# Patient Record
Sex: Female | Born: 1975
Health system: Southern US, Community
[De-identification: ages and names within clinical notes are randomized; demographics above are authoritative.]

## PROBLEM LIST (undated history)

## (undated) DIAGNOSIS — I639 Cerebral infarction, unspecified: Secondary | ICD-10-CM

## (undated) DIAGNOSIS — E119 Type 2 diabetes mellitus without complications: Secondary | ICD-10-CM

## (undated) DIAGNOSIS — R140 Abdominal distension (gaseous): Secondary | ICD-10-CM

## (undated) DIAGNOSIS — N63 Unspecified lump in unspecified breast: Secondary | ICD-10-CM

## (undated) DIAGNOSIS — IMO0002 Reserved for concepts with insufficient information to code with codable children: Secondary | ICD-10-CM

## (undated) DIAGNOSIS — E079 Disorder of thyroid, unspecified: Secondary | ICD-10-CM

## (undated) HISTORY — DX: Disorder of thyroid, unspecified: E07.9

## (undated) HISTORY — DX: Reserved for concepts with insufficient information to code with codable children: IMO0002

## (undated) HISTORY — DX: Abdominal distension (gaseous): R14.0

## (undated) HISTORY — DX: Unspecified lump in unspecified breast: N63.0

## (undated) HISTORY — DX: Type 2 diabetes mellitus without complications: E11.9

---

## 2003-09-04 ENCOUNTER — Inpatient Hospital Stay (HOSPITAL_COMMUNITY): Admission: RE | Admit: 2003-09-04 | Discharge: 2003-09-06 | Payer: Self-pay | Admitting: Obstetrics and Gynecology

## 2004-03-10 ENCOUNTER — Observation Stay (HOSPITAL_COMMUNITY): Admission: EM | Admit: 2004-03-10 | Discharge: 2004-03-11 | Payer: Self-pay | Admitting: Emergency Medicine

## 2004-07-31 ENCOUNTER — Emergency Department (HOSPITAL_COMMUNITY): Admission: EM | Admit: 2004-07-31 | Discharge: 2004-07-31 | Payer: Self-pay | Admitting: Emergency Medicine

## 2007-08-19 ENCOUNTER — Other Ambulatory Visit: Admission: RE | Admit: 2007-08-19 | Discharge: 2007-08-19 | Payer: Self-pay | Admitting: Obstetrics & Gynecology

## 2011-04-05 NOTE — H&P (Signed)
NAME:  Melissa Wilkerson, Melissa Wilkerson                        ACCOUNT NO.:  [id number]   MEDICAL RECORD NO.:  [id number]                   PATIENT TYPE:  INP   LOCATION:  A427                                 FACILITY:  [facility]   PHYSICIAN:  Langley Gauss, M.D.                DATE OF BIRTH:  Jun 12, 1976   DATE OF ADMISSION:  09/04/2003  DATE OF DISCHARGE:                                HISTORY & PHYSICAL   HISTORY OF PRESENT ILLNESS:  This is a 35 year old gravida 2, para 1, at [redacted]  weeks gestation, who presents to [facility] in early stages of  active labor.  Initial examination by Roxine Caddy, R.N., reveals cervix  to be 3-4 cm dilated.   PRENATAL COURSE:  She received prenatal care through [facility].  GTT is normal.  GBS carrier status is negative.  The patient is noted to  have positive antibody screen on her type and Rh.  Note indicates that titer  was performed and was noted to be 1:1 and it appears as though, the chart  indicates it was anti-E, and is not clinically significant.  This was again  repeated during her late second trimester and was unchanged.  She is O  Negative blood type.  She did receive RhoGAM appropriately during the  pregnancy.   ALLERGIES:  No known drug allergies.   CURRENT MEDICATIONS:  Prenatal vitamins.   OB HISTORY:  Vaginal delivery x1 in 1996, 7 pound infant, delivered by  Langley Gauss, M.D. in cross coverage for Richardean Canal, M.D.   PHYSICAL EXAMINATION:  GENERAL:  A pleasant white female.  VITAL SIGNS:  Blood pressure 137/72, pulse 98, respiratory rate 18,  temperature 97.9.  HEENT:  Negative.  No adenopathy.  NECK:  Supple, thyroid is nonpalpable.  LUNGS:  Clear.  CARDIOVASCULAR:  Regular rate and rhythm.  ABDOMEN:  Soft and nontender.  No surgical scars are identified.  She is  vertex presentation by Leopold's maneuver with a fundal height of 40 cm.  EXTREMITIES:  Normal with only trace edema.  PELVIC:  Normal external genitalia.   No lesions or ulcerations identified.  No vaginal bleeding, no leakage of fluid.  The patient admitted in early  stage of labor.  Cervix at 3-4 cm, dilated.  Pelvis noted to be clinically  adequate.  External fetal monitor reveals uterine contractions q3-6 minutes,  palpably mild intensity.  Fetal heart rate is reassuring.    ASSESSMENT:  A 41 week intrauterine pregnancy, in early stages of labor.  The patient is undecided regarding plans for analgesia during labor.  She  would like to try IV narcotics first.  Thus, order is written for Nubain and  Phenergan for first dose upon patient's request.      ___________________________________________  Langley Gauss, M.D.   DC/MEDQ  D:  09/04/2003  T:  09/05/2003  Job:  [id number]   cc:   [facility]

## 2011-04-05 NOTE — H&P (Signed)
NAME:  Melissa Wilkerson, Melissa Wilkerson                        ACCOUNT NO.:  [id number]   MEDICAL RECORD NO.:  [id number]                   PATIENT TYPE:  EMS   LOCATION:  ED                                   FACILITY:  [facility]   PHYSICIAN:  Edward L. Juanetta Gosling, M.D.             DATE OF BIRTH:  Oct 21, 1976   DATE OF ADMISSION:  03/10/2004  DATE OF DISCHARGE:                                HISTORY & PHYSICAL   REASON FOR ADMISSION:  Pyelonephritis.   HISTORY:  Melissa Wilkerson is a 35 year old who was in her usual state of fairly  good health when she developed fever, about 36 hours ago.  She has had a lot  of urinary symptoms for longer than that.  When she came to the emergency  room, she was noted to have a temp of 102.  She was tachycardic.  Her urine  was positive for nitrites and positive for leukocyte esterase.  Her white  count was 22,200, hemoglobin 14.  She had 98% neutrophils.  It was felt that  she had pyelonephritis, and she was admitted because of that.   PAST MEDICAL HISTORY:  Essentially negative.  She had a miscarriage about  two months ago.  She has a history of allergies in the past but no other  significant medical history.  She denies any history of diabetes or other  medical problems.   SOCIAL HISTORY:  She smokes about a half pack to a pack of cigarettes daily.  She works in a Education officer, environmental.   FAMILY HISTORY:  Mother has diabetes and hypertension.  No known history of  any sort of renal problems in the family.   PHYSICAL EXAMINATION:  VITAL SIGNS:  Temp 102.3, pulse 120.  HEENT:  Mucous membranes are dry.  Nose and throat are clear.  LUNGS:  Clear without wheezes, rales, or rhonchi.  HEART:  Regular without murmur, rub or gallop.  ABDOMEN:  Soft without masses.  She actually does not have any percussion  tenderness in the costovertebral angle.   LAB WORK:  Penelope Coop much as mentioned.  Her urine microscopic shows too-  numerous-to-count white blood cells, too-numerous-to-count red  cells, and  many bacteria.   ASSESSMENT:  She has probably pyelonephritis.   PLAN:  Admit for IV treatment.     ___________________________________________                                         Oneal Deputy. Juanetta Gosling, M.D.   ELH/MEDQ  D:  03/10/2004  T:  03/10/2004  Job:  [id number]

## 2011-04-05 NOTE — Discharge Summary (Signed)
NAME:  Melissa Wilkerson, Melissa Wilkerson                        ACCOUNT NO.:  [id number]   MEDICAL RECORD NO.:  [id number]                   PATIENT TYPE:  INP   LOCATION:  A304                                 FACILITY:  [facility]   PHYSICIAN:  Edward L. Juanetta Gosling, M.D.             DATE OF BIRTH:  29-Jan-1976   DATE OF ADMISSION:  03/10/2004  DATE OF DISCHARGE:  03/11/2004                                 DISCHARGE SUMMARY   FINAL DIAGNOSIS:  Pyelonephritis.   HISTORY:  This is a 35 year old who developed fever about 36 hours prior to  admission, but had been having urinary symptoms with dysuria for about a  week prior to that.  When she came to the emergency room she had a  temperature of 102.  She was tachycardic.  Urine was positive for nitrites  and leukocyte esterase.  White count was 22,000 with 98% neutrophils and she  was admitted because of all that.   Exam on admission showed that her temperature was 102.3, pulse 120.  Her  mucous membranes were dry. Chest was clear.  She did not have any percussion  tenderness in the CVT angle.  Urine showed too numerous to count white blood  cells, too numerous to count red cells and many bacteria.   HOSPITAL COURSE:  She was thought to have pyelonephritis and was admitted  for that.  Blood cultures are drawn and are pending.  Urine cultures also  pending now.  She was treated with intravenous Cipro and improved markedly  over the next 24 hours.  Her white count went down to 14,000 and she was  able to eat, keep her food down.  Her mucous membranes moistened up and she  wanted to go home.  She was discharged home in much improved condition on  Cipro 500 mg b.i.d. for 10 more days.  She is going to follow up with Dr.  Sudie Bailey in his office.     ___________________________________________                                         Oneal Deputy. Juanetta Gosling, M.D.   ELH/MEDQ  D:  03/11/2004  T:  03/11/2004  Job:  [id number]

## 2011-04-05 NOTE — Group Therapy Note (Signed)
NAME:  Melissa, Wilkerson                        ACCOUNT NO.:  [id number]   MEDICAL RECORD NO.:  [id number]                   PATIENT TYPE:  INP   LOCATION:  A304                                 FACILITY:  [facility]   PHYSICIAN:  Edward L. Juanetta Gosling, M.D.             DATE OF BIRTH:  1975-12-25   DATE OF PROCEDURE:  DATE OF DISCHARGE:                                   PROGRESS NOTE   PROBLEM:  Pyelonephritis.   SUBJECTIVE:  Melissa Wilkerson says she feels much better and wants to go home.  She has no new complaints.   OBJECTIVE:  Temperature 100.2, pulse 83, respirations 18, blood pressure  116/67.  Chest is clear.  Heart is regular.  Lab work shows that her white  blood count now is down to 14,500 from 22,200 yesterday.  She has gone from  98% neutrophils to 80% neutrophils.   ASSESSMENT:  She is much improved.   PLAN:  Discharge home today.  Please see discharge summary for details.      ___________________________________________                                            Oneal Deputy Juanetta Gosling, M.D.   ELH/MEDQ  D:  03/11/2004  T:  03/11/2004  Job:  [id number]

## 2011-04-05 NOTE — Op Note (Signed)
NAME:  Melissa Wilkerson, TREAT                        ACCOUNT NO.:  [id number]   MEDICAL RECORD NO.:  [id number]                   PATIENT TYPE:  INP   LOCATION:  A427                                 FACILITY:  [facility]   PHYSICIAN:  Langley Gauss, M.D.                DATE OF BIRTH:  [birth year]   DATE OF PROCEDURE:  09/04/2003  DATE OF DISCHARGE:                                 OPERATIVE REPORT   DELIVERY NOTE   DATE OF DELIVERY:  September 04, 2003   DIAGNOSES:  1. A 41-week intrauterine pregnancy in labor.  2. Dysfunctional labor pattern with inadequate frequency and intensity of     uterine contractions requiring Pitocin augmentation.   DELIVERY PERFORMED:  1. Spontaneous assisted vaginal delivery of an 8 pound 8 ounce female.  2. Midline episiotomy repair.   Delivery performed by Dr. Roylene Reason. Lisette Grinder.   ANALGESIA:  The patient received IV Nubain and Phenergan during the course  of labor. She decline placement of an epidural  At the time of delivery, 30  cc of 1% lidocaine plain is utilized in the midline of the perineal body to  facilitate repair.   TOTAL ESTIMATED BLOOD LOSS:  Less than 500 cc.   SPECIMENS:  1. Arterial cord gas and cord blood to pathology laboratory.  2. Placenta is examined and noted to be apparently intake with a 3-vessel     umbilical cord.   DELIVERY PERFORMED:  1. Outlet vacuum extraction utilizing Kiwi vacuum extractor as the patient     was noted to have decelerations to the 80 to 90 beats/minute with the     infant's vertex on the perineal floor.   SUMMARY:  Patient admitted in active labor.  Initial examination 3-4 cm  dilated. The patient received IV Nubain and Phenergan which markedly slowed  the uterine contractions.  By the early a.m. of September 04, 2003 the patient  was examined and now noted to have progressed to 7 cm dilated, 80% effaced,  0 station with the vertex presentation confirmed bulging intact membranes.  Amniotomy is performed with  findings of clear amniotic fluid and fetal scalp  electrode is placed.  The patient is noted to have a reassuring fetal heart  rate at this time.  Subsequently she did have increased frequency and  intensity of uterine contractions, but made slow change throughout the day.  I was contacted by the nursing staff stating that she was 8 cm dilated, and  requesting additional pain medication.   At that point in time, I assessed the patient.  Contractions were palpably  mild and somewhat irregular, thus an intrauterine pressure catheter is  placed to document uterine adequacy.  It was determined that the patient  would benefit from Pitocin augmentation.  Thereafter, the patient was noted  to progress to 9+ cm dilatation; it appeared that the cervix would easily  reduce if the patient would tolerate  this examination.  She was having  significant discomfort associated with the uterine contractions and an  almost uncontrolled urge to push.  Thus, the patient is placed in the dorsal  lithotomy position and prepped and draped in the usual sterile manner.   She began pushing with uterine contractions and it was very easy to reduce  the anterior lip of the cervix over the head of the infant to result in  complete dilatation.  Examination revealed the infant to be in the LOT  position.  With subsequent expulsive efforts spontaneous rotation of the  vertex was noted to occur with descent of the vertex to the perineal floor.  The vertex was noted in a direct OA position.   At this point in time she did begin having the fetal heart rate  decelerations at 80-90 beats/minute.  Thus, with the vertex at a +3 station.  Kiwi vacuum extractor was utilized.  Pressure was kept within the safe gree  range. Suction is applied, and with gentle traction there is very easy  descent of the vertex.  A total of 30 cc of 1% lidocaine is utilized in the  midline of the perineal body.  A small midline episiotomy is  performed.  The  infant is delivered in a direct OA position over this midline episiotomy  without extension.  The mouth and nares were bulb suctioned of clear  amniotic fluid.  Spontaneous rotation then occurred to a right anterior  shoulder position.  Gentle downward traction combined with expulsive efforts  resulted in delivery of this anterior shoulder beneath the pubic symphysis  without difficulty.  The remainder of the infant also delivered without  difficulty.  The umbilical cord is milked towards the infant.  The cord is  doubly clamped, and cut.  The infant is noted to have a spontaneous and  vigorous breathing cry.  Arterial cord gases and cord blood are then  obtained from the placenta.   Gentle traction of the placenta of the placenta results in a separation and  delivery which, upon examination, appears to be an intact placenta with  associated 3-vessel umbilical cord.  Examination of the genital tract  reveals no laceration.  The midline episiotomy is not extended. Excellent  uterine tone is achieved with IV Pitocin solution.   Repair is then performed utilizing #0 chromic in a running lock fashion on  the vaginal mucosa followed by deep interrupted suture of 2-0 Vicryl on the  perineal body to reapproximate the muscular layer followed by a superficial  continuous running layer of #0 chromic to reapproximate the perineal skin.  Both mother and infant doing well following delivery.  The patient is taken  out of the lithotomy position and allowed to bond with the infant.      ___________________________________________                                            Langley Gauss, M.D.   DC/MEDQ  D:  09/04/2003  T:  09/05/2003  Job:  [id number]   cc:   [facility]

## 2014-03-17 ENCOUNTER — Ambulatory Visit (INDEPENDENT_AMBULATORY_CARE_PROVIDER_SITE_OTHER): Payer: Managed Care, Other (non HMO) | Admitting: Orthopedic Surgery

## 2014-03-17 ENCOUNTER — Ambulatory Visit (INDEPENDENT_AMBULATORY_CARE_PROVIDER_SITE_OTHER): Payer: Managed Care, Other (non HMO)

## 2014-03-17 VITALS — BP 146/87 | Ht 61.0 in | Wt 182.0 lb

## 2014-03-17 DIAGNOSIS — M25469 Effusion, unspecified knee: Secondary | ICD-10-CM

## 2014-03-17 DIAGNOSIS — M25569 Pain in unspecified knee: Secondary | ICD-10-CM

## 2014-03-17 DIAGNOSIS — M25561 Pain in right knee: Secondary | ICD-10-CM

## 2014-03-17 NOTE — Progress Notes (Signed)
Patient ID: Melissa Sensingamela Wilkerson, female   DOB: 12/23/1975, 38 y.o.   MRN: [id number]  Chief Complaint  Patient presents with  . Knee Pain    Right knee pain , no injury    38 years old presents with atraumatic onset of right knee pain across the front of the knee more medial than the lateral more patellofemoral than joint line and associated with pain and swelling in the back of the knee and some radiation down the back of the leg from the knee down without any hip or back pain and no trauma. Specifically she complains of pain negotiating steps  Vital signs are stable appearance is normal. She is oriented x3 with normal mood. Her ambulation is without assistive device and no significant limping is noted.  The right knee has a joint effusion fairly large it inhibits motion about 10 limiting her flexion 125 she has full extension ligaments are stable joint lines are nontender she has no crepitance on range of motion in the patellofemoral joint strength is 5 over 5 manual muscle testing are normal skin pulses and perfusion normal sensation right leg negative straight leg raise  X-rays obtained  X-rays show perhaps some mild medial joint space narrowing or in significant on my evaluation.  Impression Encounter Diagnosis  Name Primary?  . Right knee pain Yes   right knee effusion possibility of some arthritis  Aspiration LEFT knee.  Verbal consent was obtained, timeout was completed.  Sterile prep of the LEFT knee.  Lateral approach, suprapatellar  Aspiration of 40 cc of clear, yellow fluid.  Injection of Depo-Medrol 40 mg per cc, 1 cc, and 4 cc 1% plain lidocaine.  No complications.   Plan aspiration injection continue ibuprofen  Followup Call or return to clinic prn if these symptoms worsen or fail to improve as anticipated.

## 2014-03-17 NOTE — Patient Instructions (Signed)
Continue ibuprofen 3 times a day  Ice the knee as needed for swelling  The knee exercises as prescribed followup if no improvement  Knee Exercises EXERCISES RANGE OF MOTION(ROM) AND STRETCHING EXERCISES These exercises may help you when beginning to rehabilitate your injury. Your symptoms may resolve with or without further involvement from your physician, physical therapist or athletic trainer. While completing these exercises, remember:    Restoring tissue flexibility helps normal motion to return to the joints. This allows healthier, less painful movement and activity.   An effective stretch should be held for at least 30 seconds.   A stretch should never be painful. You should only feel a gentle lengthening or release in the stretched tissue.  STRETCH - Knee Extension, Prone  Lie on your stomach on a firm surface, such as a bed or countertop. Place your right / left knee and leg just beyond the edge of the surface. You may wish to place a towel under the far end of your right / left thigh for comfort.   Relax your leg muscles and allow gravity to straighten your knee. Your clinician may advise you to add an ankle weight if more resistance is helpful for you.  You should feel a stretch in the back of your right / left knee.  RANGE OF MOTION - Knee Flexion, Active  Lie on your back with both knees straight. (If this causes back discomfort, bend your opposite knee, placing your foot flat on the floor.)   Slowly slide your heel back toward your buttocks until you feel a gentle stretch in the front of your knee or thigh.     STRETCH - Quadriceps, Prone   Lie on your stomach on a firm surface, such as a bed or padded floor.   Bend your right / left knee and grasp your ankle. If you are unable to reach, your ankle or pant leg, use a belt around your foot to lengthen your reach.   Gently pull your heel toward your buttocks. Your knee should not slide out to the side. You should feel a  stretch in the front of your thigh and/or knee.   STRETCH  Hamstrings, Supine   Lie on your back. Loop a belt or towel over the ball of your right / left foot.   Straighten your right / left knee and slowly pull on the belt to raise your leg. Do not allow the right / left knee to bend. Keep your opposite leg flat on the floor.  Raise the leg until you feel a gentle stretch behind your right / left knee or thigh.  STRENGTHENING EXERCISES These exercises may help you when beginning to rehabilitate your injury. They may resolve your symptoms with or without further involvement from your physician, physical therapist or athletic trainer. While completing these exercises, remember:    Muscles can gain both the endurance and the strength needed for everyday activities through controlled exercises.   Complete these exercises as instructed by your physician, physical therapist or athletic trainer. Progress the resistance and repetitions only as guided.   You may experience muscle soreness or fatigue, but the pain or discomfort you are trying to eliminate should never worsen during these exercises. If this pain does worsen, stop and make certain you are following the directions exactly. If the pain is still present after adjustments, discontinue the exercise until you can discuss the trouble with your clinician.  STRENGTH - Quadriceps, Isometrics  Lie on your back with  your right / left leg extended and your opposite knee bent.   Gradually tense the muscles in the front of your right / left thigh. You should see either your knee cap slide up toward your hip or increased dimpling just above the knee. This motion will push the back of the knee down toward the floor/mat/bed on which you are lying.   STRENGTH - Quadriceps, Short Arcs   Lie on your back. Place a rolled  towel roll under your knee so that the knee slightly bends.   Raise only your lower leg by tightening the muscles in the front of your  thigh. Do not allow your thigh to rise.     STRENGTH - Quadriceps, Straight Leg Raises  Quality counts! Watch for signs that the quadriceps muscle is working to insure you are strengthening the correct muscles and not "cheating" by substituting with healthier muscles.  Lay on your back with your right / left leg extended and your opposite knee bent.   Tense the muscles in the front of your right / left thigh. You should see either your knee cap slide up or increased dimpling just above the knee. Your thigh may even quiver.  Tighten these muscles even more and raise your leg 4 to 6 inches off the floor.   STRENGTH - Hamstring, Curls  Lay on your stomach with your legs extended. (If you lay on a bed, your feet may hang over the edge.)   Tighten the muscles in the back of your thigh to bend your right / left knee up to 90 degrees. Keep your hips flat on the bed/floor.    STRENGTH  Quadriceps, Squats  Stand in a door frame so that your feet and knees are in line with the frame.   Use your hands for balance, not support, on the frame.   Slowly lower your weight, bending at the hips and knees. Keep your lower legs upright so that they are parallel with the door frame. Squat only within the range that does not increase your knee pain. Never let your hips drop below your knees.   Slowly return upright, pushing with your legs, not pulling with your hands.   STRENGTH - Quadriceps, Wall Slides  Follow guidelines for form closely. Increased knee pain often results from poorly placed feet or knees.  Lean against a smooth wall or door and walk your feet out 18-24 inches. Place your feet hip-width apart.   Slowly slide down the wall or door until your knees bend _30________ degrees.* Keep your knees over your heels, not your toes, and in line with your hips, not falling to either side.   * Your physician, physical therapist or athletic trainer will alter this angle based on your symptoms and  progress. Document Released: 09/18/2005 Document Revised: 01/27/2012 Document Reviewed: 02/16/2009 [facility] Patient Information 2013 [place].

## 2015-03-31 ENCOUNTER — Encounter: Payer: Self-pay | Admitting: Adult Health

## 2015-03-31 ENCOUNTER — Ambulatory Visit (INDEPENDENT_AMBULATORY_CARE_PROVIDER_SITE_OTHER): Payer: Commercial Indemnity | Admitting: Adult Health

## 2015-03-31 VITALS — BP 142/76 | HR 84 | Ht 62.0 in | Wt 184.5 lb

## 2015-03-31 DIAGNOSIS — N63 Unspecified lump in unspecified breast: Secondary | ICD-10-CM

## 2015-03-31 HISTORY — DX: Unspecified lump in unspecified breast: N63.0

## 2015-03-31 NOTE — Progress Notes (Signed)
Subjective:     Patient ID: Melissa Wilkerson, female   DOB: 08-26-1976, 39 y.o.   MRN: [id number]  HPI Melissa Wilkerson is a 39 year old white female in complaining of breat lumps in left breasts that seem to come and go and nipple was tender, is better now.No family history of breast lumps or cancer.  Review of Systems + breast lump  Reviewed past medical,surgical, social and family history. Reviewed medications and allergies.     Objective:   Physical Exam BP 142/76 mmHg  Pulse 84  Ht 5\' 2"  (1.575 m)  Wt 184 lb 8 oz (83.689 kg)  BMI 33.74 kg/m2  LMP 03/03/2015   Skin warm and dry,  Breasts:no dominate palpable mass, retraction or nipple discharge on right, on left no nipple discharge or retraction, has pea sized nodule at 12 o'clock 2 FB from areola, non tender, discussed could be cysts since come and go but will get mammogram    Assessment:     Breast nodule, left    Plan:     [facility], left  Breast US if needed   Return in 4 weeks for pap and physical Review handout on breast cysts Decrease caffeine Decrease cigarettes

## 2015-03-31 NOTE — Patient Instructions (Addendum)
Mammogram 5/31 at 9 am  Return in 4 weeks for pap and physical  Breast Cyst A breast cyst is a sac in the breast that is filled with fluid. Breast cysts are common in women. Women can have one or many cysts. When the breasts contain many cysts, it is usually due to a noncancerous (benign) condition called fibrocystic change. These lumps form under the influence of female hormones (estrogen and progesterone). The lumps are most often located in the upper, outer portion of the breast. They are often more swollen, painful, and tender before your period starts. They usually disappear after menopause, unless you are on hormone therapy.  There are several types of cysts:  Macrocyst. This is a cyst that is about 2 in. (5.1 cm) in diameter.   Microcyst. This is a tiny cyst that you cannot feel but can be seen with a mammogram or an ultrasound.   Galactocele. This is a cyst containing milk that may develop if you suddenly stop breastfeeding.   Sebaceous cyst of the skin. This type of cyst is not in the breast tissue itself. Breast cysts do not increase your risk of breast cancer. However, they must be monitored closely because they can be cancerous.  CAUSES  It is not known exactly what causes a breast cyst to form. Possible causes include:  An overgrowth of milk glands and connective tissue in the breast can block the milk glands, causing them to fill with fluid.   Scar tissue in the breast from previous surgery may block the glands, causing a cyst.  RISK FACTORS Estrogen may influence the development of a breast cyst.  SIGNS AND SYMPTOMS   Feeling a smooth, round, soft lump (like a grape) in the breast that is easily moveable.   Breast discomfort or pain.  Increase in size of the lump before your menstrual period and decrease in its size after your menstrual period.  DIAGNOSIS  A cyst can be felt during a physical exam by your health care provider. A breast X-ray exam (mammogram) and  ultrasonography will be done to confirm the diagnosis. Fluid may be removed from the cyst with a needle (fine needle aspiration) to make sure the cyst is not cancerous.  TREATMENT  Treatment may not be necessary. Your health care provider may monitor the cyst to see if it goes away on its own. If treatment is needed, it may include:  Hormone treatment.   Needle aspiration. There is a chance of the cyst coming back after aspiration.   Surgery to remove the whole cyst.  HOME CARE INSTRUCTIONS   Keep all follow-up appointments with your health care provider.  See your health care provider regularly:  Get a yearly exam by your health care provider.  Have a clinical breast exam by a health care provider every 1-3 years if you are 6920-38 years of age. After age 38 years, you should have the exam every year.   Get mammogram tests as directed by your health care provider.   Understand the normal appearance and feel of your breasts and perform breast self-exams.   Only take over-the-counter or prescription medicines as directed by your health care provider.   Wear a supportive bra, especially when exercising.   Avoid caffeine.   Reduce your salt intake, especially before your menstrual period. Too much salt can cause fluid retention, breast swelling, and discomfort.  SEEK MEDICAL CARE IF:   You feel, or think you feel, a lump in your breast.  You notice that both breasts look or feel different than usual.   Your breast is still causing pain after your menstrual period is over.   You need medicine for breast pain and swelling that occurs with your menstrual period.  SEEK IMMEDIATE MEDICAL CARE IF:   You have severe pain, tenderness, redness, or warmth in your breast.   You have nipple discharge or bleeding.   Your breast lump becomes hard and painful.   You find new lumps or bumps that were not there before.   You feel lumps in your armpit (axilla).   You  notice dimpling or wrinkling of the breast or nipple.   You have a fever.  MAKE SURE YOU:  Understand these instructions.  Will watch your condition.  Will get help right away if you are not doing well or get worse. Document Released: 11/04/2005 Document Revised: 07/07/2013 Document Reviewed: 06/03/2013 [facility] Patient Information 2015 [place], [place]. This information is not intended to replace advice given to you by your health care provider. Make sure you discuss any questions you have with your health care provider.

## 2015-04-18 ENCOUNTER — Ambulatory Visit (HOSPITAL_COMMUNITY)
Admission: RE | Admit: 2015-04-18 | Discharge: 2015-04-18 | Disposition: A | Discharge status: Home / Self Care | Payer: Managed Care, Other (non HMO) | Source: Ambulatory Visit | Attending: Adult Health | Admitting: Adult Health

## 2015-04-18 ENCOUNTER — Other Ambulatory Visit: Payer: Self-pay | Admitting: Adult Health

## 2015-04-18 DIAGNOSIS — N63 Unspecified lump in unspecified breast: Secondary | ICD-10-CM

## 2015-04-18 DIAGNOSIS — N6320 Unspecified lump in the left breast, unspecified quadrant: Secondary | ICD-10-CM

## 2015-05-05 ENCOUNTER — Other Ambulatory Visit: Payer: Commercial Indemnity | Admitting: Adult Health

## 2015-05-26 ENCOUNTER — Encounter: Payer: Self-pay | Admitting: Adult Health

## 2015-05-26 ENCOUNTER — Ambulatory Visit (INDEPENDENT_AMBULATORY_CARE_PROVIDER_SITE_OTHER): Payer: Commercial Indemnity | Admitting: Adult Health

## 2015-05-26 ENCOUNTER — Other Ambulatory Visit (HOSPITAL_COMMUNITY)
Admission: RE | Admit: 2015-05-26 | Discharge: 2015-05-26 | Disposition: A | Discharge status: Home / Self Care | Payer: Managed Care, Other (non HMO) | Source: Ambulatory Visit | Attending: Adult Health | Admitting: Adult Health

## 2015-05-26 VITALS — BP 140/72 | HR 92 | Ht 61.0 in | Wt 187.0 lb

## 2015-05-26 DIAGNOSIS — IMO0002 Reserved for concepts with insufficient information to code with codable children: Secondary | ICD-10-CM | POA: Insufficient documentation

## 2015-05-26 DIAGNOSIS — R140 Abdominal distension (gaseous): Secondary | ICD-10-CM

## 2015-05-26 DIAGNOSIS — Z01419 Encounter for gynecological examination (general) (routine) without abnormal findings: Secondary | ICD-10-CM

## 2015-05-26 DIAGNOSIS — Z1151 Encounter for screening for human papillomavirus (HPV): Secondary | ICD-10-CM | POA: Diagnosis present

## 2015-05-26 DIAGNOSIS — N63 Unspecified lump in unspecified breast: Secondary | ICD-10-CM

## 2015-05-26 HISTORY — DX: Abdominal distension (gaseous): R14.0

## 2015-05-26 HISTORY — DX: Reserved for concepts with insufficient information to code with codable children: IMO0002

## 2015-05-26 NOTE — Progress Notes (Signed)
Patient ID: Melissa Wilkerson, female   DOB: 1976-07-02, 39 y.o.   MRN: [id number] History of Present Illness: Melissa Wilkerson is a 39 year old white female in for well woman gyn exam.She has pain with sex at times and has noticed some bloating. She had labs with PCP last week.   Current Medications, Allergies, Past Medical History, Past Surgical History, Family History and Social History were reviewed in [facility] electronic medical record.     Review of Systems: Patient denies any headaches, hearing loss, fatigue, blurred vision, shortness of breath, chest pain, abdominal pain, problems with bowel movements, urination. No joint pain or mood swings. Has bloating and pain with sex at times.   Physical Exam:BP 140/72 mmHg  Pulse 92  Ht 5\' 1"  (1.549 m)  Wt 187 lb (84.823 kg)  BMI 35.35 kg/m2  LMP 05/12/2015 General:  Well developed, well nourished, no acute distress Skin:  Warm and dry Neck:  Midline trachea, normal thyroid, good ROM, no lymphadenopathy Lungs; Clear to auscultation bilaterally Breast:  No dominant palpable mass, retraction, or nipple discharge on right, on left no retraction or nipple discharge, has nodule at 1 0'clock had mammo and US in may and will get F/U in 6 months, thought to be cysts or fibroadenoma Cardiovascular: Regular rate and rhythm Abdomen:  Soft, non tender, no hepatosplenomegaly Pelvic:  External genitalia is normal in appearance, no lesions.  The vagina is normal in appearance. Urethra has no lesions or masses. The cervix is bulbous.Pap with HPV performed.  Uterus is felt to be normal size, shape, and contour.  No adnexal masses or tenderness noted.Bladder is non tender, no masses felt. Extremities/musculoskeletal:  No swelling or varicosities noted, no clubbing or cyanosis Psych:  No mood changes, alert and cooperative,seems happy   Impression: Well woman gyn exam with pap Abdominal bloating Dyspareunia Left breast nodule    Plan: Return in 1 week for  gyn US Physical in 1 year Mammogram in November/december Change positions with sex and try lubricate Review handout on dspareunia

## 2015-05-26 NOTE — Patient Instructions (Signed)
Dyspareunia Dyspareunia is pain during sexual intercourse. It is most common in women, but it also happens in men.  CAUSES  Female The pain from this condition is usually felt when anything is put into the vagina, but any part of the genitals may cause pain during sex. Even sitting or wearing pants can cause pain. Sometimes, a cause cannot be found. Some causes of pain during intercourse are:  Infections of the skin around the vagina.  Vaginal infections, such as a yeast, bacterial, or viral infection.  Vaginismus. This is the inability to have anything put in the vagina even when the woman wants it to happen. There is an automatic muscle contraction and pain. The pain of the muscle contraction can be so severe that intercourse is impossible.  Allergic reaction from spermicides, semen, condoms, scented tampons, soaps, douches, and vaginal sprays.  A fluid-filled sac (cyst) on the Bartholin or Skene glands, located at the opening of the vagina.  Scar tissue in the vagina from a surgically enlarged opening (episiotomy) or tearing after delivering a baby.  Vaginal dryness. This is more common in menopause. The normal secretions of the vagina are decreased. Changes in estrogen levels and increased difficulty becoming aroused can cause painful sex. Vaginal dryness can also happen when taking birth control pills.  Thinning of the tissue (atrophy) of the vulva and vagina. This makes the area thinner, smaller, unable to stretch to accommodate a penis, and prone to infection and tearing.  Vulvar vestibulitis or vestibulodynia.This is a condition that causes pain involving the area around the entrance to the vagina.The most common cause in young women is birth control pills.Women with low estrogen levels (postmenopausal women) may also experience this.Other causes include allergic reactions, too many nerve endings, skin conditions, and pelvic muscles that cannot relax.  Vulvar dermatoses. This  includes skin conditions such as lichen sclerosus and lichen planus.  Lack of foreplay to lubricate the vagina. This can cause vaginal dryness.  Noncancerous tumors (fibroids) in the uterus.  Uterus lining tissue growing outside the uterus (endometriosis).  Pregnancy that starts in the fallopian tube (tubal pregnancy).  Pregnancy or breastfeeding your baby. This can cause vaginal dryness.  A tilting or prolapse of the uterus. Prolapse is when weak and stretched muscles around the uterus allow it to fall into the vagina.  Problems with the ovaries, cysts, or scar tissue. This may be worse with certain sexual positions.  Previous surgeries causing adhesions or scar tissue in the vagina or pelvis.  Bladder and intestinal problems.  Psychological problems (such as depression or anxiety). This may make pain worse.  Negative attitudes about sex, experiencing rape, sexual assault, and misinformation about sex. These issues are often related to some types of pain.  Previous pelvic infection, causing scar tissue in the pelvis and on the female organs.  Cyst or tumor on the ovary.  Cancer of the female organs.  Certain medicines.  Medical problems such as diabetes, arthritis, or thyroid disease. Female In men, there are many physical causes of sexual discomfort. Some causes of pain during intercourse are:  Infections of the prostate, bladder, or seminal vesicles. This can cause pain after ejaculation.  An inflamed bladder (interstitial cystitis). This may cause pain from ejaculation.  Gonorrheal infections. This may cause pain during ejaculation.  An inflamed urethra (urethritis) or inflamed prostate (prostatitis). This can make genital stimulation painful or uncomfortable.  Deformities of the penis, such as Peyronie's disease.  A tight foreskin.  Cancer of the female organs.    Psychological problems. This may make pain worse. DIAGNOSIS   Your caregiver will take a history and  have you describe where the pain is located (outside the vagina, in the vagina, in the pelvis). You may be asked when you experience pain, such as with penetration or with thrusting.  Following this, your caregiver will do a physical exam. Let your caregiver know if the exam is too painful.  During the final part of the female exam, your caregiver will feel your uterus and ovaries with one hand on the abdomen and one finger in your vagina. This is a pelvic exam.  Blood tests, a Pap test, cultures for infection, an ultrasound test, and X-rays may be done. You may need to see a specialist for female problems (gynecologist).  Your caregiver may do a CT scan, MRI, or laparoscopy. Laparoscopy is a procedure to look into the pelvis with a lighted tube, through a cut (incision) in the abdomen. TREATMENT  Your caregiver can help you determine the best course of treatment. Sometimes, more testing is done. Continue with the suggested testing until your caregiver feels sure about your diagnosis and how to treat it. Sometimes, it is difficult to find the reason for the pain. The search for the cause and treatment can be frustrating. Treatment often takes several weeks to a few months before you notice any improvement. You may also need to avoid sexual activity until symptoms improve.Continuing to have sex when it hurts can delay healing and actually make the problem worse. The treatment depends on the cause of the pain. Treatment may include:  Medicines such as antibiotics, vaginal or skin creams, hormones, or antidepressants.  Minor or major surgery.  Psychological counseling or group therapy.  Kegel exercises and vaginal dilators to help certain cases of vaginismus (spasms). Do this only if recommended by your caregiver.Kegel exercises can make some problems worse.  Applying lubrication as recommended by your caregiver if you have dryness.  Sex therapy for you and your sex partner. It is common for  the pain to continue after the reason for the pain has been treated. Some reasons for this include a conditioned response. This means the person having the pain becomes so familiar with the pain that the pain continues as a response, even though the cause is removed. Sex therapy can help with this problem. HOME CARE INSTRUCTIONS   Follow your caregiver's instructions about taking medicines, tests, counseling, and follow-up treatment.  Do not use scented tampons, douches, vaginal sprays, or soaps.  Use water-based lubricants for dryness. Oil lubricants can cause irritation.  Do not use spermicides or condoms that irritate you.  Openly discuss with your partner your sexual experience, your desires, foreplay, and different sexual positions for a more comfortable and enjoyable sexual relationship.  Join group sessions for therapy, if needed.  Practice safe sex at all times.  Empty your bladder before having intercourse.  Try different positions during sexual intercourse.  Take over-the-counter pain medicine recommended by your caregiver before having sexual intercourse.  Do not wear pantyhose. Knee-high and thigh-high hose are okay.  Avoid scrubbing your vulva with a washcloth. Wash the area gently and pat dry with a towel. SEEK MEDICAL CARE IF:   You develop vaginal bleeding after sexual intercourse.  You develop a lump at the opening of your vagina, even if it is not painful.  You have abnormal vaginal discharge.  You have vaginal dryness.  You have itching or irritation of the vulva or vagina.  You   develop a rash or reaction to your medicine. SEEK IMMEDIATE MEDICAL CARE IF:   You develop severe abdominal pain during or shortly after sexual intercourse. You could have a ruptured ovarian cyst or ruptured tubal pregnancy.  You have a fever.  You have painful or bloody urination.  You have painful sexual intercourse, and you never had it before.  You pass out after having  sexual intercourse. Document Released: 11/24/2007 Document Revised: 01/27/2012 Document Reviewed: 02/04/2011 [facility] Patient Information 2015 [place], [place]. This information is not intended to replace advice given to you by your health care provider. Make sure you discuss any questions you have with your health care provider. Return in 1 week for US  Physical in 1 year Mammogram in November/december Will talk with US results

## 2015-05-30 LAB — CYTOLOGY - PAP

## 2015-06-02 ENCOUNTER — Ambulatory Visit (INDEPENDENT_AMBULATORY_CARE_PROVIDER_SITE_OTHER): Payer: Commercial Indemnity

## 2015-06-02 DIAGNOSIS — N941 Dyspareunia: Secondary | ICD-10-CM

## 2015-06-02 DIAGNOSIS — IMO0002 Reserved for concepts with insufficient information to code with codable children: Secondary | ICD-10-CM

## 2015-06-02 DIAGNOSIS — R140 Abdominal distension (gaseous): Secondary | ICD-10-CM | POA: Diagnosis not present

## 2015-06-02 NOTE — Progress Notes (Signed)
US PELVIS TA/TV: normal anteverted uterus,EEC 11.287mm,normal ov's bilat (mobile),no free fluid,no pain during ultrasound

## 2015-06-07 ENCOUNTER — Telehealth: Payer: Self-pay | Admitting: Adult Health

## 2015-06-07 NOTE — Telephone Encounter (Signed)
Left message US was normal 

## 2018-02-12 DIAGNOSIS — Z0000 Encounter for general adult medical examination without abnormal findings: Secondary | ICD-10-CM | POA: Diagnosis not present

## 2018-02-12 DIAGNOSIS — R5383 Other fatigue: Secondary | ICD-10-CM | POA: Diagnosis not present

## 2018-02-12 DIAGNOSIS — E559 Vitamin D deficiency, unspecified: Secondary | ICD-10-CM | POA: Diagnosis not present

## 2018-02-12 DIAGNOSIS — E785 Hyperlipidemia, unspecified: Secondary | ICD-10-CM | POA: Diagnosis not present

## 2018-02-12 DIAGNOSIS — I10 Essential (primary) hypertension: Secondary | ICD-10-CM | POA: Diagnosis not present

## 2018-02-12 DIAGNOSIS — E039 Hypothyroidism, unspecified: Secondary | ICD-10-CM | POA: Diagnosis not present

## 2018-03-17 DIAGNOSIS — E039 Hypothyroidism, unspecified: Secondary | ICD-10-CM | POA: Diagnosis not present

## 2018-05-26 DIAGNOSIS — E785 Hyperlipidemia, unspecified: Secondary | ICD-10-CM | POA: Diagnosis not present

## 2018-05-26 DIAGNOSIS — I10 Essential (primary) hypertension: Secondary | ICD-10-CM | POA: Diagnosis not present

## 2018-05-26 DIAGNOSIS — E039 Hypothyroidism, unspecified: Secondary | ICD-10-CM | POA: Diagnosis not present

## 2018-10-06 DIAGNOSIS — I10 Essential (primary) hypertension: Secondary | ICD-10-CM | POA: Diagnosis not present

## 2018-10-06 DIAGNOSIS — E039 Hypothyroidism, unspecified: Secondary | ICD-10-CM | POA: Diagnosis not present

## 2018-10-06 DIAGNOSIS — E559 Vitamin D deficiency, unspecified: Secondary | ICD-10-CM | POA: Diagnosis not present

## 2018-10-06 DIAGNOSIS — F17210 Nicotine dependence, cigarettes, uncomplicated: Secondary | ICD-10-CM | POA: Diagnosis not present

## 2018-10-06 DIAGNOSIS — E785 Hyperlipidemia, unspecified: Secondary | ICD-10-CM | POA: Diagnosis not present

## 2018-10-06 LAB — HEPATIC FUNCTION PANEL
BILIRUBIN DIRECT: 0.3 (ref 0.01–0.4)
Bilirubin, Total: 78

## 2018-10-06 LAB — BASIC METABOLIC PANEL
Glucose: 117
Potassium: 4.2 (ref 3.4–5.3)
Sodium: 138 (ref 137–147)

## 2018-10-06 LAB — TSH: TSH: 1.57 (ref 0.41–5.90)

## 2019-01-06 DIAGNOSIS — E039 Hypothyroidism, unspecified: Secondary | ICD-10-CM | POA: Diagnosis not present

## 2019-01-06 DIAGNOSIS — E669 Obesity, unspecified: Secondary | ICD-10-CM | POA: Diagnosis not present

## 2019-01-06 DIAGNOSIS — I10 Essential (primary) hypertension: Secondary | ICD-10-CM | POA: Diagnosis not present

## 2019-01-06 DIAGNOSIS — E559 Vitamin D deficiency, unspecified: Secondary | ICD-10-CM | POA: Diagnosis not present

## 2019-01-21 ENCOUNTER — Encounter (INDEPENDENT_AMBULATORY_CARE_PROVIDER_SITE_OTHER): Payer: Self-pay | Admitting: Nurse Practitioner

## 2019-02-11 ENCOUNTER — Encounter (INDEPENDENT_AMBULATORY_CARE_PROVIDER_SITE_OTHER): Payer: Self-pay | Admitting: Nurse Practitioner

## 2019-02-18 ENCOUNTER — Ambulatory Visit (INDEPENDENT_AMBULATORY_CARE_PROVIDER_SITE_OTHER): Payer: Managed Care, Other (non HMO) | Admitting: Nurse Practitioner

## 2019-03-01 DIAGNOSIS — F17210 Nicotine dependence, cigarettes, uncomplicated: Secondary | ICD-10-CM | POA: Diagnosis not present

## 2019-03-01 DIAGNOSIS — E559 Vitamin D deficiency, unspecified: Secondary | ICD-10-CM | POA: Diagnosis not present

## 2019-03-01 DIAGNOSIS — E669 Obesity, unspecified: Secondary | ICD-10-CM | POA: Diagnosis not present

## 2019-03-01 DIAGNOSIS — I10 Essential (primary) hypertension: Secondary | ICD-10-CM | POA: Diagnosis not present

## 2019-03-23 DIAGNOSIS — R5383 Other fatigue: Secondary | ICD-10-CM | POA: Diagnosis not present

## 2019-03-23 DIAGNOSIS — E039 Hypothyroidism, unspecified: Secondary | ICD-10-CM | POA: Diagnosis not present

## 2019-03-23 DIAGNOSIS — E559 Vitamin D deficiency, unspecified: Secondary | ICD-10-CM | POA: Diagnosis not present

## 2019-03-23 DIAGNOSIS — I10 Essential (primary) hypertension: Secondary | ICD-10-CM | POA: Diagnosis not present

## 2019-03-30 DIAGNOSIS — E559 Vitamin D deficiency, unspecified: Secondary | ICD-10-CM | POA: Diagnosis not present

## 2019-03-30 DIAGNOSIS — E039 Hypothyroidism, unspecified: Secondary | ICD-10-CM | POA: Diagnosis not present

## 2019-04-13 ENCOUNTER — Other Ambulatory Visit (HOSPITAL_COMMUNITY): Payer: Self-pay | Admitting: Nurse Practitioner

## 2019-04-13 DIAGNOSIS — IMO0002 Reserved for concepts with insufficient information to code with codable children: Secondary | ICD-10-CM

## 2019-05-04 ENCOUNTER — Ambulatory Visit (HOSPITAL_COMMUNITY): Payer: BC Managed Care – PPO

## 2019-05-18 ENCOUNTER — Other Ambulatory Visit: Payer: Self-pay

## 2019-05-18 ENCOUNTER — Ambulatory Visit (HOSPITAL_COMMUNITY)
Admission: RE | Admit: 2019-05-18 | Discharge: 2019-05-18 | Disposition: A | Discharge status: Home / Self Care | Payer: BC Managed Care – PPO | Source: Ambulatory Visit | Attending: Nurse Practitioner | Admitting: Nurse Practitioner

## 2019-05-18 ENCOUNTER — Ambulatory Visit (HOSPITAL_COMMUNITY): Payer: BC Managed Care – PPO

## 2019-05-18 DIAGNOSIS — IMO0002 Reserved for concepts with insufficient information to code with codable children: Secondary | ICD-10-CM

## 2019-05-18 DIAGNOSIS — R229 Localized swelling, mass and lump, unspecified: Secondary | ICD-10-CM | POA: Diagnosis not present

## 2019-05-18 DIAGNOSIS — N6002 Solitary cyst of left breast: Secondary | ICD-10-CM | POA: Diagnosis not present

## 2019-05-18 DIAGNOSIS — R922 Inconclusive mammogram: Secondary | ICD-10-CM | POA: Diagnosis not present

## 2019-05-18 DIAGNOSIS — N6012 Diffuse cystic mastopathy of left breast: Secondary | ICD-10-CM | POA: Diagnosis not present

## 2019-06-23 DIAGNOSIS — E559 Vitamin D deficiency, unspecified: Secondary | ICD-10-CM | POA: Diagnosis not present

## 2019-06-23 DIAGNOSIS — E785 Hyperlipidemia, unspecified: Secondary | ICD-10-CM | POA: Diagnosis not present

## 2019-06-23 DIAGNOSIS — R5383 Other fatigue: Secondary | ICD-10-CM | POA: Diagnosis not present

## 2019-06-23 DIAGNOSIS — I10 Essential (primary) hypertension: Secondary | ICD-10-CM | POA: Diagnosis not present

## 2019-06-23 DIAGNOSIS — E039 Hypothyroidism, unspecified: Secondary | ICD-10-CM | POA: Diagnosis not present

## 2019-06-23 DIAGNOSIS — Z0000 Encounter for general adult medical examination without abnormal findings: Secondary | ICD-10-CM | POA: Diagnosis not present

## 2019-09-09 ENCOUNTER — Other Ambulatory Visit (INDEPENDENT_AMBULATORY_CARE_PROVIDER_SITE_OTHER): Payer: Self-pay | Admitting: Nurse Practitioner

## 2019-09-09 ENCOUNTER — Other Ambulatory Visit (INDEPENDENT_AMBULATORY_CARE_PROVIDER_SITE_OTHER): Payer: Self-pay | Admitting: Internal Medicine

## 2019-09-28 ENCOUNTER — Other Ambulatory Visit: Payer: Self-pay

## 2019-09-28 ENCOUNTER — Encounter (INDEPENDENT_AMBULATORY_CARE_PROVIDER_SITE_OTHER): Payer: Self-pay | Admitting: Internal Medicine

## 2019-09-28 ENCOUNTER — Ambulatory Visit (INDEPENDENT_AMBULATORY_CARE_PROVIDER_SITE_OTHER): Payer: BC Managed Care – PPO | Admitting: Internal Medicine

## 2019-09-28 VITALS — BP 132/78 | HR 80 | Temp 97.9°F | Resp 18 | Ht 62.0 in | Wt 170.0 lb

## 2019-09-28 DIAGNOSIS — E039 Hypothyroidism, unspecified: Secondary | ICD-10-CM | POA: Diagnosis not present

## 2019-09-28 DIAGNOSIS — D72829 Elevated white blood cell count, unspecified: Secondary | ICD-10-CM

## 2019-09-28 DIAGNOSIS — E782 Mixed hyperlipidemia: Secondary | ICD-10-CM

## 2019-09-28 DIAGNOSIS — E559 Vitamin D deficiency, unspecified: Secondary | ICD-10-CM | POA: Diagnosis not present

## 2019-09-28 DIAGNOSIS — I10 Essential (primary) hypertension: Secondary | ICD-10-CM

## 2019-09-28 DIAGNOSIS — E785 Hyperlipidemia, unspecified: Secondary | ICD-10-CM | POA: Insufficient documentation

## 2019-09-28 HISTORY — DX: Vitamin D deficiency, unspecified: E55.9

## 2019-09-28 HISTORY — DX: Hyperlipidemia, unspecified: E78.5

## 2019-09-28 HISTORY — DX: Essential (primary) hypertension: I10

## 2019-09-28 HISTORY — DX: Hypothyroidism, unspecified: E03.9

## 2019-09-28 NOTE — Progress Notes (Signed)
Metrics: Intervention Frequency ACO  Documented Smoking Status Yearly  Screened one or more times in 24 months  Cessation Counseling or  Active cessation medication Past 24 months  Past 24 months   Guideline developer: UpToDate (See UpToDate for funding source) Date Released: 2014       Wellness Office Visit  Subjective:  Patient ID: Melissa Wilkerson, female    DOB: [birth year]  Age: 43 y.o. MRN: [id number]  CC: This lady comes in for follow-up of hypothyroidism, hypertension, vitamin D deficiency. HPI  Overall she is doing well and her blood tests on the last visit in August when she had a physical were in a good range. However, I have always been concerned about a mild leukocytosis that she has had for some time and it does not increase and she has no symptoms that make me worried about acute leukemia.  However, I think we need to investigate this further again. As far as her hypertension is concerned, she is compliant with taking lisinopril.  She denies a cough.  There is no chest pain, dyspnea, palpitations or limb weakness. She continues with desiccated thyroid as before and her free T3 was in the 4.1 range last time I believe. She continues vitamin D3 supplementation for vitamin D deficiency also without any problems. Past Medical History:  Diagnosis Date  . Abdominal bloating 05/26/2015  . Breast nodule 03/31/2015  . Dyspareunia 05/26/2015  . Essential hypertension, benign 09/28/2019  . HLD (hyperlipidemia) 09/28/2019  . Hypothyroidism, adult 09/28/2019  . Vitamin D deficiency disease 09/28/2019      Family History  Problem Relation Age of Onset  . Cancer Mother        lung  . Diabetes Mother   . Hypertension Mother   . Cancer Father        throat, tongue  . Diabetes Maternal Grandmother   . Hypertension Maternal Grandmother   . Cancer Paternal Grandfather     Social History   Social History Narrative   Married for 15 years.Works at [employer] shift.    Social History   Tobacco Use  . Smoking status: Current Every Day Smoker    Packs/day: 1.00    Years: 17.00    Pack years: 17.00    Types: Cigarettes  . Smokeless tobacco: Never Used  Substance Use Topics  . Alcohol use: No    Alcohol/week: 0.0 standard drinks    Current Meds  Medication Sig  . Cholecalciferol (VITAMIN D3) 1.25 MG (50000 UT) CAPS Take 2 capsules by mouth daily.   Marland Kitchen lisinopril (ZESTRIL) 5 MG tablet Take 1 tablet by mouth once daily  . NP THYROID 120 MG tablet Take 1 tablet by mouth once daily       Objective:   Today's Vitals: BP 132/78 (BP Location: Right Arm, Patient Position: Sitting, Cuff Size: Normal)   Pulse 80   Temp 97.9 F (36.6 C) (Temporal)   Resp 18   Ht 5\' 2"  (1.575 m)   Wt 170 lb (77.1 kg)   SpO2 96% Comment: with mask on  BMI 31.09 kg/m  Vitals with BMI 09/28/2019 05/26/2015 05/26/2015  Height 5\' 2"  - 5\' 1"   Weight 170 lbs - 187 lbs  BMI 27.03 - 50.0  Systolic 938 182 993  Diastolic 78 72 80  Pulse 80 - 92     Physical Exam       Assessment   1. Leukocytosis, unspecified type   2. Essential hypertension, benign   3. Hypothyroidism, adult  4. Vitamin D deficiency disease   5. Mixed hyperlipidemia       Tests ordered Orders Placed This Encounter  Procedures  . CBC with Differential/Platelets     Plan: 1. I will check a CBC with differential today and most likely refer her to hematology for further evaluation. 2. She will continue with antihypertensive therapy which seems to be controlling her blood pressure and this is stable. 3. She will continue also with desiccated thyroid and I do not think we need to check levels today of her thyroid. 4. She will also continue with vitamin D3 supplementation for vitamin D deficiency. 5. Thankfully, she does not need statin therapy for mild hyperlipidemia. 6. Further recommendations will depend on the blood tests and I will see her in about 4 months time for follow-up.  She was  given Tdap vaccination today.   No orders of the defined types were placed in this encounter.   Wilson Singer, MD

## 2019-09-29 ENCOUNTER — Encounter (INDEPENDENT_AMBULATORY_CARE_PROVIDER_SITE_OTHER): Payer: Self-pay | Admitting: Internal Medicine

## 2019-09-29 LAB — CBC WITH DIFFERENTIAL/PLATELET
Absolute Monocytes: 713 cells/uL (ref 200–950)
Basophils Absolute: 89 cells/uL (ref 0–200)
Basophils Relative: 0.9 %
Eosinophils Absolute: 327 cells/uL (ref 15–500)
Eosinophils Relative: 3.3 %
HCT: 44 % (ref 35.0–45.0)
Hemoglobin: 14.6 g/dL (ref 11.7–15.5)
Lymphs Abs: 2267 cells/uL (ref 850–3900)
MCH: 31.1 pg (ref 27.0–33.0)
MCHC: 33.2 g/dL (ref 32.0–36.0)
MCV: 93.8 fL (ref 80.0–100.0)
MPV: 10.4 fL (ref 7.5–12.5)
Monocytes Relative: 7.2 %
Neutro Abs: 6504 cells/uL (ref 1500–7800)
Neutrophils Relative %: 65.7 %
Platelets: 311 10*3/uL (ref 140–400)
RBC: 4.69 10*6/uL (ref 3.80–5.10)
RDW: 12.5 % (ref 11.0–15.0)
Total Lymphocyte: 22.9 %
WBC: 9.9 10*3/uL (ref 3.8–10.8)

## 2019-12-23 ENCOUNTER — Other Ambulatory Visit (INDEPENDENT_AMBULATORY_CARE_PROVIDER_SITE_OTHER): Payer: Self-pay | Admitting: Internal Medicine

## 2019-12-23 DIAGNOSIS — I10 Essential (primary) hypertension: Secondary | ICD-10-CM

## 2019-12-23 NOTE — Telephone Encounter (Signed)
Refill sent.

## 2020-01-26 ENCOUNTER — Ambulatory Visit (INDEPENDENT_AMBULATORY_CARE_PROVIDER_SITE_OTHER): Payer: BC Managed Care – PPO | Admitting: Internal Medicine

## 2020-02-09 ENCOUNTER — Other Ambulatory Visit: Payer: Self-pay

## 2020-02-09 ENCOUNTER — Ambulatory Visit (INDEPENDENT_AMBULATORY_CARE_PROVIDER_SITE_OTHER): Payer: BC Managed Care – PPO | Admitting: Internal Medicine

## 2020-02-09 ENCOUNTER — Encounter (INDEPENDENT_AMBULATORY_CARE_PROVIDER_SITE_OTHER): Payer: Self-pay | Admitting: Internal Medicine

## 2020-02-09 VITALS — BP 125/75 | HR 96 | Temp 97.5°F | Ht 61.0 in | Wt 171.4 lb

## 2020-02-09 DIAGNOSIS — I10 Essential (primary) hypertension: Secondary | ICD-10-CM

## 2020-02-09 DIAGNOSIS — E559 Vitamin D deficiency, unspecified: Secondary | ICD-10-CM | POA: Diagnosis not present

## 2020-02-09 DIAGNOSIS — E039 Hypothyroidism, unspecified: Secondary | ICD-10-CM | POA: Diagnosis not present

## 2020-02-09 NOTE — Progress Notes (Signed)
Metrics: Intervention Frequency ACO  Documented Smoking Status Yearly  Screened one or more times in 24 months  Cessation Counseling or  Active cessation medication Past 24 months  Past 24 months   Guideline developer: UpToDate (See UpToDate for funding source) Date Released: 2014       Wellness Office Visit  Subjective:  Patient ID: Melissa Wilkerson, female    DOB: October 06, 1976  Age: 44 y.o. MRN: [id number]  CC: This lady comes in for follow-up of hypertension, hypothyroidism and vitamin D deficiency. HPI  She is compliant with ACE inhibitor for her hypertension.  She denies any chest pain, dyspnea or palpitations. She also continues with desiccated NP thyroid for her hypothyroidism.  We have not done blood work in some time. She continues on vitamin D3 supplementation for vitamin D deficiency.  Past Medical History:  Diagnosis Date  . Abdominal bloating 05/26/2015  . Breast nodule 03/31/2015  . Dyspareunia 05/26/2015  . Essential hypertension, benign 09/28/2019  . HLD (hyperlipidemia) 09/28/2019  . Hypothyroidism, adult 09/28/2019  . Vitamin D deficiency disease 09/28/2019      Family History  Problem Relation Age of Onset  . Cancer Mother        lung  . Diabetes Mother   . Hypertension Mother   . Cancer Father        throat, tongue  . Diabetes Maternal Grandmother   . Hypertension Maternal Grandmother   . Cancer Paternal Grandfather     Social History   Social History Narrative   Married for 15 years.Works at [employer] shift.   Social History   Tobacco Use  . Smoking status: Current Every Day Smoker    Packs/day: 1.00    Years: 17.00    Pack years: 17.00    Types: Cigarettes  . Smokeless tobacco: Never Used  Substance Use Topics  . Alcohol use: No    Alcohol/week: 0.0 standard drinks    Current Meds  Medication Sig  . Cholecalciferol (VITAMIN D3) 1.25 MG (50000 UT) CAPS Take 2 capsules by mouth daily.   Marland Kitchen lisinopril (ZESTRIL) 5 MG tablet  Take 1 tablet by mouth once daily  . NP THYROID 120 MG tablet Take 1 tablet by mouth once daily      Objective:   Today's Vitals: BP 125/75 (BP Location: Left Arm, Patient Position: Sitting, Cuff Size: Normal)   Pulse 96   Temp (!) 97.5 F (36.4 C) (Temporal)   Ht 5\' 1"  (1.549 m)   Wt 171 lb 6.4 oz (77.7 kg)   SpO2 (!) 79%   BMI 32.39 kg/m  Vitals with BMI 02/09/2020 09/28/2019 05/26/2015  Height 5\' 1"  5\' 2"  -  Weight 171 lbs 6 oz 170 lbs -  BMI 32.4 31.09 -  Systolic 125 132 07/27/2015  Diastolic 75 78 72  Pulse 96 80 -     Physical Exam   She looks systemically well.  Weight is stable.  Blood pressure is under good control.  She is alert and orientated without any focal neurologic signs.    Assessment   1. Essential hypertension, benign   2. Hypothyroidism, adult   3. Vitamin D deficiency disease       Tests ordered Orders Placed This Encounter  Procedures  . COMPLETE METABOLIC PANEL WITH GFR  . VITAMIN D 25 Hydroxy (Vit-D Deficiency, Fractures)  . T3, free     Plan: 1. Blood work is ordered above. 2. She will continue with her ACE inhibitor for her hypertension which is  keeping the blood pressure under reasonable control. 3. She will continue with desiccated NP thyroid and we will check to see what her T3 levels are. 4. She will continue with vitamin D3 supplementation I will check levels today. 5. Further recommendations will depend on blood results and I will see her in 3 months time for follow-up   No orders of the defined types were placed in this encounter.   Doree Albee, MD

## 2020-02-10 LAB — COMPLETE METABOLIC PANEL WITH GFR
AG Ratio: 1.8 (calc) (ref 1.0–2.5)
ALT: 11 U/L (ref 6–29)
AST: 8 U/L — ABNORMAL LOW (ref 10–30)
Albumin: 4.3 g/dL (ref 3.6–5.1)
Alkaline phosphatase (APISO): 78 U/L (ref 31–125)
BUN: 14 mg/dL (ref 7–25)
CO2: 25 mmol/L (ref 20–32)
Calcium: 9.3 mg/dL (ref 8.6–10.2)
Chloride: 103 mmol/L (ref 98–110)
Creat: 0.68 mg/dL (ref 0.50–1.10)
GFR, Est African American: 125 mL/min/{1.73_m2} (ref >=60)
GFR, Est Non African American: 108 mL/min/{1.73_m2} (ref >=60)
Globulin: 2.4 g/dL (calc) (ref 1.9–3.7)
Glucose, Bld: 128 mg/dL — ABNORMAL HIGH (ref 65–99)
Potassium: 4.3 mmol/L (ref 3.5–5.3)
Sodium: 137 mmol/L (ref 135–146)
Total Bilirubin: 0.4 mg/dL (ref 0.2–1.2)
Total Protein: 6.7 g/dL (ref 6.1–8.1)

## 2020-02-10 LAB — VITAMIN D 25 HYDROXY (VIT D DEFICIENCY, FRACTURES): Vit D, 25-Hydroxy: 52 ng/mL (ref 30–100)

## 2020-02-10 LAB — T3, FREE: T3, Free: 3.6 pg/mL (ref 2.3–4.2)

## 2020-02-17 ENCOUNTER — Other Ambulatory Visit (INDEPENDENT_AMBULATORY_CARE_PROVIDER_SITE_OTHER): Payer: Self-pay | Admitting: Internal Medicine

## 2020-04-14 ENCOUNTER — Other Ambulatory Visit (INDEPENDENT_AMBULATORY_CARE_PROVIDER_SITE_OTHER): Payer: Self-pay | Admitting: Nurse Practitioner

## 2020-04-14 DIAGNOSIS — I10 Essential (primary) hypertension: Secondary | ICD-10-CM

## 2020-05-15 ENCOUNTER — Encounter (INDEPENDENT_AMBULATORY_CARE_PROVIDER_SITE_OTHER): Payer: Self-pay | Admitting: Internal Medicine

## 2020-05-15 ENCOUNTER — Ambulatory Visit (INDEPENDENT_AMBULATORY_CARE_PROVIDER_SITE_OTHER): Payer: BC Managed Care – PPO | Admitting: Internal Medicine

## 2020-05-15 ENCOUNTER — Other Ambulatory Visit: Payer: Self-pay

## 2020-05-15 VITALS — BP 139/60 | HR 81 | Temp 97.2°F | Ht 61.0 in | Wt 173.4 lb

## 2020-05-15 DIAGNOSIS — E039 Hypothyroidism, unspecified: Secondary | ICD-10-CM

## 2020-05-15 DIAGNOSIS — E559 Vitamin D deficiency, unspecified: Secondary | ICD-10-CM

## 2020-05-15 DIAGNOSIS — I10 Essential (primary) hypertension: Secondary | ICD-10-CM | POA: Diagnosis not present

## 2020-05-15 NOTE — Progress Notes (Signed)
Metrics: Intervention Frequency ACO  Documented Smoking Status Yearly  Screened one or more times in 24 months  Cessation Counseling or  Active cessation medication Past 24 months  Past 24 months   Guideline developer: UpToDate (See UpToDate for funding source) Date Released: 2014       Wellness Office Visit  Subjective:  Patient ID: Melissa Wilkerson, female    DOB: [birth year]  Age: 44 y.o. MRN: [id number]  CC: This lady comes in for follow-up of hypertension, hypothyroidism and vitamin D deficiency. HPI  Unfortunately, she still continues to smoke cigarettes and we have had several conversations in the past regarding smoking cessation. She continues on lisinopril for hypertension.  She denies any chest pain, dyspnea or palpitations. She continues on NP thyroid for hypothyroidism and last time we check T3 levels, they were reasonable. She also continues on vitamin D3 supplementation for vitamin D deficiency. Past Medical History:  Diagnosis Date  . Abdominal bloating 05/26/2015  . Breast nodule 03/31/2015  . Dyspareunia 05/26/2015  . Essential hypertension, benign 09/28/2019  . HLD (hyperlipidemia) 09/28/2019  . Hypothyroidism, adult 09/28/2019  . Vitamin D deficiency disease 09/28/2019   History reviewed. No pertinent surgical history.   Family History  Problem Relation Age of Onset  . Cancer Mother        lung  . Diabetes Mother   . Hypertension Mother   . Cancer Father        throat, tongue  . Diabetes Maternal Grandmother   . Hypertension Maternal Grandmother   . Cancer Paternal Grandfather     Social History   Social History Narrative   Married for 15 years.Works at [employer] shift.   Social History   Tobacco Use  . Smoking status: Current Every Day Smoker    Packs/day: 1.00    Years: 17.00    Pack years: 17.00    Types: Cigarettes  . Smokeless tobacco: Never Used  Substance Use Topics  . Alcohol use: No    Alcohol/week: 0.0 standard drinks     Current Meds  Medication Sig  . Cholecalciferol (VITAMIN D3) 1.25 MG (50000 UT) CAPS Take 2 capsules by mouth daily.   Marland Kitchen lisinopril (ZESTRIL) 5 MG tablet Take 1 tablet by mouth once daily  . NP THYROID 120 MG tablet Take 1 tablet by mouth once daily      Depression screen [facility] 2/9 02/09/2020 09/28/2019  Decreased Interest 0 0  Down, Depressed, Hopeless 0 0  PHQ - 2 Score 0 0     Objective:   Today's Vitals: BP 139/60 (BP Location: Left Arm, Patient Position: Sitting, Cuff Size: Normal)   Pulse 81   Temp (!) 97.2 F (36.2 C) (Temporal)   Ht 5\' 1"  (1.549 m)   Wt 173 lb 6.4 oz (78.7 kg)   SpO2 95%   BMI 32.76 kg/m  Vitals with BMI 05/15/2020 02/09/2020 09/28/2019  Height 5\' 1"  5\' 1"  5\' 2"   Weight 173 lbs 6 oz 171 lbs 6 oz 170 lbs  BMI 32.78 60.7 37.10  Systolic 626 948 546  Diastolic 60 75 78  Pulse 81 96 80     Physical Exam   She looks systemically well.  Blood pressure is well controlled.  Weight is stable.  She is alert and orientated without any focal neurological signs.    Assessment   1. Hypothyroidism, adult   2. Essential hypertension, benign   3. Vitamin D deficiency disease       Tests ordered No orders  of the defined types were placed in this encounter.    Plan: 1. She will continue with desiccated NP thyroid for hypothyroidism.  This appears to be well controlled at the present time. 2. She will continue with lisinopril for hypertension and her blood pressure is controlled. 3. She will continue with vitamin D3 supplementation for vitamin D deficiency.  Her last vitamin D levels were reasonable. 4. I will see her in about 4 months time for an annual physical exam.   No orders of the defined types were placed in this encounter.   Wilson Singer, MD

## 2020-07-05 ENCOUNTER — Other Ambulatory Visit (INDEPENDENT_AMBULATORY_CARE_PROVIDER_SITE_OTHER): Payer: Self-pay

## 2020-07-05 MED ORDER — NP THYROID 120 MG PO TABS: 120.0000 mg | ORAL_TABLET | Freq: Every day | ORAL | 3 refills | Status: DC

## 2020-07-27 ENCOUNTER — Other Ambulatory Visit (INDEPENDENT_AMBULATORY_CARE_PROVIDER_SITE_OTHER): Payer: Self-pay | Admitting: Internal Medicine

## 2020-07-27 DIAGNOSIS — I10 Essential (primary) hypertension: Secondary | ICD-10-CM

## 2020-09-14 ENCOUNTER — Encounter (INDEPENDENT_AMBULATORY_CARE_PROVIDER_SITE_OTHER): Payer: BC Managed Care – PPO | Admitting: Internal Medicine

## 2020-09-20 ENCOUNTER — Encounter (INDEPENDENT_AMBULATORY_CARE_PROVIDER_SITE_OTHER): Payer: Self-pay | Admitting: Nurse Practitioner

## 2020-09-20 ENCOUNTER — Ambulatory Visit (INDEPENDENT_AMBULATORY_CARE_PROVIDER_SITE_OTHER): Payer: BC Managed Care – PPO | Admitting: Nurse Practitioner

## 2020-09-20 ENCOUNTER — Other Ambulatory Visit: Payer: Self-pay

## 2020-09-20 VITALS — BP 142/84 | HR 87 | Temp 97.3°F | Ht 60.3 in | Wt 172.0 lb

## 2020-09-20 DIAGNOSIS — Z124 Encounter for screening for malignant neoplasm of cervix: Secondary | ICD-10-CM

## 2020-09-20 DIAGNOSIS — E782 Mixed hyperlipidemia: Secondary | ICD-10-CM

## 2020-09-20 DIAGNOSIS — I10 Essential (primary) hypertension: Secondary | ICD-10-CM | POA: Diagnosis not present

## 2020-09-20 DIAGNOSIS — E039 Hypothyroidism, unspecified: Secondary | ICD-10-CM | POA: Diagnosis not present

## 2020-09-20 DIAGNOSIS — E669 Obesity, unspecified: Secondary | ICD-10-CM

## 2020-09-20 DIAGNOSIS — Z0001 Encounter for general adult medical examination with abnormal findings: Secondary | ICD-10-CM | POA: Diagnosis not present

## 2020-09-20 DIAGNOSIS — Z131 Encounter for screening for diabetes mellitus: Secondary | ICD-10-CM

## 2020-09-20 DIAGNOSIS — F17210 Nicotine dependence, cigarettes, uncomplicated: Secondary | ICD-10-CM

## 2020-09-20 NOTE — Progress Notes (Signed)
Subjective:  Patient ID: Melissa Wilkerson, female    DOB: [birth year]  Age: 44 y.o. MRN: [id number]  CC:  Chief Complaint  Patient presents with  . Annual Exam    Doing okay      HPI  This patient arrives today for the above.  She is here for annual exam.  She would be due for flu shot and COVID-19 vaccine series, she does not wish to have either of these vaccines administered at this time.  As far as screenings are concerned she is due for cervical cancer screening, sexually-transmitted flexion screening, depression screening, and hepatitis C screening.  She is also a current tobacco smoker.  She tells me she is not ready to quit at this time but is contemplating it.  Past Medical History:  Diagnosis Date  . Abdominal bloating 05/26/2015  . Breast nodule 03/31/2015  . Dyspareunia 05/26/2015  . Essential hypertension, benign 09/28/2019  . HLD (hyperlipidemia) 09/28/2019  . Hypothyroidism, adult 09/28/2019  . Vitamin D deficiency disease 09/28/2019      Family History  Problem Relation Age of Onset  . Cancer Mother        lung  . Diabetes Mother   . Hypertension Mother   . Cancer Father        throat, tongue  . Diabetes Maternal Grandmother   . Hypertension Maternal Grandmother   . Cancer Paternal Grandfather     Social History   Social History Narrative   Married for 15 years.Works at [employer] shift.   Social History   Tobacco Use  . Smoking status: Current Every Day Smoker    Packs/day: 1.00    Years: 17.00    Pack years: 17.00    Types: Cigarettes  . Smokeless tobacco: Never Used  Substance Use Topics  . Alcohol use: No    Alcohol/week: 0.0 standard drinks     Current Meds  Medication Sig  . Cholecalciferol (VITAMIN D3) 1.25 MG (50000 UT) CAPS Take 2 capsules by mouth daily.   Marland Kitchen lisinopril (ZESTRIL) 5 MG tablet Take 1 tablet by mouth once daily  . NP THYROID 120 MG tablet Take 1 tablet (120 mg total) by mouth daily.    ROS:  Review  of Systems  Constitutional: Negative.   Respiratory: Negative.   Cardiovascular: Negative.   Gastrointestinal: Negative.   Neurological: Negative.   Psychiatric/Behavioral: Negative.      Objective:   Today's Vitals: BP (!) 142/84   Pulse 87   Temp (!) 97.3 F (36.3 C) (Temporal)   Ht 5' 0.3" (1.532 m)   Wt 172 lb (78 kg)   SpO2 97%   BMI 33.26 kg/m  Vitals with BMI 09/20/2020 05/15/2020 02/09/2020  Height 5' 0.3" _0  _1   Weight 172 lbs 173 lbs 6 oz 171 lbs 6 oz  BMI 33.24 53.29 92.4  Systolic 268 341 962  Diastolic 84 60 75  Pulse 87 81 96     Physical Exam Vitals reviewed. Exam conducted with a chaperone present.  Constitutional:      Appearance: Normal appearance.  HENT:     Head: Normocephalic and atraumatic.     Right Ear: Tympanic membrane, ear canal and external ear normal.     Left Ear: Tympanic membrane, ear canal and external ear normal.  Eyes:     General:        Right eye: No discharge.        Left eye: No discharge.  Extraocular Movements: Extraocular movements intact.     Conjunctiva/sclera: Conjunctivae normal.     Pupils: Pupils are equal, round, and reactive to light.  Neck:     Vascular: No carotid bruit.  Cardiovascular:     Rate and Rhythm: Normal rate and regular rhythm.     Pulses: Normal pulses.     Heart sounds: Normal heart sounds. No murmur heard.   Pulmonary:     Effort: Pulmonary effort is normal.     Breath sounds: Normal breath sounds.  Chest:     Breasts: Breasts are symmetrical.        Right: Normal.        Left: Normal.  Abdominal:     General: Abdomen is flat. Bowel sounds are normal. There is no distension.     Palpations: Abdomen is soft. There is no mass.     Tenderness: There is no abdominal tenderness.  Musculoskeletal:        General: No tenderness.     Cervical back: Neck supple. No muscular tenderness.     Right lower leg: No edema.     Left lower leg: No edema.  Lymphadenopathy:     Cervical: No  cervical adenopathy.     Upper Body:     Right upper body: No supraclavicular adenopathy.     Left upper body: No supraclavicular adenopathy.  Skin:    General: Skin is warm and dry.  Neurological:     General: No focal deficit present.     Mental Status: She is alert and oriented to person, place, and time.     Motor: No weakness.     Gait: Gait normal.  Psychiatric:        Mood and Affect: Mood normal.        Behavior: Behavior normal.        Judgment: Judgment normal.      PHQ9 SCORE ONLY 09/20/2020 02/09/2020 09/28/2019  PHQ-9 Total Score 0 0 0       Assessment and Plan   1. Encounter for general adult medical examination with abnormal findings   2. Cervical cancer screening   3. Hypothyroidism, adult   4. Essential hypertension, benign   5. Mixed hyperlipidemia   6. Screening for diabetes mellitus   7. Obesity (BMI 30-39.9)   8. Cigarette nicotine dependence without complication      Plan: 1.-8.  Will not administer flu shot today, she was encouraged to consider having the COVID-19 vaccine administered and if she chooses to do so to discuss this with her pharmacist.  Will refer to OB/GYN for Pap smear, patient does not want to do sexual transmitted infection screening so we will not order this today, we did discuss tobacco cessation and she will consider this further, depression screening came back negative.  She does not want to hepatitis C screening at this time.  Last mammogram was completed about 18 months ago she would like to wait until the 2-year mark to have mammogram completed for screenings.  She will continue on her medications prescribed for her chronic conditions.  I did discuss that lisinopril is contraindicated in pregnancy and if she were to have an unplanned pregnancy that she needs to stop the medication and notify us.  She tells me she understands.  We will collect blood work for further evaluation today as well.   Tests ordered Orders Placed This  Encounter  Procedures  . CBC with Differential/Platelets  . CMP with eGFR([facility])  . Lipid  Panel  . Hemoglobin A1c  . T3, Free  . T4, Free  . Ambulatory referral to Obstetrics / Gynecology      No orders of the defined types were placed in this encounter.   Patient to follow-up in 3 months or sooner as needed  Ailene Ards, NP

## 2020-09-21 LAB — HEMOGLOBIN A1C
Hgb A1c MFr Bld: 5.9 % of total Hgb — ABNORMAL HIGH (ref <5.7)
Mean Plasma Glucose: 123 (calc)
eAG (mmol/L): 6.8 (calc)

## 2020-09-21 LAB — CBC WITH DIFFERENTIAL/PLATELET
Absolute Monocytes: 662 cells/uL (ref 200–950)
Basophils Absolute: 95 cells/uL (ref 0–200)
Basophils Relative: 0.9 %
Eosinophils Absolute: 273 cells/uL (ref 15–500)
Eosinophils Relative: 2.6 %
HCT: 44 % (ref 35.0–45.0)
Hemoglobin: 15.4 g/dL (ref 11.7–15.5)
Lymphs Abs: 2279 cells/uL (ref 850–3900)
MCH: 32.6 pg (ref 27.0–33.0)
MCHC: 35 g/dL (ref 32.0–36.0)
MCV: 93 fL (ref 80.0–100.0)
MPV: 10.8 fL (ref 7.5–12.5)
Monocytes Relative: 6.3 %
Neutro Abs: 7193 cells/uL (ref 1500–7800)
Neutrophils Relative %: 68.5 %
Platelets: 347 10*3/uL (ref 140–400)
RBC: 4.73 10*6/uL (ref 3.80–5.10)
RDW: 12.2 % (ref 11.0–15.0)
Total Lymphocyte: 21.7 %
WBC: 10.5 10*3/uL (ref 3.8–10.8)

## 2020-09-21 LAB — LIPID PANEL
Cholesterol: 188 mg/dL (ref <200)
HDL: 29 mg/dL — ABNORMAL LOW (ref >=50)
LDL Cholesterol (Calc): 125 mg/dL (calc) — ABNORMAL HIGH
Non-HDL Cholesterol (Calc): 159 mg/dL (calc) — ABNORMAL HIGH (ref <130)
Total CHOL/HDL Ratio: 6.5 (calc) — ABNORMAL HIGH (ref <5.0)
Triglycerides: 215 mg/dL — ABNORMAL HIGH (ref <150)

## 2020-09-21 LAB — COMPLETE METABOLIC PANEL WITH GFR
AG Ratio: 1.7 (calc) (ref 1.0–2.5)
ALT: 15 U/L (ref 6–29)
AST: 10 U/L (ref 10–30)
Albumin: 4.3 g/dL (ref 3.6–5.1)
Alkaline phosphatase (APISO): 82 U/L (ref 31–125)
BUN: 9 mg/dL (ref 7–25)
CO2: 25 mmol/L (ref 20–32)
Calcium: 9.6 mg/dL (ref 8.6–10.2)
Chloride: 101 mmol/L (ref 98–110)
Creat: 0.71 mg/dL (ref 0.50–1.10)
GFR, Est African American: 121 mL/min/{1.73_m2} (ref >=60)
GFR, Est Non African American: 104 mL/min/{1.73_m2} (ref >=60)
Globulin: 2.6 g/dL (calc) (ref 1.9–3.7)
Glucose, Bld: 119 mg/dL — ABNORMAL HIGH (ref 65–99)
Potassium: 4.5 mmol/L (ref 3.5–5.3)
Sodium: 137 mmol/L (ref 135–146)
Total Bilirubin: 0.4 mg/dL (ref 0.2–1.2)
Total Protein: 6.9 g/dL (ref 6.1–8.1)

## 2020-09-21 LAB — T4, FREE: Free T4: 1.2 ng/dL (ref 0.8–1.8)

## 2020-09-21 LAB — T3, FREE: T3, Free: 6.5 pg/mL — ABNORMAL HIGH (ref 2.3–4.2)

## 2020-11-02 ENCOUNTER — Other Ambulatory Visit (INDEPENDENT_AMBULATORY_CARE_PROVIDER_SITE_OTHER): Payer: Self-pay | Admitting: Internal Medicine

## 2020-11-02 DIAGNOSIS — I10 Essential (primary) hypertension: Secondary | ICD-10-CM

## 2020-12-01 ENCOUNTER — Other Ambulatory Visit: Payer: Self-pay

## 2020-12-01 ENCOUNTER — Other Ambulatory Visit (HOSPITAL_COMMUNITY)
Admission: RE | Admit: 2020-12-01 | Discharge: 2020-12-01 | Disposition: A | Discharge status: Home / Self Care | Payer: BC Managed Care – PPO | Source: Ambulatory Visit | Attending: Adult Health | Admitting: Adult Health

## 2020-12-01 ENCOUNTER — Ambulatory Visit (INDEPENDENT_AMBULATORY_CARE_PROVIDER_SITE_OTHER): Payer: BC Managed Care – PPO | Admitting: Adult Health

## 2020-12-01 ENCOUNTER — Encounter: Payer: Self-pay | Admitting: Adult Health

## 2020-12-01 VITALS — BP 147/82 | HR 79 | Ht 60.5 in | Wt 172.0 lb

## 2020-12-01 DIAGNOSIS — Z01419 Encounter for gynecological examination (general) (routine) without abnormal findings: Secondary | ICD-10-CM | POA: Insufficient documentation

## 2020-12-01 DIAGNOSIS — F17200 Nicotine dependence, unspecified, uncomplicated: Secondary | ICD-10-CM | POA: Diagnosis not present

## 2020-12-01 DIAGNOSIS — Z720 Tobacco use: Secondary | ICD-10-CM | POA: Insufficient documentation

## 2020-12-01 DIAGNOSIS — Z1211 Encounter for screening for malignant neoplasm of colon: Secondary | ICD-10-CM | POA: Diagnosis not present

## 2020-12-01 LAB — HEMOCCULT GUIAC POC 1CARD (OFFICE): Fecal Occult Blood, POC: NEGATIVE

## 2020-12-01 NOTE — Progress Notes (Signed)
Patient ID: Melissa Wilkerson, female   DOB: Dec 30, 1975, 45 y.o.   MRN: [id number] History of Present Illness:  Melissa Wilkerson is a 45 year old white female,married, [id number], in for pap and pelvic, she had physical with PCP. Last pap 05/26/15 negative malignancy and negative  PCP is Dr Karilyn Cota.  Current Medications, Allergies, Past Medical History, Past Surgical History, Family History and Social History were reviewed in [employer] record.     Review of Systems: Patient denies any daily headaches, hearing loss, fatigue, blurred vision, shortness of breath, chest pain, abdominal pain, problems with bowel movements, urination, or intercourse. No joint pain or mood swings. Has had 2 months in a month on the past.   Physical Exam:BP (!) 147/82 (BP Location: Right Arm, Patient Position: Sitting, Cuff Size: Normal)   Pulse 79   Ht 5' 0.5" (1.537 m)   Wt 172 lb (78 kg)   LMP 11/23/2020   BMI 33.04 kg/m  General:  Well developed, well nourished, no acute distress Skin:  Warm and dry Pelvic:  External genitalia is normal in appearance,has multiple epidermal cysts on both labia R>L.  The vagina is normal in appearance. Urethra has no lesions or masses. The cervix is bulbous. Pap with HRHPV genotyping performed.  Uterus is felt to be normal size, shape, and contour.  No adnexal masses or tenderness noted.Bladder is non tender, no masses felt. Rectal: Good sphincter tone, no polyps, or hemorrhoids felt.  Hemoccult negative. Extremities/musculoskeletal:  No swelling or varicosities noted, no clubbing or cyanosis Psych:  No mood changes, alert and cooperative,seems happy AA is 1  Fall risk is low PHQ 9 score is 5 GAD 7 score is 0  Upstream - 12/01/20 1103      Pregnancy Intention Screening   Does the patient want to become pregnant in the next year? No    Does the patient's partner want to become pregnant in the next year? No    Would the patient like to discuss contraceptive options  today? No      Contraception Wrap Up   Current Method Withdrawal or Other Method    End Method Withdrawal or Other Method    Contraception Counseling Provided No         Examination chaperoned by Malachy Mood LPN  Impression and Plan: 1. Encounter for gynecological examination with Papanicolaou smear of cervix Pap sent Physical with PCP Pap in 3 years if normal Get mammogram now Labs with PCP Colonoscopy at 46  2. Encounter for screening fecal occult blood testing   3. Smoker She declines quitting for now

## 2020-12-06 LAB — CYTOLOGY - PAP
Comment: NEGATIVE
Diagnosis: NEGATIVE
High risk HPV: NEGATIVE

## 2020-12-21 ENCOUNTER — Ambulatory Visit (INDEPENDENT_AMBULATORY_CARE_PROVIDER_SITE_OTHER): Payer: BC Managed Care – PPO | Admitting: Internal Medicine

## 2020-12-25 ENCOUNTER — Ambulatory Visit (INDEPENDENT_AMBULATORY_CARE_PROVIDER_SITE_OTHER): Payer: BC Managed Care – PPO | Admitting: Internal Medicine

## 2021-01-04 ENCOUNTER — Other Ambulatory Visit: Payer: Self-pay

## 2021-01-04 ENCOUNTER — Encounter (INDEPENDENT_AMBULATORY_CARE_PROVIDER_SITE_OTHER): Payer: Self-pay | Admitting: Internal Medicine

## 2021-01-04 ENCOUNTER — Ambulatory Visit (INDEPENDENT_AMBULATORY_CARE_PROVIDER_SITE_OTHER): Payer: BC Managed Care – PPO | Admitting: Internal Medicine

## 2021-01-04 VITALS — BP 150/90 | HR 72 | Ht 61.0 in | Wt 175.0 lb

## 2021-01-04 DIAGNOSIS — R7303 Prediabetes: Secondary | ICD-10-CM | POA: Diagnosis not present

## 2021-01-04 DIAGNOSIS — I10 Essential (primary) hypertension: Secondary | ICD-10-CM

## 2021-01-04 DIAGNOSIS — E039 Hypothyroidism, unspecified: Secondary | ICD-10-CM | POA: Diagnosis not present

## 2021-01-04 DIAGNOSIS — E782 Mixed hyperlipidemia: Secondary | ICD-10-CM | POA: Diagnosis not present

## 2021-01-04 MED ORDER — LISINOPRIL 10 MG PO TABS: 10.0000 mg | ORAL_TABLET | Freq: Every day | ORAL | 1 refills | Status: DC

## 2021-01-04 NOTE — Patient Instructions (Signed)
[facility] Dietary Recommendations for Weight Loss What to Avoid . Avoid added sugars o Often added sugar can be found in processed foods such as many condiments, dry cereals, cakes, cookies, chips, crisps, crackers, candies, sweetened drinks, etc.  o Read labels and AVOID/DECREASE use of foods with the following in their ingredient list: Sugar, fructose, high fructose corn syrup, sucrose, glucose, maltose, dextrose, molasses, cane sugar, brown sugar, any type of syrup, agave nectar, etc.   . Avoid snacking in between meals . Avoid foods made with flour o If you are going to eat food made with flour, choose those made with whole-grains; and, minimize your consumption as much as is tolerable . Avoid processed foods o These foods are generally stocked in the middle of the grocery store. Focus on shopping on the perimeter of the grocery.  . Avoid Meat  o We recommend following a plant-based diet at [facility]. Thus, we recommend avoiding meat as a general rule. Consider eating beans, legumes, eggs, and/or dairy products for regular protein sources o If you plan on eating meat limit to 4 ounces of meat at a time and choose lean options such as Fish, chicken, turkey. Avoid red meat intake such as pork and/or steak What to Include . Vegetables o GREEN LEAFY VEGETABLES: Kale, spinach, mustard greens, collard greens, cabbage, broccoli, etc. o OTHER: Asparagus, cauliflower, eggplant, carrots, peas, Brussel sprouts, tomatoes, bell peppers, zucchini, beets, cucumbers, etc. . Grains, seeds, and legumes o Beans: kidney beans, black eyed peas, garbanzo beans, black beans, pinto beans, etc. o Whole, unrefined grains: brown rice, barley, bulgur, oatmeal, etc. . Healthy fats  o Avoid highly processed fats such as vegetable oil o Examples of healthy fats: avocado, olives, virgin olive oil, dark chocolate (?72% Cocoa), nuts (peanuts, almonds, walnuts, cashews, pecans, etc.) . None to Low  Intake of Animal Sources of Protein o Meat sources: chicken, turkey, salmon, tuna. Limit to 4 ounces of meat at one time. o Consider limiting dairy sources, but when choosing dairy focus on: PLAIN Greek yogurt, cottage cheese, high-protein milk . Fruit o Choose berries  When to Eat . Intermittent Fasting: o Choosing not to eat for a specific time period, but DO FOCUS ON HYDRATION when fasting o Multiple Techniques: - Time Restricted Eating: eat 3 meals in a day, each meal lasting no more than 60 minutes, no snacks between meals - 16-18 hour fast: fast for 16 to 18 hours up to 7 days a week. Often suggested to start with 2-3 nonconsecutive days per week.  . Remember the time you sleep is counted as fasting.  . Examples of eating schedule: Fast from 7:00pm-11:00am. Eat between 11:00am-7:00pm.  - 24-hour fast: fast for 24 hours up to every other day. Often suggested to start with 1 day per week . Remember the time you sleep is counted as fasting . Examples of eating schedule:  o Eating day: eat 2-3 meals on your eating day. If doing 2 meals, each meal should last no more than 90 minutes. If doing 3 meals, each meal should last no more than 60 minutes. Finish last meal by 7:00pm. o Fasting day: Fast until 7:00pm.  o IF YOU FEEL UNWELL FOR ANY REASON/IN ANY WAY WHEN FASTING, STOP FASTING BY EATING A NUTRITIOUS SNACK OR LIGHT MEAL o ALWAYS FOCUS ON HYDRATION DURING FASTS - Acceptable Hydration sources: water, broths, tea/coffee (black tea/coffee is best but using a small amount of whole-fat dairy products in coffee/tea is acceptable).  -   Poor Hydration Sources: anything with sugar or artificial sweeteners added to it  These recommendations have been developed for patients that are actively receiving medical care from either Dr. Deysy Schabel or Sarah Gray, DNP, NP-C at [facility]. These recommendations are developed for patients with specific medical conditions and are not meant to be  distributed or used by others that are not actively receiving care from either provider listed above at [facility]. It is not appropriate to participate in the above eating plans without proper medical supervision.   Reference: Fung, J. The obesity code. [place]: [place]; 2016.   

## 2021-01-04 NOTE — Progress Notes (Signed)
Metrics: Intervention Frequency ACO  Documented Smoking Status Yearly  Screened one or more times in 24 months  Cessation Counseling or  Active cessation medication Past 24 months  Past 24 months   Guideline developer: UpToDate (See UpToDate for funding source) Date Released: 2014       Wellness Office Visit  Subjective:  Patient ID: Melissa Wilkerson, female    DOB: February 14, 1976  Age: 45 y.o. MRN: [id number]  CC: This lady comes in for follow-up of hypertension, hypothyroidism, dyslipidemia and newly diagnosed prediabetes from last blood work that she had done with Maralyn Sago during her annual physical exam. HPI  She continues to smoke cigarettes. She continues with lisinopril low-dose. Her dyslipidemia is unfavorable with low HDL levels and high triglycerides. She continues on desiccated NP thyroid for her hypothyroidism and free T3 levels were in a good range. She admits that her diet is not healthy. Past Medical History:  Diagnosis Date  . Abdominal bloating 05/26/2015  . Breast nodule 03/31/2015  . Dyspareunia 05/26/2015  . Essential hypertension, benign 09/28/2019  . HLD (hyperlipidemia) 09/28/2019  . Hypothyroidism, adult 09/28/2019  . Vitamin D deficiency disease 09/28/2019   History reviewed. No pertinent surgical history.   Family History  Problem Relation Age of Onset  . Cancer Mother        lung  . Diabetes Mother   . Hypertension Mother   . Cancer Father        throat, tongue  . Diabetes Maternal Grandmother   . Hypertension Maternal Grandmother   . Cancer Paternal Grandfather     Social History   Social History Narrative   Married for 15 years.Works at [employer] shift.   Social History   Tobacco Use  . Smoking status: Current Every Day Smoker    Packs/day: 1.00    Years: 17.00    Pack years: 17.00    Types: Cigarettes  . Smokeless tobacco: Never Used  Substance Use Topics  . Alcohol use: No    Alcohol/week: 0.0 standard drinks    Current  Meds  Medication Sig  . Cholecalciferol (VITAMIN D3) 1.25 MG (50000 UT) CAPS Take 2 capsules by mouth daily.   Marland Kitchen lisinopril (ZESTRIL) 10 MG tablet Take 1 tablet (10 mg total) by mouth daily.  . NP THYROID 120 MG tablet Take 1 tablet by mouth once daily  . [DISCONTINUED] lisinopril (ZESTRIL) 5 MG tablet Take 1 tablet by mouth once daily     Flowsheet Row Office Visit from 12/01/2020 in [facility]  PHQ-9 Total Score 5      Objective:   Today's Vitals: BP (!) 150/90   Pulse 72   Ht 5\' 1"  (1.549 m)   Wt 175 lb (79.4 kg)   BMI 33.07 kg/m  Vitals with BMI 01/04/2021 12/01/2020 12/01/2020  Height 5\' 1"  - 5' 0.5"  Weight 175 lbs - 172 lbs  BMI 33.08 - 33.03  Systolic 150 147 12/03/2020  Diastolic 90 82 78  Pulse 72 79 78     Physical Exam   She remains obese and her blood pressure is elevated.  Previous readings have shown  higher blood pressure readings also.  She is alert and orientated without any focal neurological signs.    Assessment   1. Hypothyroidism, adult   2. Essential hypertension, benign   3. Mixed hyperlipidemia   4. Prediabetes       Tests ordered No orders of the defined types were placed in this encounter.  Plan: 1. I am concerned about her uncontrolled hypertension and I have asked her to increase her lisinopril dose to 10 mg daily and I have sent a new prescription to reflect this change. 2. She will continue with same dose of NP thyroid 120 mg daily. 3. We discussed her prediabetes and dyslipidemia and we focused on nutrition.  I discussed the concept of intermittent fasting combined with a plant-based diet. 4. Follow-up with me in 1 month for close monitoring to see how she is doing and we will do blood work then.   Meds ordered this encounter  Medications  . lisinopril (ZESTRIL) 10 MG tablet    Sig: Take 1 tablet (10 mg total) by mouth daily.    Dispense:  90 tablet    Refill:  1    Melissa Floren Normajean Glasgow, MD

## 2021-02-07 ENCOUNTER — Ambulatory Visit (INDEPENDENT_AMBULATORY_CARE_PROVIDER_SITE_OTHER): Payer: BC Managed Care – PPO | Admitting: Internal Medicine

## 2021-03-13 ENCOUNTER — Ambulatory Visit (INDEPENDENT_AMBULATORY_CARE_PROVIDER_SITE_OTHER): Payer: BC Managed Care – PPO | Admitting: Internal Medicine

## 2021-03-13 ENCOUNTER — Encounter (INDEPENDENT_AMBULATORY_CARE_PROVIDER_SITE_OTHER): Payer: Self-pay | Admitting: Internal Medicine

## 2021-03-13 ENCOUNTER — Other Ambulatory Visit: Payer: Self-pay

## 2021-03-13 VITALS — BP 138/89 | HR 91 | Temp 97.7°F | Resp 18 | Ht 61.0 in | Wt 176.0 lb

## 2021-03-13 DIAGNOSIS — E782 Mixed hyperlipidemia: Secondary | ICD-10-CM

## 2021-03-13 DIAGNOSIS — R7303 Prediabetes: Secondary | ICD-10-CM | POA: Diagnosis not present

## 2021-03-13 DIAGNOSIS — Z1231 Encounter for screening mammogram for malignant neoplasm of breast: Secondary | ICD-10-CM

## 2021-03-13 DIAGNOSIS — E039 Hypothyroidism, unspecified: Secondary | ICD-10-CM | POA: Diagnosis not present

## 2021-03-13 DIAGNOSIS — I10 Essential (primary) hypertension: Secondary | ICD-10-CM

## 2021-03-13 NOTE — Progress Notes (Signed)
Metrics: Intervention Frequency ACO  Documented Smoking Status Yearly  Screened one or more times in 24 months  Cessation Counseling or  Active cessation medication Past 24 months  Past 24 months   Guideline developer: UpToDate (See UpToDate for funding source) Date Released: 2014       Wellness Office Visit  Subjective:  Patient ID: Melissa Wilkerson, female    DOB: 1976/11/08  Age: 45 y.o. MRN: [id number]  CC: This lady comes in for follow-up of uncontrolled hypertension, hypothyroidism, prediabetes and dyslipidemia. HPI  When I had seen her in the last visit, her hypertension was uncontrolled and I increased her dose of lisinopril.  She has been compliant with this and tolerated this increased dose. She has not unfortunately changed her diet to help with her prediabetes.  She continues to smoke cigarettes. She continues to take NP thyroid for her hypothyroidism. Past Medical History:  Diagnosis Date  . Abdominal bloating 05/26/2015  . Breast nodule 03/31/2015  . Dyspareunia 05/26/2015  . Essential hypertension, benign 09/28/2019  . HLD (hyperlipidemia) 09/28/2019  . Hypothyroidism, adult 09/28/2019  . Vitamin D deficiency disease 09/28/2019   History reviewed. No pertinent surgical history.   Family History  Problem Relation Age of Onset  . Cancer Mother        lung  . Diabetes Mother   . Hypertension Mother   . Cancer Father        throat, tongue  . Diabetes Maternal Grandmother   . Hypertension Maternal Grandmother   . Cancer Paternal Grandfather     Social History   Social History Narrative   Married for 15 years.Works at [employer] shift.   Social History   Tobacco Use  . Smoking status: Current Every Day Smoker    Packs/day: 1.00    Years: 17.00    Pack years: 17.00    Types: Cigarettes  . Smokeless tobacco: Never Used  Substance Use Topics  . Alcohol use: No    Alcohol/week: 0.0 standard drinks    Current Meds  Medication Sig  .  Cholecalciferol (VITAMIN D3) 1.25 MG (50000 UT) CAPS Take 2 capsules by mouth daily.   Marland Kitchen lisinopril (ZESTRIL) 10 MG tablet Take 1 tablet (10 mg total) by mouth daily.  . NP THYROID 120 MG tablet Take 1 tablet by mouth once daily     Flowsheet Row Office Visit from 12/01/2020 in [facility]  PHQ-9 Total Score 5      Objective:   Today's Vitals: BP 138/89 (BP Location: Left Arm, Patient Position: Sitting, Cuff Size: Normal)   Pulse 91   Temp 97.7 F (36.5 C) (Temporal)   Resp 18   Ht 5\' 1"  (1.549 m)   Wt 176 lb (79.8 kg)   SpO2 99%   BMI 33.25 kg/m  Vitals with BMI 03/13/2021 01/04/2021 12/01/2020  Height 5\' 1"  5\' 1"  -  Weight 176 lbs 175 lbs -  BMI 33.27 33.08 -  Systolic 138 150 12/03/2020  Diastolic 89 90 82  Pulse 91 72 79     Physical Exam  She looks systemically well.  Weight is unchanged.  Blood pressure has improved although still not controlled.     Assessment   1. Essential hypertension, benign   2. Hypothyroidism, adult   3. Prediabetes   4. Mixed hyperlipidemia   5. Encounter for screening mammogram for malignant neoplasm of breast       Tests ordered Orders Placed This Encounter  Procedures  . MM 3D  SCREEN BREAST BILATERAL  . COMPLETE METABOLIC PANEL WITH GFR  . Hemoglobin A1c  . Lipid panel     Plan: 1. Continue on the current dose of lisinopril and we may need to adjust further although I am encouraging her to eat better and exercise if possible. 2. Continue with NP thyroid at the previous dose.  Previous free T3 levels were in a good range. 3. Screening mammogram. 4. I will see her in about 3 months time for follow-up.   No orders of the defined types were placed in this encounter.   Wilson Singer, MD

## 2021-03-14 LAB — COMPLETE METABOLIC PANEL WITH GFR
AG Ratio: 1.5 (calc) (ref 1.0–2.5)
ALT: 17 U/L (ref 6–29)
AST: 13 U/L (ref 10–30)
Albumin: 4.2 g/dL (ref 3.6–5.1)
Alkaline phosphatase (APISO): 81 U/L (ref 31–125)
BUN: 13 mg/dL (ref 7–25)
CO2: 24 mmol/L (ref 20–32)
Calcium: 9.3 mg/dL (ref 8.6–10.2)
Chloride: 102 mmol/L (ref 98–110)
Creat: 0.69 mg/dL (ref 0.50–1.10)
GFR, Est African American: 124 mL/min/{1.73_m2} (ref >=60)
GFR, Est Non African American: 107 mL/min/{1.73_m2} (ref >=60)
Globulin: 2.8 g/dL (calc) (ref 1.9–3.7)
Glucose, Bld: 145 mg/dL — ABNORMAL HIGH (ref 65–99)
Potassium: 4.5 mmol/L (ref 3.5–5.3)
Sodium: 135 mmol/L (ref 135–146)
Total Bilirubin: 0.5 mg/dL (ref 0.2–1.2)
Total Protein: 7 g/dL (ref 6.1–8.1)

## 2021-03-14 LAB — HEMOGLOBIN A1C
Hgb A1c MFr Bld: 6.2 % of total Hgb — ABNORMAL HIGH (ref <5.7)
Mean Plasma Glucose: 131 mg/dL
eAG (mmol/L): 7.3 mmol/L

## 2021-03-14 LAB — LIPID PANEL
Cholesterol: 202 mg/dL — ABNORMAL HIGH (ref <200)
HDL: 30 mg/dL — ABNORMAL LOW (ref >=50)
LDL Cholesterol (Calc): 134 mg/dL (calc) — ABNORMAL HIGH
Non-HDL Cholesterol (Calc): 172 mg/dL (calc) — ABNORMAL HIGH (ref <130)
Total CHOL/HDL Ratio: 6.7 (calc) — ABNORMAL HIGH (ref <5.0)
Triglycerides: 235 mg/dL — ABNORMAL HIGH (ref <150)

## 2021-03-19 ENCOUNTER — Ambulatory Visit (HOSPITAL_COMMUNITY)
Admission: RE | Admit: 2021-03-19 | Discharge: 2021-03-19 | Disposition: A | Discharge status: Home / Self Care | Payer: BC Managed Care – PPO | Source: Ambulatory Visit | Attending: Internal Medicine | Admitting: Internal Medicine

## 2021-03-19 ENCOUNTER — Other Ambulatory Visit: Payer: Self-pay

## 2021-03-19 DIAGNOSIS — Z1231 Encounter for screening mammogram for malignant neoplasm of breast: Secondary | ICD-10-CM | POA: Diagnosis not present

## 2021-03-22 ENCOUNTER — Other Ambulatory Visit (INDEPENDENT_AMBULATORY_CARE_PROVIDER_SITE_OTHER): Payer: Self-pay | Admitting: Internal Medicine

## 2021-07-02 ENCOUNTER — Other Ambulatory Visit (INDEPENDENT_AMBULATORY_CARE_PROVIDER_SITE_OTHER): Payer: Self-pay

## 2021-07-02 DIAGNOSIS — I10 Essential (primary) hypertension: Secondary | ICD-10-CM

## 2021-07-02 DIAGNOSIS — E039 Hypothyroidism, unspecified: Secondary | ICD-10-CM

## 2021-07-02 NOTE — Telephone Encounter (Signed)
Dr Karilyn Cota plan of LOV:03/13/21 Plan: Continue on the current dose of lisinopril and we may need to adjust further although I am encouraging her to eat better and exercise if possible. Continue with NP thyroid at the previous dose.  Previous free T3 levels were in a good range. Screening mammogram. I will see her in about 3 months time for follow-up.

## 2021-07-03 ENCOUNTER — Ambulatory Visit (INDEPENDENT_AMBULATORY_CARE_PROVIDER_SITE_OTHER): Payer: BC Managed Care – PPO | Admitting: Internal Medicine

## 2021-07-04 MED ORDER — THYROID 90 MG PO TABS: 90.0000 mg | ORAL_TABLET | Freq: Every day | ORAL | 0 refills | Status: DC

## 2021-07-04 MED ORDER — LISINOPRIL 10 MG PO TABS
10.0000 mg | ORAL_TABLET | Freq: Every day | ORAL | 0 refills | Status: DC
Start: 2021-07-04 — End: 2023-10-01

## 2021-07-04 NOTE — Telephone Encounter (Signed)
After reviewing her chart and her last labs I think we need to reduce her dose of NP thyroid slightly.  I have reduced her dose from 120 mg/day down to 90 mg by mouth daily.  Please make sure she knows she needs to find new primary care provider and she should be seen as soon as possible so she can have blood work repeated in 6-8 weeks to monitor her thyroid levels.  I am going to order referral to endocrinology just in case as she may be able to see them sooner than a new PCP.  If she does not want to see an endocrinologist you can cancel this referral but make sure she is offered this referral. Please make sure to call this patient to notify her of the above. Thanks.

## 2021-09-06 IMAGING — MG MM DIGITAL SCREENING BILAT W/ TOMO AND CAD
8 series · 9 of 24 positions shown · non-contrast
Comparison: Previous exam(s).

CLINICAL DATA: Screening.

EXAM:
DIGITAL SCREENING BILATERAL MAMMOGRAM WITH TOMOSYNTHESIS AND CAD
TECHNIQUE: Bilateral screening digital craniocaudal and mediolateral oblique
mammograms were obtained. Bilateral screening digital breast
tomosynthesis was performed. The images were evaluated with
computer-aided detection.

[L MLO synth-2D]
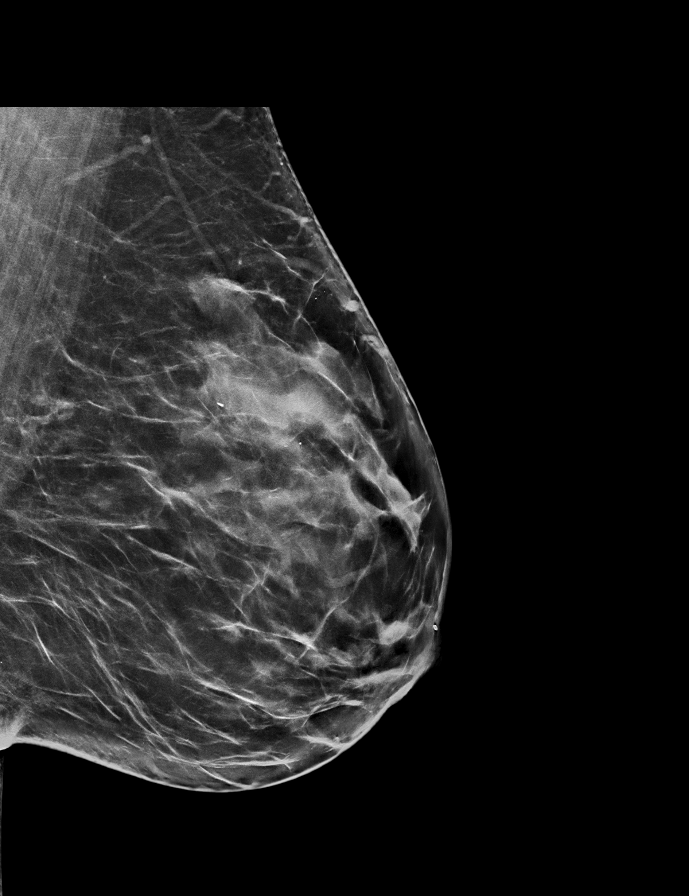

[R CC synth-2D]
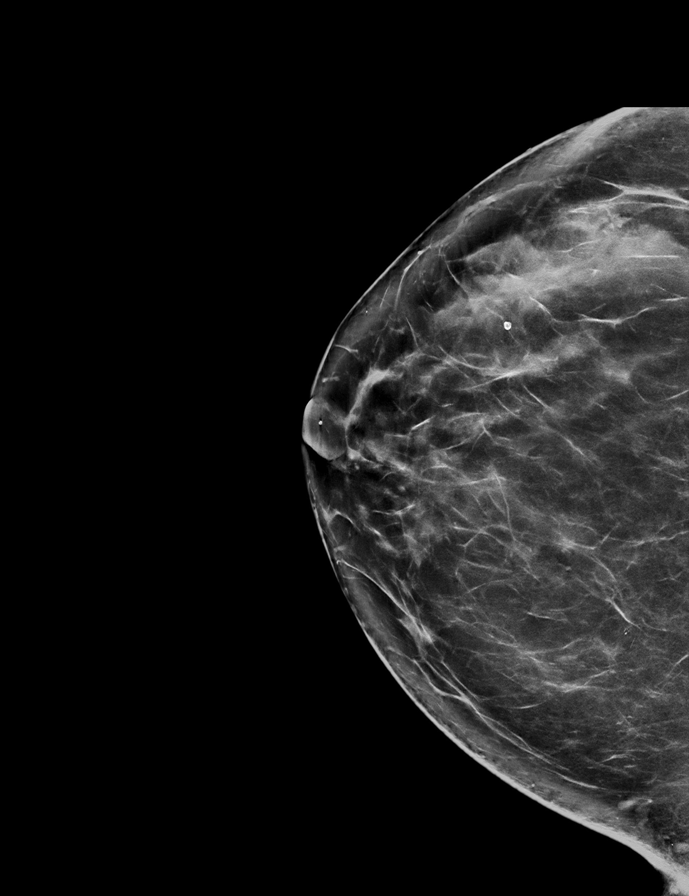

[L CC synth-2D]
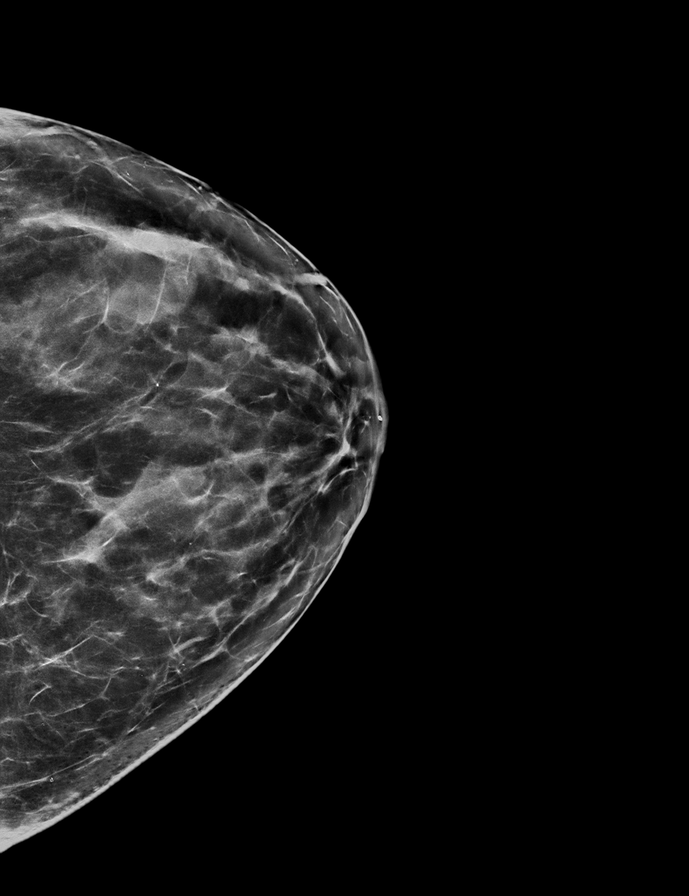

[R MLO synth-2D]
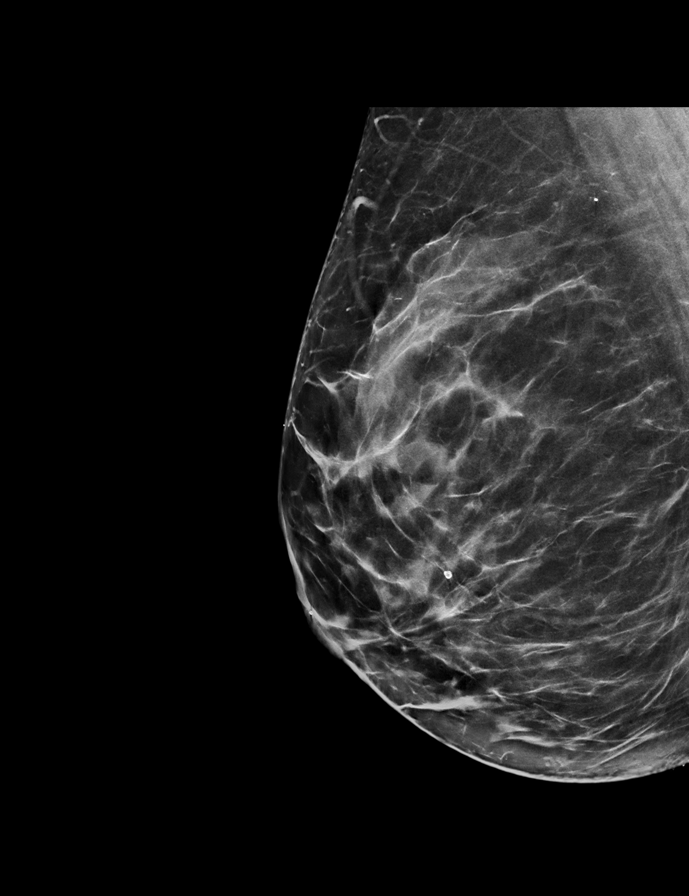

[L MLO tomo · 2 of 78 frames shown]
[frame 26/78]
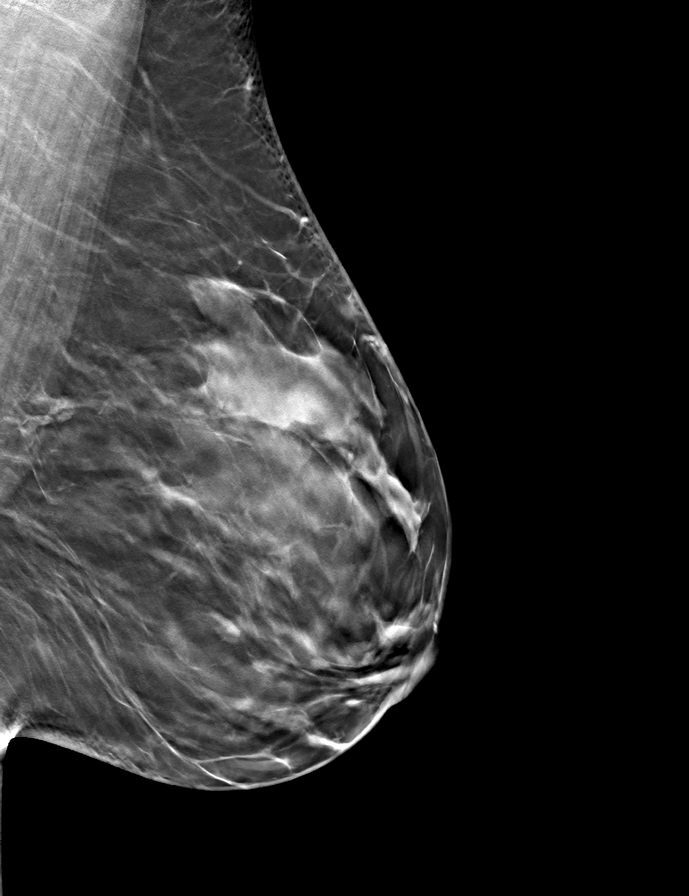
[frame 39/78]
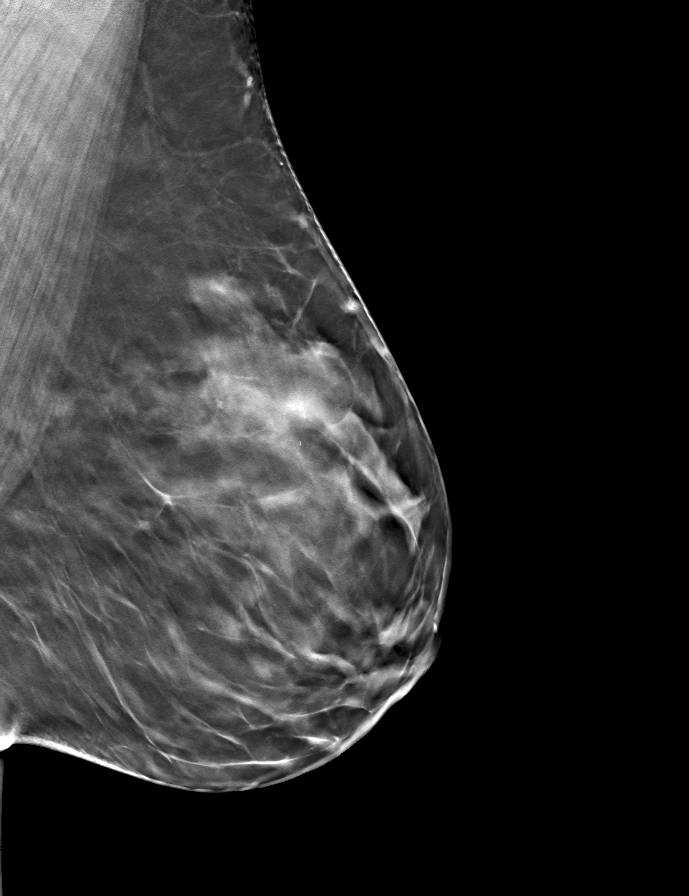

[R MLO tomo · tomo slice 40/79.0]
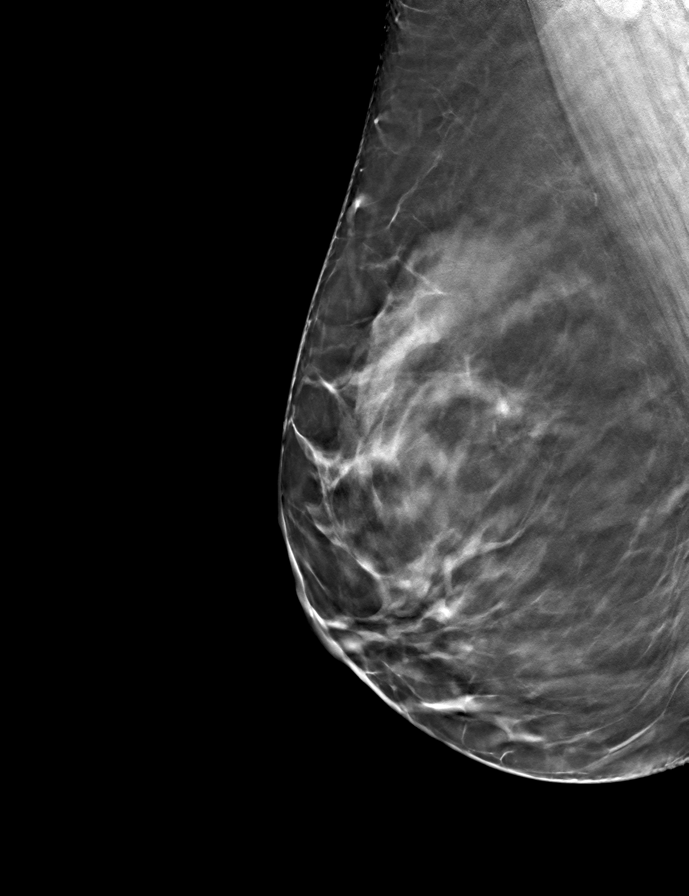

[L CC tomo · tomo slice 37/73.0]
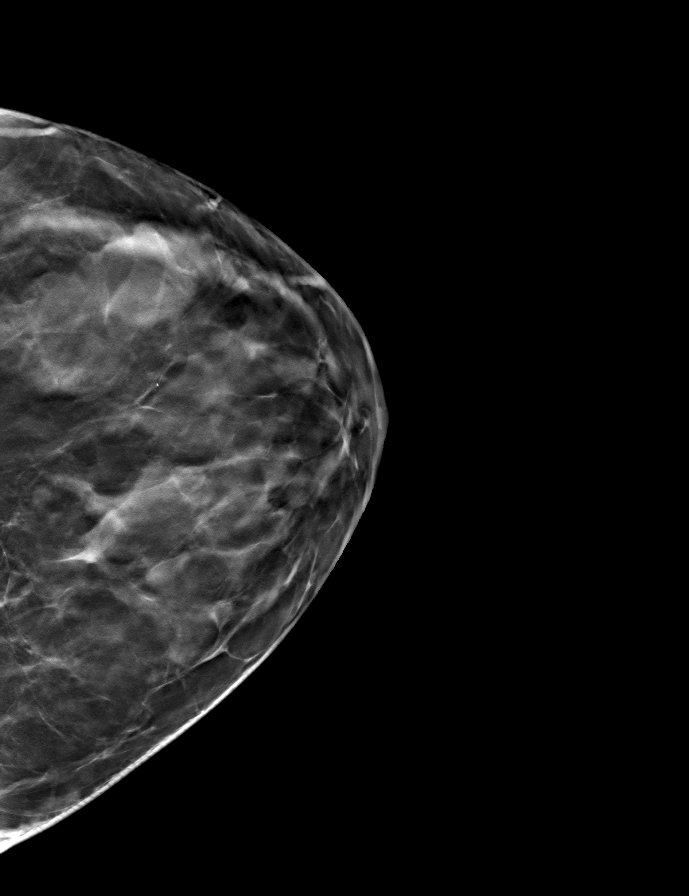

[R CC tomo · tomo slice 43/85.0]
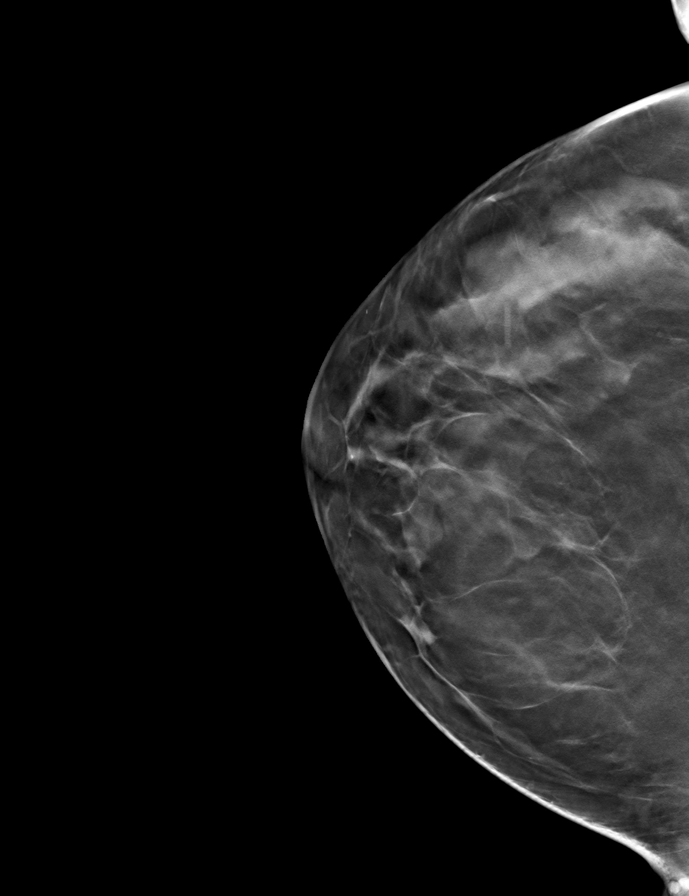

[9 of 24 positions shown; findings below may reference images not displayed]

ACR Breast Density Category c: The breast tissue is heterogeneously
dense, which may obscure small masses.
FINDINGS: There are no findings suspicious for malignancy. The images were
evaluated with computer-aided detection.
IMPRESSION: No mammographic evidence of malignancy. A result letter of this
screening mammogram will be mailed directly to the patient.

RECOMMENDATION:
Screening mammogram in one year. (Code:T4-5-GWO)

BI-RADS CATEGORY  1: Negative.

## 2023-10-01 ENCOUNTER — Encounter (HOSPITAL_COMMUNITY): Payer: Self-pay

## 2023-10-01 ENCOUNTER — Emergency Department (HOSPITAL_COMMUNITY): Payer: BC Managed Care – PPO

## 2023-10-01 ENCOUNTER — Other Ambulatory Visit: Payer: Self-pay

## 2023-10-01 ENCOUNTER — Inpatient Hospital Stay (HOSPITAL_COMMUNITY)
Admission: EM | Admit: 2023-10-01 | Discharge: 2023-10-06 | DRG: 065 | Disposition: A | Discharge status: Rehabilitation Facility | Payer: BC Managed Care – PPO | Attending: Internal Medicine | Admitting: Internal Medicine

## 2023-10-01 DIAGNOSIS — E278 Other specified disorders of adrenal gland: Secondary | ICD-10-CM | POA: Diagnosis present

## 2023-10-01 DIAGNOSIS — F17210 Nicotine dependence, cigarettes, uncomplicated: Secondary | ICD-10-CM | POA: Diagnosis not present

## 2023-10-01 DIAGNOSIS — Z833 Family history of diabetes mellitus: Secondary | ICD-10-CM

## 2023-10-01 DIAGNOSIS — E119 Type 2 diabetes mellitus without complications: Secondary | ICD-10-CM | POA: Diagnosis not present

## 2023-10-01 DIAGNOSIS — I10 Essential (primary) hypertension: Secondary | ICD-10-CM | POA: Diagnosis not present

## 2023-10-01 DIAGNOSIS — Z7902 Long term (current) use of antithrombotics/antiplatelets: Secondary | ICD-10-CM

## 2023-10-01 DIAGNOSIS — E039 Hypothyroidism, unspecified: Secondary | ICD-10-CM | POA: Diagnosis present

## 2023-10-01 DIAGNOSIS — Z79899 Other long term (current) drug therapy: Secondary | ICD-10-CM | POA: Diagnosis not present

## 2023-10-01 DIAGNOSIS — I639 Cerebral infarction, unspecified: Principal | ICD-10-CM | POA: Diagnosis present

## 2023-10-01 DIAGNOSIS — G936 Cerebral edema: Secondary | ICD-10-CM | POA: Diagnosis not present

## 2023-10-01 DIAGNOSIS — N83202 Unspecified ovarian cyst, left side: Secondary | ICD-10-CM | POA: Diagnosis not present

## 2023-10-01 DIAGNOSIS — Z7982 Long term (current) use of aspirin: Secondary | ICD-10-CM | POA: Diagnosis not present

## 2023-10-01 DIAGNOSIS — R918 Other nonspecific abnormal finding of lung field: Secondary | ICD-10-CM | POA: Diagnosis present

## 2023-10-01 DIAGNOSIS — I6389 Other cerebral infarction: Principal | ICD-10-CM | POA: Diagnosis present

## 2023-10-01 DIAGNOSIS — G8310 Monoplegia of lower limb affecting unspecified side: Secondary | ICD-10-CM | POA: Diagnosis not present

## 2023-10-01 DIAGNOSIS — R7303 Prediabetes: Secondary | ICD-10-CM | POA: Diagnosis present

## 2023-10-01 DIAGNOSIS — Z1152 Encounter for screening for COVID-19: Secondary | ICD-10-CM | POA: Diagnosis not present

## 2023-10-01 DIAGNOSIS — D6862 Lupus anticoagulant syndrome: Secondary | ICD-10-CM | POA: Diagnosis not present

## 2023-10-01 DIAGNOSIS — R269 Unspecified abnormalities of gait and mobility: Secondary | ICD-10-CM | POA: Diagnosis present

## 2023-10-01 DIAGNOSIS — J439 Emphysema, unspecified: Secondary | ICD-10-CM | POA: Diagnosis not present

## 2023-10-01 DIAGNOSIS — R29818 Other symptoms and signs involving the nervous system: Secondary | ICD-10-CM | POA: Diagnosis not present

## 2023-10-01 DIAGNOSIS — N390 Urinary tract infection, site not specified: Secondary | ICD-10-CM | POA: Diagnosis not present

## 2023-10-01 DIAGNOSIS — R2990 Unspecified symptoms and signs involving the nervous system: Principal | ICD-10-CM

## 2023-10-01 DIAGNOSIS — B9620 Unspecified Escherichia coli [E. coli] as the cause of diseases classified elsewhere: Secondary | ICD-10-CM | POA: Diagnosis not present

## 2023-10-01 DIAGNOSIS — E782 Mixed hyperlipidemia: Secondary | ICD-10-CM | POA: Diagnosis present

## 2023-10-01 DIAGNOSIS — I6523 Occlusion and stenosis of bilateral carotid arteries: Secondary | ICD-10-CM | POA: Diagnosis not present

## 2023-10-01 DIAGNOSIS — Z8249 Family history of ischemic heart disease and other diseases of the circulatory system: Secondary | ICD-10-CM | POA: Diagnosis not present

## 2023-10-01 DIAGNOSIS — R29702 NIHSS score 2: Secondary | ICD-10-CM | POA: Diagnosis not present

## 2023-10-01 DIAGNOSIS — E1165 Type 2 diabetes mellitus with hyperglycemia: Secondary | ICD-10-CM | POA: Diagnosis not present

## 2023-10-01 DIAGNOSIS — I6501 Occlusion and stenosis of right vertebral artery: Secondary | ICD-10-CM | POA: Diagnosis not present

## 2023-10-01 DIAGNOSIS — E785 Hyperlipidemia, unspecified: Secondary | ICD-10-CM | POA: Diagnosis not present

## 2023-10-01 DIAGNOSIS — I69351 Hemiplegia and hemiparesis following cerebral infarction affecting right dominant side: Secondary | ICD-10-CM | POA: Diagnosis not present

## 2023-10-01 DIAGNOSIS — K5730 Diverticulosis of large intestine without perforation or abscess without bleeding: Secondary | ICD-10-CM | POA: Diagnosis not present

## 2023-10-01 DIAGNOSIS — Z720 Tobacco use: Secondary | ICD-10-CM | POA: Diagnosis not present

## 2023-10-01 DIAGNOSIS — Z794 Long term (current) use of insulin: Secondary | ICD-10-CM | POA: Diagnosis not present

## 2023-10-01 DIAGNOSIS — R9389 Abnormal findings on diagnostic imaging of other specified body structures: Secondary | ICD-10-CM | POA: Diagnosis not present

## 2023-10-01 DIAGNOSIS — N838 Other noninflammatory disorders of ovary, fallopian tube and broad ligament: Secondary | ICD-10-CM | POA: Diagnosis not present

## 2023-10-01 DIAGNOSIS — Z716 Tobacco abuse counseling: Secondary | ICD-10-CM

## 2023-10-01 DIAGNOSIS — E1149 Type 2 diabetes mellitus with other diabetic neurological complication: Secondary | ICD-10-CM | POA: Diagnosis not present

## 2023-10-01 DIAGNOSIS — E559 Vitamin D deficiency, unspecified: Secondary | ICD-10-CM | POA: Diagnosis not present

## 2023-10-01 DIAGNOSIS — Z7984 Long term (current) use of oral hypoglycemic drugs: Secondary | ICD-10-CM | POA: Diagnosis not present

## 2023-10-01 LAB — RAPID URINE DRUG SCREEN, HOSP PERFORMED
Amphetamines: NOT DETECTED
Barbiturates: NOT DETECTED
Benzodiazepines: NOT DETECTED
Cocaine: NOT DETECTED
Opiates: NOT DETECTED
Tetrahydrocannabinol: NOT DETECTED

## 2023-10-01 LAB — PROTIME-INR
INR: 1 (ref 0.8–1.2)
Prothrombin Time: 13.2 s (ref 11.4–15.2)

## 2023-10-01 LAB — DIFFERENTIAL
Abs Immature Granulocytes: 0.04 10*3/uL (ref 0.00–0.07)
Basophils Absolute: 0.1 10*3/uL (ref 0.0–0.1)
Basophils Relative: 1 %
Eosinophils Absolute: 0.1 10*3/uL (ref 0.0–0.5)
Eosinophils Relative: 1 %
Immature Granulocytes: 0 %
Lymphocytes Relative: 15 %
Lymphs Abs: 1.4 10*3/uL (ref 0.7–4.0)
Monocytes Absolute: 0.5 10*3/uL (ref 0.1–1.0)
Monocytes Relative: 5 %
Neutro Abs: 7.5 10*3/uL (ref 1.7–7.7)
Neutrophils Relative %: 78 %

## 2023-10-01 LAB — CBC
HCT: 44 % (ref 36.0–46.0)
Hemoglobin: 15.2 g/dL — ABNORMAL HIGH (ref 12.0–15.0)
MCH: 31.7 pg (ref 26.0–34.0)
MCHC: 34.5 g/dL (ref 30.0–36.0)
MCV: 91.7 fL (ref 80.0–100.0)
Platelets: 243 10*3/uL (ref 150–400)
RBC: 4.8 MIL/uL (ref 3.87–5.11)
RDW: 12.4 % (ref 11.5–15.5)
WBC: 9.6 10*3/uL (ref 4.0–10.5)
nRBC: 0 % (ref 0.0–0.2)

## 2023-10-01 LAB — COMPREHENSIVE METABOLIC PANEL
ALT: 31 U/L (ref 0–44)
AST: 23 U/L (ref 15–41)
Albumin: 3.8 g/dL (ref 3.5–5.0)
Alkaline Phosphatase: 82 U/L (ref 38–126)
Anion gap: 10 (ref 5–15)
BUN: 15 mg/dL (ref 6–20)
CO2: 22 mmol/L (ref 22–32)
Calcium: 8.6 mg/dL — ABNORMAL LOW (ref 8.9–10.3)
Chloride: 100 mmol/L (ref 98–111)
Creatinine, Ser: 0.57 mg/dL (ref 0.44–1.00)
GFR, Estimated: >60 mL/min (ref >60)
Glucose, Bld: 233 mg/dL — ABNORMAL HIGH (ref 70–99)
Potassium: 3.6 mmol/L (ref 3.5–5.1)
Sodium: 132 mmol/L — ABNORMAL LOW (ref 135–145)
Total Bilirubin: 0.5 mg/dL (ref <1.2)
Total Protein: 6.9 g/dL (ref 6.5–8.1)

## 2023-10-01 LAB — MAGNESIUM: Magnesium: 1.9 mg/dL (ref 1.7–2.4)

## 2023-10-01 LAB — URINALYSIS, ROUTINE W REFLEX MICROSCOPIC
Bilirubin Urine: NEGATIVE
Glucose, UA: >=500 mg/dL — AB
Hgb urine dipstick: NEGATIVE
Ketones, ur: NEGATIVE mg/dL
Leukocytes,Ua: NEGATIVE
Nitrite: NEGATIVE
Protein, ur: 30 mg/dL — AB
Specific Gravity, Urine: 1.04 — ABNORMAL HIGH (ref 1.005–1.030)
pH: 7 (ref 5.0–8.0)

## 2023-10-01 LAB — CBG MONITORING, ED: Glucose-Capillary: 234 mg/dL — ABNORMAL HIGH (ref 70–99)

## 2023-10-01 LAB — ETHANOL: Alcohol, Ethyl (B): <10 mg/dL (ref <10)

## 2023-10-01 LAB — APTT: aPTT: 32 s (ref 24–36)

## 2023-10-01 LAB — POC URINE PREG, ED: Preg Test, Ur: NEGATIVE

## 2023-10-01 MED ORDER — ACETAMINOPHEN 160 MG/5ML PO SOLN: 650.0000 mg | ORAL | Status: DC | PRN

## 2023-10-01 MED ORDER — IOHEXOL 350 MG/ML SOLN
75.0000 mL | Freq: Once | INTRAVENOUS | Status: AC | PRN
  Administered 2023-10-01: 75 mL via INTRAVENOUS

## 2023-10-01 MED ORDER — CLOPIDOGREL BISULFATE 75 MG PO TABS
300.0000 mg | ORAL_TABLET | Freq: Once | ORAL | Status: AC
  Administered 2023-10-01: 300 mg via ORAL
  Filled 2023-10-01: qty 4

## 2023-10-01 MED ORDER — ACETAMINOPHEN 325 MG PO TABS
650.0000 mg | ORAL_TABLET | ORAL | Status: DC | PRN
  Filled 2023-10-01: qty 2

## 2023-10-01 MED ORDER — ASPIRIN 81 MG PO TBEC
81.0000 mg | DELAYED_RELEASE_TABLET | Freq: Every day | ORAL | Status: DC
  Administered 2023-10-02 – 2023-10-06 (×5): 81 mg via ORAL
  Filled 2023-10-01 (×5): qty 1

## 2023-10-01 MED ORDER — CLOPIDOGREL BISULFATE 75 MG PO TABS
75.0000 mg | ORAL_TABLET | Freq: Every day | ORAL | Status: DC
  Administered 2023-10-02 – 2023-10-06 (×5): 75 mg via ORAL
  Filled 2023-10-01 (×5): qty 1

## 2023-10-01 MED ORDER — ACETAMINOPHEN 650 MG RE SUPP: 650.0000 mg | RECTAL | Status: DC | PRN

## 2023-10-01 MED ORDER — ORAL CARE MOUTH RINSE: 15.0000 mL | OROMUCOSAL | Status: DC | PRN

## 2023-10-01 MED ORDER — ENOXAPARIN SODIUM 40 MG/0.4ML IJ SOSY
40.0000 mg | PREFILLED_SYRINGE | INTRAMUSCULAR | Status: DC
  Administered 2023-10-01 – 2023-10-05 (×5): 40 mg via SUBCUTANEOUS
  Filled 2023-10-01 (×5): qty 0.4

## 2023-10-01 MED ORDER — ASPIRIN 81 MG PO CHEW
324.0000 mg | CHEWABLE_TABLET | Freq: Once | ORAL | Status: AC
Start: 2023-10-01 — End: 2023-10-01
  Administered 2023-10-01: 324 mg via ORAL
  Filled 2023-10-01: qty 4

## 2023-10-01 MED ORDER — STROKE: EARLY STAGES OF RECOVERY BOOK: Freq: Once | Status: AC

## 2023-10-01 MED ORDER — ATORVASTATIN CALCIUM 40 MG PO TABS
40.0000 mg | ORAL_TABLET | Freq: Every day | ORAL | Status: DC
  Administered 2023-10-02 – 2023-10-06 (×5): 40 mg via ORAL
  Filled 2023-10-01 (×5): qty 1

## 2023-10-01 MED ORDER — SENNOSIDES-DOCUSATE SODIUM 8.6-50 MG PO TABS
1.0000 | ORAL_TABLET | Freq: Every evening | ORAL | Status: DC | PRN
  Administered 2023-10-02: 1 via ORAL
  Filled 2023-10-01: qty 1

## 2023-10-01 MED ORDER — HYDRALAZINE HCL 20 MG/ML IJ SOLN: 10.0000 mg | INTRAMUSCULAR | Status: DC | PRN

## 2023-10-01 NOTE — ED Triage Notes (Signed)
Pt reports she went to bed at 5 am this morning and woke up feeling like she is leaning to the right with intermittent numbness to her foot and hand on the right side.  Pt appears very fatigued and sleepy in triage.

## 2023-10-01 NOTE — Assessment & Plan Note (Signed)
Smokes a pack of cigarettes daily. -Extensively counseled to quit smoking

## 2023-10-01 NOTE — ED Notes (Signed)
Pt states she does not want to do the swallow screen at this time.

## 2023-10-01 NOTE — Assessment & Plan Note (Signed)
Hgba1c

## 2023-10-01 NOTE — Assessment & Plan Note (Signed)
Presenting with numbness to right face and right upper extremity, dizziness.  Risk factors - history of tobacco abuse, hypertension not on medications, prediabetes.  UDS clean.  No similar history of stroke in family.  MRI brain-acute infarct in the right midbrain, other findings representing accelerated chronic microvascular ischemic disease (Pls see detailed report). CT angio head and neck-no emergent large vessel occlusion,The left PCA is small with suspected superimposed moderate to severe stenosis of the P2 PCA which is irregular and diminutive. 3. Moderate bilateral paraclinoid ICA stenosis. 4. Severe right vertebral artery origin stenosis. - Neurology consult in a.m. - EDP talked to neurologist Dr. Iver Nestle, okay to stay here at [facility], aspirin and Plavix, neurology to see in a.m - Lipid panel, hemoglobin A1c in a.m. - Echocardiogram - PT OT speech therapy evaluation - Start atorvastatin 40 mg daily

## 2023-10-01 NOTE — ED Provider Notes (Signed)
  Physical Exam  BP 134/70 (BP Location: Left Arm)   Pulse 66   Temp 97.7 F (36.5 C) (Oral)   Resp 18   Wt 79.4 kg   SpO2 96%   BMI 33.07 kg/m   Physical Exam  Procedures  Procedures  ED Course / MDM   Clinical Course as of 10/01/23 1609  Wed Oct 01, 2023  1552 Assumed care from Dr Wallace Cullens. 47 yo F hx of DM, HTN, and HLD who presented with R forehead and R hand numbness LKW was 0500. Felt like she was leaning to the right as well. Wasn't in TNK window when she presented. VAN negative.  Awaiting MRI and CTA at this time. [RP]    Clinical Course User Index [RP] Rondel Baton, MD   Medical Decision Making Amount and/or Complexity of Data Reviewed Labs: ordered. Radiology: ordered.

## 2023-10-01 NOTE — H&P (Signed)
History and Physical    Melissa Wilkerson DGU:[id number] DOB: 1976/01/13 DOA: 10/01/2023  PCP: Pcp, No   Patient coming from: Home  I have personally briefly reviewed patient's old medical records in [facility]  Chief Complaint: Right face and hand numbness  HPI: Melissa Wilkerson is a 47 y.o. female with medical history significant for HTN, tobacco abuse, prediabetes. Patient presented to the ED with complaints of numbness to the right side of her face and right hand that started about 7 AM this morning.  Family also reports that was leaning to the right today when walking.  Spouse reports patient speech is slow, but no slurred or garbled.  Patient also reports onset of dizziness today.  Patient's son at bedside reports that about a week ago patient was complaining of some abnormal sensation and numbness to her right lower extremity. No prior strokes.  She does not take any medication illicit or prescribed.  She smokes a pack of cigarettes daily.  She is not aware of a family history of stroke.  She reports a history of hypertension but she is not taking any medication for this.  At this time she reports improvement to numbness of her extremity, but persistent numbness to face, and persistent dizziness.  ED Course: Temperature 97.7.  Heart rate 60s.  Respiratory 12-18.  Blood pressure systolic 130s to 956.  UDS clean. CT angio head and neck-no mention to large vessel occlusion,The left PCA is small with suspected superimposed moderate to severe stenosis of the P2 PCA which is irregular and diminutive. 3. Moderate bilateral paraclinoid ICA stenosis. 4. Severe right vertebral artery origin stenosis. MRI brain-acute infarct in the right midbrain, other findings representing accelerated chronic microvascular ischemic disease (Pls see detailed report).  Review of Systems: As per HPI all other systems reviewed and negative.  Past Medical History:  Diagnosis Date   Abdominal bloating 05/26/2015    Breast nodule 03/31/2015   Dyspareunia 05/26/2015   Essential hypertension, benign 09/28/2019   HLD (hyperlipidemia) 09/28/2019   Hypothyroidism, adult 09/28/2019   Vitamin D deficiency disease 09/28/2019    History reviewed. No pertinent surgical history.   reports that she has been smoking cigarettes. She has a 17 pack-year smoking history. She has never used smokeless tobacco. She reports that she does not drink alcohol and does not use drugs.  No Known Allergies  Family History  Problem Relation Age of Onset   Cancer Mother        lung   Diabetes Mother    Hypertension Mother    Cancer Father        throat, tongue   Diabetes Maternal Grandmother    Hypertension Maternal Grandmother    Cancer Paternal Grandfather    Prior to Admission medications   Medication Sig Start Date End Date Taking? Authorizing Provider  acetaminophen (TYLENOL) 500 MG tablet Take 500 mg by mouth every 6 (six) hours as needed for mild pain (pain score 1-3).   Yes [provider]    Physical Exam: Vitals:   10/01/23 1418 10/01/23 1420 10/01/23 1630 10/01/23 1815  BP: 134/70  (!) 170/73   Pulse: 66  63   Resp: 18  12   Temp: 97.7 F (36.5 C)   97.8 F (36.6 C)  TempSrc: Oral   Oral  SpO2: 96%  99%   Weight:  79.4 kg      Constitutional: NAD, calm, comfortable Vitals:   10/01/23 1418 10/01/23 1420 10/01/23 1630 10/01/23 1815  BP: 134/70  Marland Kitchen)  170/73   Pulse: 66  63   Resp: 18  12   Temp: 97.7 F (36.5 C)   97.8 F (36.6 C)  TempSrc: Oral   Oral  SpO2: 96%  99%   Weight:  79.4 kg     Eyes: PERRL, lids and conjunctivae normal ENMT: Mucous membranes are dry.  Neck: normal, supple, no masses, no thyromegaly Respiratory: clear to auscultation bilaterally, no wheezing, no crackles. Normal respiratory effort. No accessory muscle use.  Cardiovascular: Regular rate and rhythm, no murmurs / rubs / gallops. No extremity edema.  Extremities warm Abdomen: no tenderness, no masses  palpated. No hepatosplenomegaly. Bowel sounds positive.  Musculoskeletal: no clubbing / cyanosis. No joint deformity upper and lower extremities. Skin: no rashes, lesions, ulcers. No induration Neurologic:  Neurological:     Mental Status: She is alert.     GCS: GCS eye subscore is 4. GCS verbal subscore is 5. GCS motor subscore is 6.     Comments: Mental Status:  Alert, oriented, thought content appropriate, able to give a coherent history. Speech slow but fluent, not slurred or garbled.  Able to follow 2 step commands without difficulty.  Cranial Nerves:  II:  Peripheral visual fields grossly normal, pupils equal, round, reactive to light III,IV, VI: ptosis not present, extra-ocular motions intact bilaterally  V,VII: Slight flattening of right nasolabial fold on passive exam, not quite obvious on active exam,  facial light touch sensation equal VIII: hearing grossly normal to voice  XI: bilateral shoulder shrug symmetric and strong XII: midline tongue extension without fassiculations Motor:  Normal tone.  5/5 strength bilateral upper and lower extremities, good grip strength bilaterally.   Sensory: Sensation intact to light touch in all extremities.  Cerebellar:   CV: distal pulses palpable throughout  .  Psychiatric: Normal judgment and insight. Alert and oriented x 3. Normal mood.   Labs on Admission: I have personally reviewed following labs and imaging studies  CBC: Recent Labs  Lab 10/01/23 1456  WBC 9.6  NEUTROABS 7.5  HGB 15.2*  HCT 44.0  MCV 91.7  PLT 243   Basic Metabolic Panel: Recent Labs  Lab 10/01/23 1456  NA 132*  K 3.6  CL 100  CO2 22  GLUCOSE 233*  BUN 15  CREATININE 0.57  CALCIUM 8.6*   GFR: CrCl cannot be calculated (Unknown ideal weight.). Liver Function Tests: Recent Labs  Lab 10/01/23 1456  AST 23  ALT 31  ALKPHOS 82  BILITOT 0.5  PROT 6.9  ALBUMIN 3.8   Coagulation Profile: Recent Labs  Lab 10/01/23 1456  INR 1.0    CBG: Recent Labs  Lab 10/01/23 1415  GLUCAP 234*   Urine analysis:    Component Value Date/Time   COLORURINE YELLOW 10/01/2023 1706   APPEARANCEUR CLEAR 10/01/2023 1706   LABSPEC 1.040 (H) 10/01/2023 1706   PHURINE 7.0 10/01/2023 1706   GLUCOSEU >=500 (A) 10/01/2023 1706   HGBUR NEGATIVE 10/01/2023 1706   BILIRUBINUR NEGATIVE 10/01/2023 1706   KETONESUR NEGATIVE 10/01/2023 1706   PROTEINUR 30 (A) 10/01/2023 1706   NITRITE NEGATIVE 10/01/2023 1706   LEUKOCYTESUR NEGATIVE 10/01/2023 1706    Radiological Exams on Admission: MR BRAIN WO CONTRAST  Result Date: 10/01/2023 CLINICAL DATA:  Neuro deficit, acute, stroke suspected EXAM: MRI HEAD WITHOUT CONTRAST TECHNIQUE: Multiplanar, multiecho pulse sequences of the brain and surrounding structures were obtained without intravenous contrast. COMPARISON:  CTA head/neck from today. FINDINGS: Brain: Acute infarct in the right midbrain. Slight edema without mass effect.  Age advanced T2/FLAIR hyperintensities in the white matter. No acute hemorrhage, mass lesion, midline shift or hydrocephalus. Vascular: Major arterial flow voids are maintained skull base. Skull and upper cervical spine: Normal marrow signal. Sinuses/Orbits: Negative. IMPRESSION: 1. Acute infarct in the right midbrain. 2. Moderate T2/FLAIR hyperintensities in the white matter, which are age advanced. These most likely represent accelerated chronic microvascular ischemic disease, but chronic demyelination could have a similar appearance. Electronically Signed   By: Feliberto Harts M.D.   On: 10/01/2023 17:53   CT ANGIO HEAD NECK W WO CM  Result Date: 10/01/2023 CLINICAL DATA:  Neuro deficit, acute, stroke suspected EXAM: CT ANGIOGRAPHY HEAD AND NECK WITH AND WITHOUT CONTRAST TECHNIQUE: Multidetector CT imaging of the head and neck was performed using the standard protocol during bolus administration of intravenous contrast. Multiplanar CT image reconstructions and MIPs were  obtained to evaluate the vascular anatomy. Carotid stenosis measurements (when applicable) are obtained utilizing NASCET criteria, using the distal internal carotid diameter as the denominator. RADIATION DOSE REDUCTION: This exam was performed according to the departmental dose-optimization program which includes automated exposure control, adjustment of the mA and/or kV according to patient size and/or use of iterative reconstruction technique. CONTRAST:  75mL OMNIPAQUE IOHEXOL 350 MG/ML SOLN COMPARISON:  None Available. FINDINGS: CT HEAD FINDINGS Brain: No evidence of acute infarction, hemorrhage, hydrocephalus, extra-axial collection or mass lesion/mass effect. Vascular: See below. Skull: No acute fracture. Sinuses/Orbits: No acute finding. Other: No mastoid effusions. Review of the MIP images confirms the above findings CTA NECK FINDINGS Aortic arch: Great vessel origins are patent without significant stenosis. Right carotid system: No evidence of dissection, stenosis (50% or greater), or occlusion. Left carotid system: No evidence of dissection, stenosis (50% or greater), or occlusion. Vertebral arteries: Severe right vertebral artery origin stenosis. Left vertebral artery is patent. Skeleton: No acute fracture. Other neck: No acute abnormality on limited assessment. Upper chest: Visualized lung apices are clear. Review of the MIP images confirms the above findings CTA HEAD FINDINGS Anterior circulation: Bilateral intracranial ICAs, MCAs and ACAs are patent. Moderate bilateral paraclinoid ICA stenosis Posterior circulation: Bilateral intradural vertebral arteries, basilar artery and bilateral posterior cerebral arteries are patent proximally. The left PCA is small with suspected superimposed moderate stenosis of the P2 PCA which is irregular Venous sinuses: As permitted by contrast timing, patent. Review of the MIP images confirms the above findings IMPRESSION: 1. No emergent large vessel occlusion. 2. The left  PCA is small with suspected superimposed moderate to severe stenosis of the P2 PCA which is irregular and diminutive. 3. Moderate bilateral paraclinoid ICA stenosis. 4. Severe right vertebral artery origin stenosis. Electronically Signed   By: Feliberto Harts M.D.   On: 10/01/2023 17:27    EKG: Independently reviewed.  Sinus rhythm rate 66, QTc 499.  No significant ST wave or T wave abnormalities, no prior EKG to compare.  Assessment/Plan Principal Problem:   Acute CVA (cerebrovascular accident) [facility]) Active Problems:   Essential hypertension, benign   Tobacco abuse   Pre-diabetes  Assessment and Plan: * Acute CVA (cerebrovascular accident) (HCC) Presenting with numbness to right face and right upper extremity, dizziness.  Risk factors - history of tobacco abuse, hypertension not on medications, prediabetes.  UDS clean.  No similar history of stroke in family.  MRI brain-acute infarct in the right midbrain, other findings representing accelerated chronic microvascular ischemic disease (Pls see detailed report). CT angio head and neck-no emergent large vessel occlusion,The left PCA is small with suspected superimposed moderate to  severe stenosis of the P2 PCA which is irregular and diminutive. 3. Moderate bilateral paraclinoid ICA stenosis. 4. Severe right vertebral artery origin stenosis. - Neurology consult in a.m. - EDP talked to neurologist Dr. Iver Nestle, okay to stay here at [facility], aspirin and Plavix, neurology to see in a.m - Lipid panel, hemoglobin A1c in a.m. - Echocardiogram - PT OT speech therapy evaluation - Start atorvastatin 40 mg daily   Pre-diabetes - Hgba1c  Tobacco abuse Smokes a pack of cigarettes daily. -Extensively counseled to quit smoking  Essential hypertension, benign Elevated systolic up to 170s.  History of hypertension she does not take any medications. -Hold hypertensives for now, allow for permissive hypertension in the setting of acute CVA -As needed  hydralazine 10 mg for blood pressure greater than 220/120   DVT prophylaxis: Lovenox Code Status: Full code Family Communication: Spouse and son at bedside. Disposition Plan: ~ 1- 2 days Consults called: Neurology Admission status: Inpt tele I certify that at the point of admission it is my clinical judgment that the patient will require inpatient hospital care spanning beyond 2 midnights from the point of admission due to high intensity of service, high risk for further deterioration and high frequency of surveillance required.   Author: Onnie Boer, MD 10/01/2023 8:59 PM  For on call review [email or web address].

## 2023-10-01 NOTE — ED Provider Notes (Signed)
[place] EMERGENCY DEPARTMENT AT [facility] Provider Note  CSN: [id number] Arrival date & time: 10/01/23 1409  Chief Complaint(s) Gait Problem  HPI Melissa Wilkerson is a 47 y.o. female with past medical history as below, significant for HLD, hypertension, hypothyroid who presents to the ED with complaint of strokelike symptoms.  Last known normal was approximately 5 AM this morning when patient went to bed.  She woke up around 7 and felt she was having some numbness went back to sleep woke up again around 1245 and symptoms had worsened.  Patient reports that she feels somewhat dizzy, reduced sensation on the right side of her face into her arm.  No sensation changes to her legs.  Feels like she is leaning somewhat to the right when she walks.  No speech disturbances.  No recent falls or head injuries,  Past Medical History Past Medical History:  Diagnosis Date   Abdominal bloating 05/26/2015   Breast nodule 03/31/2015   Dyspareunia 05/26/2015   Essential hypertension, benign 09/28/2019   HLD (hyperlipidemia) 09/28/2019   Hypothyroidism, adult 09/28/2019   Vitamin D deficiency disease 09/28/2019   Patient Active Problem List   Diagnosis Date Noted   Encounter for screening fecal occult blood testing 12/01/2020   Encounter for gynecological examination with Papanicolaou smear of cervix 12/01/2020   Smoker 12/01/2020   Cigarette nicotine dependence without complication 09/20/2020   Essential hypertension, benign 09/28/2019   Hypothyroidism, adult 09/28/2019   Vitamin D deficiency disease 09/28/2019   HLD (hyperlipidemia) 09/28/2019   Abdominal bloating 05/26/2015   Dyspareunia 05/26/2015   Breast nodule 03/31/2015   Home Medication(s) Prior to Admission medications   Medication Sig Start Date End Date Taking? Authorizing Provider  Cholecalciferol (VITAMIN D3) 1.25 MG (50000 UT) CAPS Take 2 capsules by mouth daily.     Karilyn Cota, Nimish C, MD  lisinopril (ZESTRIL) 10 MG  tablet Take 1 tablet (10 mg total) by mouth daily. 07/04/21   Elenore Paddy, NP  thyroid (NP THYROID) 90 MG tablet Take 1 tablet (90 mg total) by mouth daily. 07/04/21   Elenore Paddy, NP                                                                                                                                    Past Surgical History History reviewed. No pertinent surgical history. Family History Family History  Problem Relation Age of Onset   Cancer Mother        lung   Diabetes Mother    Hypertension Mother    Cancer Father        throat, tongue   Diabetes Maternal Grandmother    Hypertension Maternal Grandmother    Cancer Paternal Grandfather     Social History Social History   Tobacco Use   Smoking status: Every Day    Current packs/day: 1.00    Average packs/day: 1 pack/day for 17.0 years (17.0  ttl pk-yrs)    Types: Cigarettes   Smokeless tobacco: Never  Vaping Use   Vaping status: Former  Substance Use Topics   Alcohol use: No    Alcohol/week: 0.0 standard drinks of alcohol   Drug use: No   Allergies Patient has no known allergies.  Review of Systems Review of Systems  Respiratory:  Negative for chest tightness and shortness of breath.   Cardiovascular:  Negative for chest pain.  Gastrointestinal:  Negative for abdominal pain.  Musculoskeletal:  Positive for gait problem.  Neurological:  Positive for dizziness and numbness. Negative for syncope and headaches.  All other systems reviewed and are negative.   Physical Exam Vital Signs  I have reviewed the triage vital signs BP 134/70 (BP Location: Left Arm)   Pulse 66   Temp 97.7 F (36.5 C) (Oral)   Resp 18   Wt 79.4 kg   SpO2 96%   BMI 33.07 kg/m  Physical Exam Vitals and nursing note reviewed.  Constitutional:      General: She is not in acute distress.    Appearance: Normal appearance.  HENT:     Head: Normocephalic and atraumatic.     Right Ear: External ear normal.     Left Ear: External  ear normal.     Nose: Nose normal.     Mouth/Throat:     Mouth: Mucous membranes are moist.  Eyes:     General: No visual field deficit or scleral icterus.       Right eye: No discharge.        Left eye: No discharge.     Extraocular Movements: Extraocular movements intact.     Pupils: Pupils are equal, round, and reactive to light.  Cardiovascular:     Rate and Rhythm: Normal rate and regular rhythm.     Pulses: Normal pulses.     Heart sounds: Normal heart sounds.  Pulmonary:     Effort: Pulmonary effort is normal. No respiratory distress.     Breath sounds: Normal breath sounds. No stridor.  Abdominal:     General: Abdomen is flat. There is no distension.     Palpations: Abdomen is soft.     Tenderness: There is no abdominal tenderness.  Musculoskeletal:     Cervical back: No rigidity.     Right lower leg: No edema.     Left lower leg: No edema.  Skin:    General: Skin is warm and dry.     Capillary Refill: Capillary refill takes less than 2 seconds.  Neurological:     Mental Status: She is alert and oriented to person, place, and time.     GCS: GCS eye subscore is 4. GCS verbal subscore is 5. GCS motor subscore is 6.     Cranial Nerves: Facial asymmetry present. No dysarthria.     Sensory: Sensory deficit present.     Motor: Motor function is intact. No weakness, tremor or pronator drift.     Coordination: Coordination is intact.     Comments: Reduced sensation to right forehead and right hand Sensation otherwise symmetric  Abnormal smile, no facial asymmetry when at rest  NIHSS 2  Psychiatric:        Mood and Affect: Mood normal.        Behavior: Behavior normal. Behavior is cooperative.     ED Results and Treatments Labs (all labs ordered are listed, but only abnormal results are displayed) Labs Reviewed  CBC - Abnormal; Notable for the  following components:      Result Value   Hemoglobin 15.2 (*)    All other components within normal limits  COMPREHENSIVE  METABOLIC PANEL - Abnormal; Notable for the following components:   Sodium 132 (*)    Glucose, Bld 233 (*)    Calcium 8.6 (*)    All other components within normal limits  CBG MONITORING, ED - Abnormal; Notable for the following components:   Glucose-Capillary 234 (*)    All other components within normal limits  ETHANOL  PROTIME-INR  APTT  DIFFERENTIAL  RAPID URINE DRUG SCREEN, HOSP PERFORMED  URINALYSIS, ROUTINE W REFLEX MICROSCOPIC  I-STAT CHEM 8, ED  POC URINE PREG, ED                                                                                                                          Radiology No results found.  Pertinent labs & imaging results that were available during my care of the patient were reviewed by me and considered in my medical decision making (see MDM for details).  Medications Ordered in ED Medications - No data to display                                                                                                                                   Procedures .Critical Care  Performed by: Sloan Leiter, DO Authorized by: Sloan Leiter, DO   Critical care provider statement:    Critical care time (minutes):  30   Critical care time was exclusive of:  Separately billable procedures and treating other patients   Critical care was necessary to treat or prevent imminent or life-threatening deterioration of the following conditions:  CNS failure or compromise   Critical care was time spent personally by me on the following activities:  Development of treatment plan with patient or surrogate, discussions with consultants, evaluation of patient's response to treatment, examination of patient, ordering and review of laboratory studies, ordering and review of radiographic studies, ordering and performing treatments and interventions, pulse oximetry, re-evaluation of patient's condition, review of old charts and obtaining history from patient or  surrogate   (including critical care time)  Medical Decision Making / ED Course    Medical Decision Making:    Melissa Wilkerson is a 47 y.o. female with past medical history as below, significant for HLD, hypertension, hypothyroid who presents to the ED  with complaint of strokelike symptoms. The complaint involves an extensive differential diagnosis and also carries with it a high risk of complications and morbidity.  Serious etiology was considered. Ddx includes but is not limited to: Differential diagnoses for altered mental status includes but is not exclusive to alcohol, illicit or prescription medications, intracranial pathology such as stroke, intracerebral hemorrhage, fever or infectious causes including sepsis, hypoxemia, uremia, trauma, endocrine related disorders such as diabetes, hypoglycemia, thyroid-related diseases, etc.   Complete initial physical exam performed, notably the patient  was Melissa Wilkerson negative.    Reviewed and confirmed nursing documentation for past medical history, family history, social history.  Vital signs reviewed.    NIHSS 2 VAN negative Last known normal was 5 AM this morning, outside of treatment window for TNK.  Does not meet criteria for stroke alert activation for ?LVO given she is VAN negative.   Labs stable, imaging pending. Symptoms seem to be improving. Handoff to incoming EDP pending imaging and reassessment.                Additional history obtained: -Additional history obtained from spouse -External records from outside source obtained and reviewed including: Chart review including previous notes, labs, imaging, consultation notes including  Primary care documentation Home medications   Lab Tests: -I ordered, reviewed, and interpreted labs.   The pertinent results include:   Labs Reviewed  CBC - Abnormal; Notable for the following components:      Result Value   Hemoglobin 15.2 (*)    All other components within normal limits   COMPREHENSIVE METABOLIC PANEL - Abnormal; Notable for the following components:   Sodium 132 (*)    Glucose, Bld 233 (*)    Calcium 8.6 (*)    All other components within normal limits  CBG MONITORING, ED - Abnormal; Notable for the following components:   Glucose-Capillary 234 (*)    All other components within normal limits  ETHANOL  PROTIME-INR  APTT  DIFFERENTIAL  RAPID URINE DRUG SCREEN, HOSP PERFORMED  URINALYSIS, ROUTINE W REFLEX MICROSCOPIC  I-STAT CHEM 8, ED  POC URINE PREG, ED    Notable for labs stable  EKG   EKG Interpretation Date/Time:  Wednesday October 01 2023 14:35:17 EST Ventricular Rate:  66 PR Interval:  151 QRS Duration:  103 QT Interval:  476 QTC Calculation: 499 R Axis:   43  Text Interpretation: Sinus rhythm Borderline prolonged QT interval Confirmed by Tanda Rockers ([id number]) on 10/01/2023 3:47:53 PM         Imaging Studies ordered: I ordered imaging studies including CTH CTA MRI  >> PENDING   Medicines ordered and prescription drug management: No orders of the defined types were placed in this encounter.   -I have reviewed the patients home medicines and have made adjustments as needed   Consultations Obtained: na   Cardiac Monitoring: The patient was maintained on a cardiac monitor.  I personally viewed and interpreted the cardiac monitored which showed an underlying rhythm of: nsr Continuous pulse oximetry interpreted by myself, 99% on RA.    Social Determinants of Health:  Diagnosis or treatment significantly limited by social determinants of health: current smoker   Reevaluation: After the interventions noted above, I reevaluated the patient and found that they have improved  Co morbidities that complicate the patient evaluation  Past Medical History:  Diagnosis Date   Abdominal bloating 05/26/2015   Breast nodule 03/31/2015   Dyspareunia 05/26/2015   Essential hypertension, benign 09/28/2019   HLD (hyperlipidemia)  09/28/2019   Hypothyroidism, adult 09/28/2019   Vitamin D deficiency disease 09/28/2019      Dispostion: Disposition decision including need for hospitalization was considered, and patient disposition pending at time of sign out.    Final Clinical Impression(s) / ED Diagnoses Final diagnoses:  Stroke-like symptoms        Sloan Leiter, DO 10/01/23 1621

## 2023-10-01 NOTE — Assessment & Plan Note (Addendum)
Elevated systolic up to 170s.  History of hypertension she does not take any medications. -Hold hypertensives for now, allow for permissive hypertension in the setting of acute CVA -As needed hydralazine 10 mg for blood pressure greater than 220/120

## 2023-10-02 ENCOUNTER — Inpatient Hospital Stay (HOSPITAL_COMMUNITY): Payer: BC Managed Care – PPO

## 2023-10-02 DIAGNOSIS — I6389 Other cerebral infarction: Secondary | ICD-10-CM

## 2023-10-02 DIAGNOSIS — Z720 Tobacco use: Secondary | ICD-10-CM | POA: Diagnosis not present

## 2023-10-02 DIAGNOSIS — I639 Cerebral infarction, unspecified: Principal | ICD-10-CM

## 2023-10-02 DIAGNOSIS — I10 Essential (primary) hypertension: Secondary | ICD-10-CM | POA: Diagnosis not present

## 2023-10-02 LAB — ECHOCARDIOGRAM COMPLETE
AR max vel: 1.99 cm2
AV Area VTI: 1.95 cm2
AV Area mean vel: 1.94 cm2
AV Mean grad: 6 mm[Hg]
AV Peak grad: 10.8 mm[Hg]
Ao pk vel: 1.64 m/s
Area-P 1/2: 3.5 cm2
S' Lateral: 3.5 cm
Weight: 2800 [oz_av]

## 2023-10-02 LAB — LIPID PANEL
Cholesterol: 241 mg/dL — ABNORMAL HIGH (ref 0–200)
HDL: 30 mg/dL — ABNORMAL LOW (ref >40)
LDL Cholesterol: UNDETERMINED mg/dL (ref 0–99)
Total CHOL/HDL Ratio: 8 {ratio}
Triglycerides: 481 mg/dL — ABNORMAL HIGH (ref <150)
VLDL: UNDETERMINED mg/dL (ref 0–40)

## 2023-10-02 LAB — HIV ANTIBODY (ROUTINE TESTING W REFLEX): HIV Screen 4th Generation wRfx: NONREACTIVE

## 2023-10-02 LAB — HEMOGLOBIN A1C
Hgb A1c MFr Bld: 7.4 % — ABNORMAL HIGH (ref 4.8–5.6)
Mean Plasma Glucose: 165.68 mg/dL

## 2023-10-02 LAB — LDL CHOLESTEROL, DIRECT: Direct LDL: 164 mg/dL — ABNORMAL HIGH (ref 0–99)

## 2023-10-02 LAB — ANTITHROMBIN III: AntiThromb III Func: 97 % (ref 75–120)

## 2023-10-02 MED ORDER — IOHEXOL 300 MG/ML  SOLN
25.0000 mL | Freq: Once | INTRAMUSCULAR | Status: AC | PRN
  Administered 2023-10-02: 30 mL via ORAL

## 2023-10-02 MED ORDER — IOHEXOL 300 MG/ML  SOLN
100.0000 mL | Freq: Once | INTRAMUSCULAR | Status: AC | PRN
  Administered 2023-10-02: 100 mL via INTRAVENOUS

## 2023-10-02 NOTE — Plan of Care (Signed)
  Problem: Education: Goal: Knowledge of General Education information will improve Description: Including pain rating scale, medication(s)/side effects and non-pharmacologic comfort measures Outcome: Progressing   Problem: Education: Goal: Knowledge of General Education information will improve Description: Including pain rating scale, medication(s)/side effects and non-pharmacologic comfort measures Outcome: Progressing   Problem: Health Behavior/Discharge Planning: Goal: Ability to manage health-related needs will improve Outcome: Progressing   Problem: Clinical Measurements: Goal: Diagnostic test results will improve Outcome: Progressing   Problem: Activity: Goal: Risk for activity intolerance will decrease Outcome: Progressing   Problem: Pain Management: Goal: General experience of comfort will improve Outcome: Progressing

## 2023-10-02 NOTE — Plan of Care (Signed)
  Problem: Acute Rehab PT Goals(only PT should resolve) Goal: Pt Will Go Supine/Side To Sit Outcome: Progressing Flowsheets (Taken 10/02/2023 1153) Pt will go Supine/Side to Sit:  Independently  with modified independence Goal: Patient Will Transfer Sit To/From Stand Outcome: Progressing Flowsheets (Taken 10/02/2023 1153) Patient will transfer sit to/from stand: with supervision Goal: Pt Will Transfer Bed To Chair/Chair To Bed Outcome: Progressing Flowsheets (Taken 10/02/2023 1153) Pt will Transfer Bed to Chair/Chair to Bed: with supervision Goal: Pt Will Ambulate Outcome: Progressing Flowsheets (Taken 10/02/2023 1153) Pt will Ambulate:  75 feet  with supervision  with contact guard assist  with rolling walker  with least restrictive assistive device   11:53 AM, 10/02/23 Ocie Bob, MPT Physical Therapist with [facility] [phone] office [phone] mobile phone

## 2023-10-02 NOTE — Progress Notes (Signed)
During BP check the patients left arm turned abnormally red from the shoulder down.  Patient didn't complain of pain and stated that the overall sensation was closer to equal than before (in arms, legs and face).

## 2023-10-02 NOTE — TOC CM/SW Note (Addendum)
Transition of Care [facility]) - Inpatient Brief Assessment   Patient Details  Name: Melissa Wilkerson MRN: [id number] Date of Birth: 1975/11/27  Transition of Care [facility]) CM/SW Contact:    Villa Herb, LCSWA Phone Number: 10/02/2023, 11:36 AM   Clinical Narrative: TOC notes PT recommendation for CIR. TOC to follow for additional needs.   Transition of Care Asessment: Insurance and Status: Insurance coverage has been reviewed Patient has primary care physician: Yes Home environment has been reviewed: From home Prior level of function:: independent Prior/Current Home Services: No current home services Social Determinants of Health Reivew: SDOH reviewed no interventions necessary Readmission risk has been reviewed: Yes Transition of care needs: no transition of care needs at this time

## 2023-10-02 NOTE — Evaluation (Signed)
Physical Therapy Evaluation Patient Details Name: Melissa Wilkerson MRN: [id number] DOB: November 13, 1976 Today's Date: 10/02/2023  History of Present Illness  Melissa Wilkerson is a 47 y.o. female with medical history significant for HTN, tobacco abuse, prediabetes.  Patient presented to the ED with complaints of numbness to the right side of her face and right hand that started about 7 AM this morning.  Family also reports that was leaning to the right today when walking.  Spouse reports patient speech is slow, but no slurred or garbled.  Patient also reports onset of dizziness today.  Patient's son at bedside reports that about a week ago patient was complaining of some abnormal sensation and numbness to her right lower extremity.  No prior strokes.  She does not take any medication illicit or prescribed.  She smokes a pack of cigarettes daily.  She is not aware of a family history of stroke.  She reports a history of hypertension but she is not taking any medication for this.   Clinical Impression  Patient demonstrates slightly labored movement for sitting up at bedside without requiring assistance, very unsteady on feet having to reach out for support /guarding  with arms when taking steps without AD, had to use RW for safety and had frequent incoordination of LLE with scissoring across midline requiring verbal /tactile cueing to avoid loss balance.  Patient limited for ambulation due to fatigue and required sitting rest break in w/c before walking back to room.  Patient tolerated sitting up in chair after therapy with her spouse present.  Patient will benefit from continued skilled physical therapy in hospital and recommended venue below to increase strength, balance, endurance for safe ADLs and gait.        If plan is discharge home, recommend the following: A lot of help with walking and/or transfers;A little help with bathing/dressing/bathroom;Help with stairs or ramp for entrance;Assistance with  cooking/housework   Can travel by private vehicle    Yes    Equipment Recommendations Rolling walker (2 wheels)  Recommendations for Other Services       Functional Status Assessment Patient has had a recent decline in their functional status and demonstrates the ability to make significant improvements in function in a reasonable and predictable amount of time.     Precautions / Restrictions Precautions Precautions: Fall Restrictions Weight Bearing Restrictions: No      Mobility  Bed Mobility Overal bed mobility: Modified Independent                  Transfers Overall transfer level: Needs assistance Equipment used: None, Rolling walker (2 wheels) Transfers: Sit to/from Stand, Bed to chair/wheelchair/BSC Sit to Stand: Contact guard assist   Step pivot transfers: Contact guard assist, Min assist       General transfer comment: unsteady labored movement having to lean on nearby objects for support without AD, required use of RW for safety    Ambulation/Gait Ambulation/Gait assistance: Min assist, Mod assist Gait Distance (Feet): 40 Feet Assistive device: None, Rolling walker (2 wheels) Gait Pattern/deviations: Decreased step length - right, Decreased step length - left, Decreased stride length, Antalgic, Ataxic, Scissoring Gait velocity: slow     General Gait Details: slow labored movement, decreased arm swing due to guarding reaching out to sides for support to maintain balance, required use of RW for safety, frequent scissoring of RLE with ataxic like gait and limited mostly due to c/o fatigue, right sided weakness  Stairs  Wheelchair Mobility     Tilt Bed    Modified Rankin (Stroke Patients Only)       Balance Overall balance assessment: Needs assistance Sitting-balance support: Feet supported, No upper extremity supported Sitting balance-Leahy Scale: Good Sitting balance - Comments: seated at EOB   Standing balance support:  During functional activity, No upper extremity supported Standing balance-Leahy Scale: Poor Standing balance comment: fair/poor using RW                             Pertinent Vitals/Pain Pain Assessment Pain Assessment: No/denies pain    Home Living Family/patient expects to be discharged to:: Private residence Living Arrangements: Spouse/significant other Available Help at Discharge: Family;Other (Comment);Available 24 hours/day Type of Home: House Home Access: Stairs to enter Entrance Stairs-Rails: None Entrance Stairs-Number of Steps: 2   Home Layout: Two level;Able to live on main level with bedroom/bathroom;Laundry or work area in [employer] Equipment: Agricultural consultant (2 wheels);Shower seat      Prior Function Prior Level of Function : Independent/Modified Independent             Mobility Comments: Independent community ambulator without AD, drives, shops ADLs Comments: Independent     Extremity/Trunk Assessment   Upper Extremity Assessment Upper Extremity Assessment: Defer to OT evaluation    Lower Extremity Assessment Lower Extremity Assessment: Generalized weakness;RLE deficits/detail RLE Deficits / Details: grossly 3+/5 RLE Sensation: WNL RLE Coordination: decreased gross motor    Cervical / Trunk Assessment Cervical / Trunk Assessment: Normal  Communication   Communication Communication: No apparent difficulties  Cognition Arousal: Alert Behavior During Therapy: WFL for tasks assessed/performed Overall Cognitive Status: Within Functional Limits for tasks assessed                                          General Comments      Exercises     Assessment/Plan    PT Assessment Patient needs continued PT services  PT Problem List Decreased strength;Decreased activity tolerance;Decreased balance;Decreased mobility;Decreased coordination       PT Treatment Interventions DME instruction;Gait training;Stair  training;Functional mobility training;Therapeutic activities;Therapeutic exercise;Balance training;Patient/family education;Neuromuscular re-education    PT Goals (Current goals can be found in the Care Plan section)  Acute Rehab PT Goals Patient Stated Goal: return home with family to assist PT Goal Formulation: With patient/family Time For Goal Achievement: 10/16/23 Potential to Achieve Goals: Good    Frequency Min 4X/week     Co-evaluation PT/OT/SLP Co-Evaluation/Treatment: Yes Reason for Co-Treatment: To address functional/ADL transfers PT goals addressed during session: Mobility/safety with mobility;Balance;Proper use of DME OT goals addressed during session: ADL's and self-care       AM-PAC PT "6 Clicks" Mobility  Outcome Measure Help needed turning from your back to your side while in a flat bed without using bedrails?: None Help needed moving from lying on your back to sitting on the side of a flat bed without using bedrails?: None Help needed moving to and from a bed to a chair (including a wheelchair)?: A Little Help needed standing up from a chair using your arms (e.g., wheelchair or bedside chair)?: A Little Help needed to walk in hospital room?: A Lot Help needed climbing 3-5 steps with a railing? : A Lot 6 Click Score: 18    End of Session   Activity Tolerance: Patient tolerated treatment well;Patient  limited by fatigue Patient left: in chair;with call bell/phone within reach;with family/visitor present Nurse Communication: Mobility status PT Visit Diagnosis: Unsteadiness on feet (R26.81);Other abnormalities of gait and mobility (R26.89);Muscle weakness (generalized) (M62.81)    Time: 1610-9604 PT Time Calculation (min) (ACUTE ONLY): 30 min   Charges:   PT Evaluation $PT Eval Moderate Complexity: 1 Mod PT Treatments $Therapeutic Activity: 23-37 mins PT General Charges $$ ACUTE PT VISIT: 1 Visit         11:45 AM, 10/02/23 Ocie Bob,  MPT Physical Therapist with [facility] [phone] office [phone] mobile phone

## 2023-10-02 NOTE — Progress Notes (Signed)
SLP Cancellation Note  Patient Details Name: Melissa Wilkerson MRN: [id number] DOB: February 09, 1976   Cancelled treatment:       Reason Eval/Treat Not Completed: SLP screened, no needs identified, will sign off   Amaris Garrette 10/02/2023, 4:21 PM

## 2023-10-02 NOTE — Progress Notes (Signed)
  Echocardiogram 2D Echocardiogram has been performed.  Melissa Wilkerson 10/02/2023, 9:58 AM

## 2023-10-02 NOTE — Progress Notes (Signed)
Inpatient Rehab Admissions Coordinator Note:   Per therapy recommendations patient was screened for CIR candidacy by Stephania Fragmin, PT. At this time, pt appears to be a potential candidate for CIR. I will place an order for rehab consult for full assessment, per our protocol.  Please contact me any with questions.Estill Dooms, PT, DPT [phone] 10/02/23 12:04 PM

## 2023-10-02 NOTE — Evaluation (Signed)
Occupational Therapy Evaluation Patient Details Name: Melissa Wilkerson MRN: [id number] DOB: 13-Sep-1976 Today's Date: 10/02/2023   History of Present Illness Melissa Wilkerson is a 47 y.o. female with medical history significant for HTN, tobacco abuse, prediabetes.  Patient presented to the ED with complaints of numbness to the right side of her face and right hand that started about 7 AM this morning.  Family also reports that was leaning to the right today when walking.  Spouse reports patient speech is slow, but no slurred or garbled.  Patient also reports onset of dizziness today.  Patient's son at bedside reports that about a week ago patient was complaining of some abnormal sensation and numbness to her right lower extremity.  No prior strokes.  She does not take any medication illicit or prescribed.  She smokes a pack of cigarettes daily.  She is not aware of a family history of stroke.  She reports a history of hypertension but she is not taking any medication for this. (per MD)   Clinical Impression   Pt agreeable to OT and PT co-evaluation. Pt reports independence at baseline. Today pt could complete seated ADL's with mod I to set up level of assist. Standing and ambulating tasks were more difficulty due to pt's poor balance and R LE coordination/endurance. B UE WFL overall. Primarily limitation seems to be dizziness and R LE strength and coordination. Pt has family at home and should be able to have close to 24/7 assist between family members. Pt left in the chair with family present.        If plan is discharge home, recommend the following: A little help with walking and/or transfers;A little help with bathing/dressing/bathroom;Assistance with cooking/housework;Assist for transportation;Help with stairs or ramp for entrance    Functional Status Assessment  Patient has had a recent decline in their functional status and demonstrates the ability to make significant improvements in function in  a reasonable and predictable amount of time.  Equipment Recommendations  None recommended by OT           Precautions / Restrictions Precautions Precautions: Fall Restrictions Weight Bearing Restrictions: No      Mobility Bed Mobility Overal bed mobility: Modified Independent                  Transfers Overall transfer level: Needs assistance   Transfers: Sit to/from Stand, Bed to chair/wheelchair/BSC Sit to Stand: Contact guard assist     Step pivot transfers: Contact guard assist, Min assist     General transfer comment: Unsteady in standing without RW; needing to lean on chair for balance.      Balance Overall balance assessment: Needs assistance Sitting-balance support: No upper extremity supported, Feet supported Sitting balance-Leahy Scale: Good Sitting balance - Comments: seated at EOB   Standing balance support: During functional activity, Bilateral upper extremity supported, Reliant on assistive device for balance Standing balance-Leahy Scale: Poor Standing balance comment: poor to fair with RW; poor without                           ADL either performed or assessed with clinical judgement   ADL Overall ADL's : Needs assistance/impaired Eating/Feeding: Modified independent;Sitting   Grooming: Minimal assistance;Contact guard assist;Standing   Upper Body Bathing: Modified independent;Sitting   Lower Body Bathing: Set up;Sitting/lateral leans   Upper Body Dressing : Modified independent;Sitting   Lower Body Dressing: Set up;Sitting/lateral leans   Toilet Transfer: Minimal assistance;Contact guard assist;Stand-pivot;Rolling  walker (2 wheels) Toilet Transfer Details (indicate cue type and reason): Simualted via EOB to chair transfer. Toileting- Clothing Manipulation and Hygiene: Set up;Sitting/lateral lean   Tub/ Shower Transfer: Contact guard assist;Minimal assistance;Rolling walker (2 wheels)   Functional mobility during ADLs:  Contact guard assist;Minimal assistance;Rolling walker (2 wheels) General ADL Comments: Able to ambualte briefly in the hall but very unsteady without RW and still slighlty unsteady with RW.     Vision Baseline Vision/History: 1 Wears glasses Ability to See in Adequate Light: 1 Impaired Patient Visual Report: Blurring of vision;Other (comment) (normal now) Vision Assessment?: No apparent visual deficits     Perception Perception: Not tested       Praxis Praxis: WFL       Pertinent Vitals/Pain Pain Assessment Pain Assessment: No/denies pain     Extremity/Trunk Assessment Upper Extremity Assessment Upper Extremity Assessment: Overall WFL for tasks assessed;Right hand dominant (mild weakness in L wrist; possibly baseline.)   Lower Extremity Assessment Lower Extremity Assessment: Defer to PT evaluation   Cervical / Trunk Assessment Cervical / Trunk Assessment: Normal   Communication Communication Communication: No apparent difficulties   Cognition Arousal: Alert Behavior During Therapy: WFL for tasks assessed/performed Overall Cognitive Status: Within Functional Limits for tasks assessed                                                        Home Living Family/patient expects to be discharged to:: Private residence Living Arrangements: Spouse/significant other Available Help at Discharge: Family;Other (Comment);Available 24 hours/day (near 24/7 assist between son and husband.) Type of Home: House Home Access: Stairs to enter [employer] of Steps: 2 Entrance Stairs-Rails: None Home Layout: Two level;Able to live on main level with bedroom/bathroom;Laundry or work area in basement     [employer] Shower/Tub: Chief Strategy Officer: Standard (lightly raised) Bathroom Accessibility: No   Home Equipment: Agricultural consultant (2 wheels);Shower seat          Prior Functioning/Environment Prior Level of Function :  Independent/Modified Independent             Mobility Comments: Independent ADLs Comments: Independent        OT Problem List: Decreased strength;Decreased activity tolerance;Impaired balance (sitting and/or standing);Decreased coordination      OT Treatment/Interventions: Self-care/ADL training;Therapeutic exercise;Therapeutic activities;Neuromuscular education;DME and/or AE instruction;Patient/family education    OT Goals(Current goals can be found in the care plan section) Acute Rehab OT Goals Patient Stated Goal: return home OT Goal Formulation: With patient/family Time For Goal Achievement: 10/16/23 Potential to Achieve Goals: Good  OT Frequency: Min 2X/week    Co-evaluation PT/OT/SLP Co-Evaluation/Treatment: Yes Reason for Co-Treatment: To address functional/ADL transfers   OT goals addressed during session: ADL's and self-care                       End of Session Equipment Utilized During Treatment: Rolling walker (2 wheels);Gait belt  Activity Tolerance: Patient tolerated treatment well Patient left: in chair;with call bell/phone within reach;with family/visitor present  OT Visit Diagnosis: Unsteadiness on feet (R26.81);Other abnormalities of gait and mobility (R26.89);Muscle weakness (generalized) (M62.81);Other symptoms and signs involving the nervous system (R29.898);Dizziness and giddiness (R42)                Time: 1610-9604 OT Time Calculation (min): 23 min Charges:  OT General Charges $OT Visit: 1 Visit OT Evaluation $OT Eval Low Complexity: 1 Low  Navika Hoopes OT, MOT  Danie Chandler 10/02/2023, 9:33 AM

## 2023-10-02 NOTE — Plan of Care (Signed)
  Problem: Activity: Goal: Risk for activity intolerance will decrease Outcome: Progressing   Problem: Ischemic Stroke/TIA Tissue Perfusion: Goal: Complications of ischemic stroke/TIA will be minimized Outcome: Progressing

## 2023-10-02 NOTE — Progress Notes (Signed)
PROGRESS NOTE  Melissa Wilkerson WUJ:[id number] DOB: Mar 22, 1976 DOA: 10/01/2023 PCP: Pcp, No  Brief History:  47 y/o female with a history of hypertension, tobacco abuse, and diabetes presenting with numbness on her right face and right hand up to her elbow that began at 7 AM on 10/01/2023.  The patient states that she had some numbness in her right foot up to her right knee about a week prior to this admission lasting " a few minutes".  She states that on and off for the past week she has noted that she has been " leaning to the right" with ambulation.  However she denies any frank leg weakness or falling.  She has had some dizziness but denies any visual disturbance, syncope, chest pain, shortness of breath.  She has not had any fevers, chills, nausea, vomiting or diarrhea, abdominal pain.  Spouse reported that the patient speech has been slow, but there has been no dysarthria.  Because of her right-sided numbness and dizziness, the patient presented for further evaluation and treatment. In the ED, the patient was afebrile and hemodynamically stable with oxygen saturation 99% room air.  WBC 9.6, hemoglobin 15.2, platelets 243.  Sodium 132, potassium 3.6, bicarbonate 22, serum creatinine 0.57.  LFTs unremarkable.  UA was negative for pyuria.  Urine drug screen was negative.  MRI brain showed an acute infarct in the right midbrain.  CTA head and neck was negative for LVO.  Showed moderate to severe stenosis of the P2 PCA.  The patient was started on aspirin and Plavix.  She was admitted for further evaluation and treatment of her stroke.   Assessment/Plan: Acute ischemic stroke -Appreciate Neurology Consult -PT/OT evaluation -Speech therapy eval -MRI brain--acute infarct right midbrain -CTA H&N--negative LVO.  Moderate severe stenosis P2 PCA, moderate bilateral paraclinoid ICA stenosis.  Severe right vertebral artery stenosis -Echo-- -LDL--unable to calculate secondary to elevated  triglycerides -HbA1C--pending -Antiplatelet--ASA 81 mg and plavix 75 mg daily  Essential hypertension -not on any medications as an outpatient -Allowing for permissive hypertension temporarily -Plan to start antihypertensive medications at time of discharge  Mixed hyperlipidemia -Unable to calculate LDL secondary to elevated triglycerides -Triglycerides 481 -Total cholesterol 241  Impaired glucose tolerance -Hemoglobin A1c pending -03/03/2021 hemoglobin A1c 6.2  Tobacco abuse -Tobacco cessation discussed           Family Communication:  no Family at bedside  Consultants:  neurology  Code Status:  FULL   DVT Prophylaxis:Orick Lovenox   Procedures: As Listed in Progress Note Above  Antibiotics: None       Subjective: Patient denies fevers, chills, headache, chest pain, dyspnea, nausea, vomiting, diarrhea, abdominal pain, dysuria, hematuria, hematochezia, and melena.   Objective: Vitals:   10/02/23 0003 10/02/23 0159 10/02/23 0425 10/02/23 0629  BP: (!) 157/65 (!) 146/63 (!) 141/59 (!) 146/72  Pulse: 66 69 67 69  Resp: 17 17 16 14   Temp: 98.1 F (36.7 C) 98.2 F (36.8 C)    TempSrc: Oral Oral    SpO2: 99% 97% 99% 98%  Weight:        Intake/Output Summary (Last 24 hours) at 10/02/2023 0705 Last data filed at 10/02/2023 0300 Gross per 24 hour  Intake 240 ml  Output 500 ml  Net -260 ml   Weight change:  Exam:  General:  Pt is alert, follows commands appropriately, not in acute distress HEENT: No icterus, No thrush, No neck mass, /AT Cardiovascular: RRR, S1/S2, no rubs,  no gallops Respiratory: CTA bilaterally, no wheezing, no crackles, no rhonchi Abdomen: Soft/+BS, non tender, non distended, no guarding Extremities: No edema, No lymphangitis, No petechiae, No rashes, no synovitis Neuro:  CN II-XII intact, strength 4/5 in RUE, RLE, strength 4/5 LUE, LLE; sensation intact bilateral; no dysmetria; babinski equivocal    Data Reviewed: I have  personally reviewed following labs and imaging studies Basic Metabolic Panel: Recent Labs  Lab 10/01/23 1456  NA 132*  K 3.6  CL 100  CO2 22  GLUCOSE 233*  BUN 15  CREATININE 0.57  CALCIUM 8.6*  MG 1.9   Liver Function Tests: Recent Labs  Lab 10/01/23 1456  AST 23  ALT 31  ALKPHOS 82  BILITOT 0.5  PROT 6.9  ALBUMIN 3.8   No results for input(s): "LIPASE", "AMYLASE" in the last 168 hours. No results for input(s): "AMMONIA" in the last 168 hours. Coagulation Profile: Recent Labs  Lab 10/01/23 1456  INR 1.0   CBC: Recent Labs  Lab 10/01/23 1456  WBC 9.6  NEUTROABS 7.5  HGB 15.2*  HCT 44.0  MCV 91.7  PLT 243   Cardiac Enzymes: No results for input(s): "CKTOTAL", "CKMB", "CKMBINDEX", "TROPONINI" in the last 168 hours. BNP: Invalid input(s): "POCBNP" CBG: Recent Labs  Lab 10/01/23 1415  GLUCAP 234*   HbA1C: No results for input(s): "HGBA1C" in the last 72 hours. Urine analysis:    Component Value Date/Time   COLORURINE YELLOW 10/01/2023 1706   APPEARANCEUR CLEAR 10/01/2023 1706   LABSPEC 1.040 (H) 10/01/2023 1706   PHURINE 7.0 10/01/2023 1706   GLUCOSEU >=500 (A) 10/01/2023 1706   HGBUR NEGATIVE 10/01/2023 1706   BILIRUBINUR NEGATIVE 10/01/2023 1706   KETONESUR NEGATIVE 10/01/2023 1706   PROTEINUR 30 (A) 10/01/2023 1706   NITRITE NEGATIVE 10/01/2023 1706   LEUKOCYTESUR NEGATIVE 10/01/2023 1706   Sepsis Labs: @LABRCNTIP (procalcitonin:4,lacticidven:4) )No results found for this or any previous visit (from the past 240 hour(s)).   Scheduled Meds:   stroke: early stages of recovery book   Does not apply Once   aspirin EC  81 mg Oral Daily   atorvastatin  40 mg Oral Daily   clopidogrel  75 mg Oral Daily   enoxaparin (LOVENOX) injection  40 mg Subcutaneous Q24H   Continuous Infusions:  Procedures/Studies: MR BRAIN WO CONTRAST  Addendum Date: 10/01/2023   ADDENDUM REPORT: 10/01/2023 21:00 ADDENDUM: Correction: The infarct is in the medulla,  not the midbrain. Electronically Signed   By: Feliberto Harts M.D.   On: 10/01/2023 21:00   Result Date: 10/01/2023 CLINICAL DATA:  Neuro deficit, acute, stroke suspected EXAM: MRI HEAD WITHOUT CONTRAST TECHNIQUE: Multiplanar, multiecho pulse sequences of the brain and surrounding structures were obtained without intravenous contrast. COMPARISON:  CTA head/neck from today. FINDINGS: Brain: Acute infarct in the right midbrain. Slight edema without mass effect. Age advanced T2/FLAIR hyperintensities in the white matter. No acute hemorrhage, mass lesion, midline shift or hydrocephalus. Vascular: Major arterial flow voids are maintained skull base. Skull and upper cervical spine: Normal marrow signal. Sinuses/Orbits: Negative. IMPRESSION: 1. Acute infarct in the right midbrain. 2. Moderate T2/FLAIR hyperintensities in the white matter, which are age advanced. These most likely represent accelerated chronic microvascular ischemic disease, but chronic demyelination could have a similar appearance. Electronically Signed: By: Feliberto Harts M.D. On: 10/01/2023 17:53   CT ANGIO HEAD NECK W WO CM  Result Date: 10/01/2023 CLINICAL DATA:  Neuro deficit, acute, stroke suspected EXAM: CT ANGIOGRAPHY HEAD AND NECK WITH AND WITHOUT CONTRAST TECHNIQUE: Multidetector  CT imaging of the head and neck was performed using the standard protocol during bolus administration of intravenous contrast. Multiplanar CT image reconstructions and MIPs were obtained to evaluate the vascular anatomy. Carotid stenosis measurements (when applicable) are obtained utilizing NASCET criteria, using the distal internal carotid diameter as the denominator. RADIATION DOSE REDUCTION: This exam was performed according to the departmental dose-optimization program which includes automated exposure control, adjustment of the mA and/or kV according to patient size and/or use of iterative reconstruction technique. CONTRAST:  75mL OMNIPAQUE IOHEXOL 350  MG/ML SOLN COMPARISON:  None Available. FINDINGS: CT HEAD FINDINGS Brain: No evidence of acute infarction, hemorrhage, hydrocephalus, extra-axial collection or mass lesion/mass effect. Vascular: See below. Skull: No acute fracture. Sinuses/Orbits: No acute finding. Other: No mastoid effusions. Review of the MIP images confirms the above findings CTA NECK FINDINGS Aortic arch: Great vessel origins are patent without significant stenosis. Right carotid system: No evidence of dissection, stenosis (50% or greater), or occlusion. Left carotid system: No evidence of dissection, stenosis (50% or greater), or occlusion. Vertebral arteries: Severe right vertebral artery origin stenosis. Left vertebral artery is patent. Skeleton: No acute fracture. Other neck: No acute abnormality on limited assessment. Upper chest: Visualized lung apices are clear. Review of the MIP images confirms the above findings CTA HEAD FINDINGS Anterior circulation: Bilateral intracranial ICAs, MCAs and ACAs are patent. Moderate bilateral paraclinoid ICA stenosis Posterior circulation: Bilateral intradural vertebral arteries, basilar artery and bilateral posterior cerebral arteries are patent proximally. The left PCA is small with suspected superimposed moderate stenosis of the P2 PCA which is irregular Venous sinuses: As permitted by contrast timing, patent. Review of the MIP images confirms the above findings IMPRESSION: 1. No emergent large vessel occlusion. 2. The left PCA is small with suspected superimposed moderate to severe stenosis of the P2 PCA which is irregular and diminutive. 3. Moderate bilateral paraclinoid ICA stenosis. 4. Severe right vertebral artery origin stenosis. Electronically Signed   By: Feliberto Harts M.D.   On: 10/01/2023 17:27    Catarina Hartshorn, DO  [facility]  If 7PM-7AM, please contact night-coverage [email or web address] Password [email or web address] 10/02/2023, 7:05 AM   LOS: 1 day

## 2023-10-02 NOTE — Plan of Care (Signed)
  Problem: Acute Rehab OT Goals (only OT should resolve) Goal: Pt. Will Perform Grooming Flowsheets (Taken 10/02/2023 0936) Pt Will Perform Grooming:  with modified independence  standing Goal: Pt. Will Perform Lower Body Dressing Flowsheets (Taken 10/02/2023 0936) Pt Will Perform Lower Body Dressing:  with modified independence  sit to/from stand Goal: Pt. Will Transfer To Toilet Flowsheets (Taken 10/02/2023 [phone]) Pt Will Transfer to Toilet:  with modified independence  ambulating Goal: Pt. Will Perform Toileting-Clothing Manipulation Flowsheets (Taken 10/02/2023 0936) Pt Will Perform Toileting - Clothing Manipulation and hygiene:  Independently  sit to/from stand  [employer] OT, MOT

## 2023-10-02 NOTE — Hospital Course (Addendum)
47 y/o female with a history of hypertension, tobacco abuse, and diabetes presenting with numbness on her right face and right hand up to her elbow that began at 7 AM on 10/01/2023.  The patient states that she had some numbness in her right foot up to her right knee about a week prior to this admission lasting " a few minutes".  She states that on and off for the past week she has noted that she has been " leaning to the right" with ambulation.  However she denies any frank leg weakness or falling.  She has had some dizziness but denies any visual disturbance, syncope, chest pain, shortness of breath.  She has not had any fevers, chills, nausea, vomiting or diarrhea, abdominal pain.  Spouse reported that the patient speech has been slow, but there has been no dysarthria.  Because of her right-sided numbness and dizziness, the patient presented for further evaluation and treatment. In the ED, the patient was afebrile and hemodynamically stable with oxygen saturation 99% room air.  WBC 9.6, hemoglobin 15.2, platelets 243.  Sodium 132, potassium 3.6, bicarbonate 22, serum creatinine 0.57.  LFTs unremarkable.  UA was negative for pyuria.  Urine drug screen was negative.  MRI brain showed an acute infarct in the right midbrain.  CTA head and neck was negative for LVO.  Showed moderate to severe stenosis of the P2 PCA.  The patient was started on aspirin and Plavix.  She was admitted for further evaluation and treatment of her stroke. Neurology was consulted and recommended DAPT x 3 weeks followed by ASA 81 mg  daily.  CT CAP was ordered by neurology with results discussed below.  PT recommended CIR

## 2023-10-02 NOTE — Consult Note (Signed)
I connected with  Danella Sensing on 10/02/23 by a video enabled telemedicine application and verified that I am speaking with the correct person using two identifiers.   I discussed the limitations of evaluation and management by telemedicine. The patient expressed understanding and agreed to proceed.  Location of patient; [facility] Location of physician: [facility]  Neurology Consultation Reason for Consult: stroke Referring Physician: Dr Kennis Carina  CC: Right face and hand numbness  History is obtained from:patient, chart review  HPI: Melissa Wilkerson is a 47 y.o. female with past medical history of hypertension, hyperlipidemia, nicotine use disorder, prediabetes who presented with numbness on the right side of face and hand as well as dizziness. States symptoms started around 7 AM.  Of note patient denies similar symptoms in the past.  However per chart review patient's son states patient had similar symptoms in her right lower extremity about a week ago.  Last known normal: 10/01/2023 7 AM Event happened at home No tPA as outside window No thrombectomy neurological occlusion mRS 0  ROS: All other systems reviewed and negative except as noted in the HPI.   Past Medical History:  Diagnosis Date   Abdominal bloating 05/26/2015   Breast nodule 03/31/2015   Dyspareunia 05/26/2015   Essential hypertension, benign 09/28/2019   HLD (hyperlipidemia) 09/28/2019   Hypothyroidism, adult 09/28/2019   Vitamin D deficiency disease 09/28/2019    Family History  Problem Relation Age of Onset   Cancer Mother        lung   Diabetes Mother    Hypertension Mother    Cancer Father        throat, tongue   Diabetes Maternal Grandmother    Hypertension Maternal Grandmother    Cancer Paternal Grandfather     Social History:  reports that she has been smoking cigarettes. She has a 17 pack-year smoking history. She has never used smokeless tobacco. She reports that she does not drink  alcohol and does not use drugs.   Medications Prior to Admission  Medication Sig Dispense Refill Last Dose   acetaminophen (TYLENOL) 500 MG tablet Take 500 mg by mouth every 6 (six) hours as needed for mild pain (pain score 1-3).   10/01/2023 at 0700      Exam: Current vital signs: BP (!) 146/72 (BP Location: Left Arm)   Pulse 69   Temp 98.2 F (36.8 C) (Oral)   Resp 14   Wt 79.4 kg   SpO2 98%   BMI 33.07 kg/m  Vital signs in last 24 hours: Temp:  [97.5 F (36.4 C)-98.2 F (36.8 C)] 98.2 F (36.8 C) (11/14 0159) Pulse Rate:  [63-69] 69 (11/14 0629) Resp:  [12-20] 14 (11/14 0629) BP: (134-178)/(59-83) 146/72 (11/14 0629) SpO2:  [96 %-99 %] 98 % (11/14 0629) Weight:  [79.4 kg] 79.4 kg (11/13 1420)   Physical Exam  Constitutional: Appears well-developed and well-nourished.  Psych: Affect appropriate to situation Neuro: AOx3, no no aphasia, cranial nerves appear grossly intact except numbness on right side of face, antigravity send without drift in all 4 extremities, sensory intact to light touch except right upper extremity, FTN intact bilaterally  NIHSS 1   INPUTS: 1A: Level of consciousness --> 0 = Alert; keenly responsive 1B: Ask month and age --> 0 = Both questions right 1C: 'Blink eyes' & 'squeeze hands' --> 0 = Performs both tasks 2: Horizontal extraocular movements --> 0 = Normal 3: Visual fields --> 0 = No visual loss 4: Facial palsy -->  0 = Normal symmetry 5A: Left arm motor drift --> 0 = No drift for 10 seconds 5B: Right arm motor drift --> 0 = No drift for 10 seconds 6A: Left leg motor drift --> 0 = No drift for 5 seconds 6B: Right leg motor drift --> 0 = No drift for 5 seconds 7: Limb Ataxia --> 0 = No ataxia 8: Sensation --> 1 = Mild-moderate loss: less sharp/more dull  9: Language/aphasia --> 0 = Normal; no aphasia 10: Dysarthria --> 0 = Normal 11: Extinction/inattention --> 0 = No abnormality   I have reviewed labs in epic and the results  pertinent to this consultation are: CBC:  Recent Labs  Lab 10/01/23 1456  WBC 9.6  NEUTROABS 7.5  HGB 15.2*  HCT 44.0  MCV 91.7  PLT 243    Basic Metabolic Panel:  Lab Results  Component Value Date   NA 132 (L) 10/01/2023   K 3.6 10/01/2023   CO2 22 10/01/2023   GLUCOSE 233 (H) 10/01/2023   BUN 15 10/01/2023   CREATININE 0.57 10/01/2023   CALCIUM 8.6 (L) 10/01/2023   GFRNONAA >60 10/01/2023   GFRAA 124 03/13/2021   Lipid Panel:  Lab Results  Component Value Date   LDLCALC UNABLE TO CALCULATE IF TRIGLYCERIDE OVER 400 mg/dL 57/84/6962   XBMW4X:  Lab Results  Component Value Date   HGBA1C 7.4 (H) 10/01/2023   Urine Drug Screen:     Component Value Date/Time   LABOPIA NONE DETECTED 10/01/2023 1706   COCAINSCRNUR NONE DETECTED 10/01/2023 1706   LABBENZ NONE DETECTED 10/01/2023 1706   AMPHETMU NONE DETECTED 10/01/2023 1706   THCU NONE DETECTED 10/01/2023 1706   LABBARB NONE DETECTED 10/01/2023 1706    Alcohol Level     Component Value Date/Time   ETH <10 10/01/2023 1456     I have reviewed the images obtained:  CT angio Head and Neck with contrast 10/01/2023: No emergent large vessel occlusion. The left PCA is small with suspected superimposed moderate to severe stenosis of the P2 PCA which is irregular and diminutive. Moderate bilateral paraclinoid ICA stenosis. Severe right vertebral artery origin stenosis.  MRI Brain wo contrast 10/01/2023: Acute infarct in the right midbrain. Moderate T2/FLAIR hyperintensities in the white matter, which are age advanced. These most likely represent accelerated chronic microvascular ischemic disease, but chronic demyelination could have a similar appearance.    ASSESSMENT/PLAN: 47 year old female with multiple medical comorbidities presented with right face and hand numbness as well as dizziness.  Acute ischemic stroke For etiology: Likely small vessel disease versus embolic  Recommendations: -TTE ordered and pending.   If negative, recommend 30-day cardiac monitor -Due to young age, will also obtain CT chest Abdo pelvis to look for occult malignancy as well as hypercoag workup.  -Recommend aspirin 81 mg daily and Plavix 75 mg daily for 3 weeks followed by monotherapy with aspirin 81 mg daily -Recommend atorvastatin 40 mg daily with goal LDL less than 70 -Goal blood pressure: Normotension -Counseled against nicotine use -Management of medical comorbidities -Discussed plan with patient and family at bedside -Discussed plan with Dr. Arbutus Leas via secure chat -Follow-up with neurology in 2 to 3 months (order placed)   Thank you for allowing [place] to participate in the care of this patient. If you have any further questions, please contact  me or neurohospitalist.   Lindie Spruce [facility]

## 2023-10-03 ENCOUNTER — Inpatient Hospital Stay (HOSPITAL_COMMUNITY): Payer: BC Managed Care – PPO

## 2023-10-03 DIAGNOSIS — Z720 Tobacco use: Secondary | ICD-10-CM | POA: Diagnosis not present

## 2023-10-03 DIAGNOSIS — I639 Cerebral infarction, unspecified: Secondary | ICD-10-CM | POA: Diagnosis not present

## 2023-10-03 DIAGNOSIS — I10 Essential (primary) hypertension: Secondary | ICD-10-CM | POA: Diagnosis not present

## 2023-10-03 LAB — GLUCOSE, CAPILLARY
Glucose-Capillary: 147 mg/dL — ABNORMAL HIGH (ref 70–99)
Glucose-Capillary: 205 mg/dL — ABNORMAL HIGH (ref 70–99)

## 2023-10-03 LAB — PROCALCITONIN: Procalcitonin: <0.1 ng/mL

## 2023-10-03 LAB — HOMOCYSTEINE: Homocysteine: 13.2 umol/L (ref 0.0–14.5)

## 2023-10-03 LAB — BETA-2-GLYCOPROTEIN I ABS, IGG/M/A
Beta-2 Glyco I IgG: <9 GPI IgG units (ref 0–20)
Beta-2-Glycoprotein I IgA: 10 GPI IgA units (ref 0–25)
Beta-2-Glycoprotein I IgM: 60 GPI IgM units — ABNORMAL HIGH (ref 0–32)

## 2023-10-03 LAB — SARS CORONAVIRUS 2 BY RT PCR: SARS Coronavirus 2 by RT PCR: NEGATIVE

## 2023-10-03 MED ORDER — INSULIN ASPART 100 UNIT/ML IJ SOLN
0.0000 [IU] | Freq: Three times a day (TID) | INTRAMUSCULAR | Status: DC
  Administered 2023-10-04: 5 [IU] via SUBCUTANEOUS
  Administered 2023-10-04: 2 [IU] via SUBCUTANEOUS
  Administered 2023-10-05: 3 [IU] via SUBCUTANEOUS
  Administered 2023-10-05: 5 [IU] via SUBCUTANEOUS
  Administered 2023-10-05 – 2023-10-06 (×2): 2 [IU] via SUBCUTANEOUS

## 2023-10-03 NOTE — Progress Notes (Signed)
Inpatient Rehab Coordinator Note:  I spoke with patient and her spouse over the phone to discuss CIR recommendations and goals/expectations of CIR stay.  We reviewed 3 hrs/day of therapy, physician follow up, and average length of stay 2 weeks (dependent upon progress) with goals of supervision to mod I.  They are open to CIR if she needs it.  Note testing still ongoing.  I reviewed need for insurance auth and they are okay with me starting that process today.  We also discussed that if she continues to improve and they feel she can d/c directly home before I hear back from insurance that is fine too.  I will start insurance today and will follow.   Estill Dooms, PT, DPT Admissions Coordinator [phone] 10/03/23  9:25 AM

## 2023-10-03 NOTE — Progress Notes (Signed)
Occupational Therapy Treatment Patient Details Name: Melissa Wilkerson MRN: [id number] DOB: 03-29-1976 Today's Date: 10/03/2023   History of present illness Melissa Wilkerson is a 47 y.o. female with medical history significant for HTN, tobacco abuse, prediabetes.  Patient presented to the ED with complaints of numbness to the right side of her face and right hand that started about 7 AM this morning.  Family also reports that was leaning to the right today when walking.  Spouse reports patient speech is slow, but no slurred or garbled.  Patient also reports onset of dizziness today.  Patient's son at bedside reports that about a week ago patient was complaining of some abnormal sensation and numbness to her right lower extremity.  No prior strokes.  She does not take any medication illicit or prescribed.  She smokes a pack of cigarettes daily.  She is not aware of a family history of stroke.  She reports a history of hypertension but she is not taking any medication for this.   OT comments  Pt agreeable to OT session. PT was sitting on edge of bed upon OT entry. She reports that she fed herself breakfast this morning, donned socks, and brushed teeth with supervision of partner. Completed UE ROM exercises and she demonstrates full ROM and UE strength WFL. She was able to complete coordination tasks such as finger to nose and tapping all fingers to thumb with eye open and closes with accuracy. Pt able to manipulate fasteners on gown and tie with independence. She continues to demonstrates impaired coordination when completing functional mobility and balance when completing ADLs in standing. She demonstrates slow labored movement with bed mobility, transfers,and requires CGA when ambulating. Pt would continue to benefit from OT in acute setting to address balance deficits. Pt continues to present as an appropriate candidate for CIR.       If plan is discharge home, recommend the following:  A little help with  walking and/or transfers;A little help with bathing/dressing/bathroom;Assistance with cooking/housework;Assist for transportation;Help with stairs or ramp for entrance   Equipment Recommendations  None recommended by OT       Precautions / Restrictions Precautions Precautions: Fall Restrictions Weight Bearing Restrictions: No       Mobility Bed Mobility Overal bed mobility: Modified Independent                  Transfers Overall transfer level: Needs assistance Equipment used: None Transfers: Sit to/from Stand Sit to Stand: Contact guard assist           General transfer comment: unasteady labored movement, required HHA at times to avoid falling     Balance Overall balance assessment: Needs assistance Sitting-balance support: Feet supported, No upper extremity supported Sitting balance-Leahy Scale: Good Sitting balance - Comments: seated at EOB   Standing balance support: During functional activity, No upper extremity supported Standing balance-Leahy Scale: Poor Standing balance comment: fair with CGA                           ADL either performed or assessed with clinical judgement   ADL   Eating/Feeding: Modified independent;Sitting                   Lower Body Dressing: Modified independent;Sitting/lateral leans                 General ADL Comments: Unsteady when in standing completin ADL's    Extremity/Trunk Assessment Upper Extremity Assessment Upper Extremity  Assessment: Overall WFL for tasks assessed   Lower Extremity Assessment Lower Extremity Assessment: Defer to PT evaluation        Vision   Vision Assessment?: No apparent visual deficits          Cognition Arousal: Alert Behavior During Therapy: WFL for tasks assessed/performed Overall Cognitive Status: Within Functional Limits for tasks assessed                                          Exercises Exercises: General Upper  Extremity General Exercises - Upper Extremity Shoulder Flexion: AROM Shoulder Extension: AROM Shoulder ABduction: AROM Shoulder ADduction: AROM Shoulder Horizontal ABduction: AROM Shoulder Horizontal ADduction: AROM Elbow Flexion: AROM Elbow Extension: AROM Wrist Flexion: AROM Wrist Extension: AROM Digit Composite Flexion: AROM Composite Extension: AROM            Pertinent Vitals/ Pain       Pain Assessment Pain Assessment: No/denies pain         Frequency  Min 2X/week        Progress Toward Goals  OT Goals(current goals can now be found in the care plan section)  Progress towards OT goals: Progressing toward goals  ADL Goals Pt Will Perform Grooming: with modified independence;standing Pt Will Perform Lower Body Dressing: with modified independence;sit to/from stand Pt Will Transfer to Toilet: with modified independence;ambulating Pt Will Perform Toileting - Clothing Manipulation and hygiene: Independently;sit to/from stand  Plan      Co-evaluation      Reason for Co-Treatment: To address functional/ADL transfers PT goals addressed during session: Mobility/safety with mobility;Balance;Proper use of DME;Strengthening/ROM OT goals addressed during session: ADL's and self-care         End of Session Equipment Utilized During Treatment: Gait belt  OT Visit Diagnosis: Unsteadiness on feet (R26.81);Other symptoms and signs involving the nervous system (R29.898)   Activity Tolerance Patient tolerated treatment well   Patient Left in bed;with call bell/phone within reach;with family/visitor present           Time: 5284-1324 OT Time Calculation (min): 25 min  Charges: OT General Charges $OT Visit: 1 Visit OT Treatments $Self Care/Home Management : 8-22 mins  Bevelyn Ngo, OTR/L 10/03/2023, 8:58 AM

## 2023-10-03 NOTE — Progress Notes (Addendum)
PROGRESS NOTE  Melissa Wilkerson UJW:[id number] DOB: [birth year] DOA: 10/01/2023 PCP: Pcp, No  Brief History:  46 y/o female with a history of hypertension, tobacco abuse, and diabetes presenting with numbness on her right face and right hand up to her elbow that began at 7 AM on 10/01/2023.  The patient states that she had some numbness in her right foot up to her right knee about a week prior to this admission lasting " a few minutes".  She states that on and off for the past week she has noted that she has been " leaning to the right" with ambulation.  However she denies any frank leg weakness or falling.  She has had some dizziness but denies any visual disturbance, syncope, chest pain, shortness of breath.  She has not had any fevers, chills, nausea, vomiting or diarrhea, abdominal pain.  Spouse reported that the patient speech has been slow, but there has been no dysarthria.  Because of her right-sided numbness and dizziness, the patient presented for further evaluation and treatment. In the ED, the patient was afebrile and hemodynamically stable with oxygen saturation 99% room air.  WBC 9.6, hemoglobin 15.2, platelets 243.  Sodium 132, potassium 3.6, bicarbonate 22, serum creatinine 0.57.  LFTs unremarkable.  UA was negative for pyuria.  Urine drug screen was negative.  MRI brain showed an acute infarct in the right midbrain.  CTA head and neck was negative for LVO.  Showed moderate to severe stenosis of the P2 PCA.  The patient was started on aspirin and Plavix.  She was admitted for further evaluation and treatment of her stroke. Neurology was consulted and recommended DAPT x 3 weeks followed by ASA 81 mg  daily.  CT CAP was ordered by neurology with results discussed below.  PT recommended CIR   Assessment/Plan:  Acute ischemic stroke -Appreciate Neurology Consult>>hypercoagulable work up ordered as well as CT CAP to look for occult malignancy -PT/OT evaluation>>CIR -Speech therapy  eval -MRI brain--acute infarct right midbrain -CTA H&N--negative LVO.  Moderate severe stenosis P2 PCA, moderate bilateral paraclinoid ICA stenosis.  Severe right vertebral artery stenosis -Echo--EF 65-70%, not optimal to eval WMA, no PFO, trivial MR -LDL--unable to calculate secondary to elevated triglycerides -HbA1C--7.4 -Antiplatelet--ASA 81 mg and plavix 75 mg daily   Essential hypertension -not on any medications as an outpatient -Allowing for permissive hypertension temporarily -Plan to start antihypertensive medications at time of discharge>>start amlodipine   Mixed hyperlipidemia -Unable to calculate LDL secondary to elevated triglycerides -Triglycerides 481 -Total cholesterol 241 -direct LDL 164 -start lipitor 40 mg daily   Impaired glucose tolerance>>new diagnosis DM2 with hyperglycemia -Hemoglobin A1c 7.4 -03/03/2021 hemoglobin A1c 6.2 -start novolog sliding scale -start metformin after d/c   Tobacco abuse -Tobacco cessation discussed   Left lung nodules/R-lung nodule -noted on CT chest -will need outpatient surveillance -pt and spouse updated   Left ovarian cystic lesion -pelvic ultrasound -outpatient GYN follow up--I've set up with Dr. Despina Hidden on 10/22/13 at 11AM   Left adrenal nodule -outpatient MR      Family Communication:   spouse at bedside 11/15  Consultants:  CIR, neurology  Code Status:  FULL   DVT Prophylaxis: [place] Lovenox   Procedures: As Listed in Progress Note Above  Antibiotics: None   Total time spent 50 minutes.  Greater than 50% spent face to face counseling and coordinating care.    Subjective: Patient denies fevers, chills, headache, chest pain, dyspnea, nausea, vomiting,  diarrhea, abdominal pain, dysuria, hematuria, hematochezia, and melena.   Objective: Vitals:   10/02/23 2000 10/03/23 0000 10/03/23 0400 10/03/23 0859  BP: (!) 160/69 (!) 178/82 (!) 168/75 139/68  Pulse: 73 71 70 97  Resp:  16 12   Temp: 98.1 F (36.7  C) 98.3 F (36.8 C) 98.3 F (36.8 C)   TempSrc: Oral Oral Oral   SpO2: 98% 99% 97% 97%  Weight:        Intake/Output Summary (Last 24 hours) at 10/03/2023 1242 Last data filed at 10/03/2023 1231 Gross per 24 hour  Intake 780 ml  Output 1500 ml  Net -720 ml   Weight change:  Exam:  General:  Pt is alert, follows commands appropriately, not in acute distress HEENT: No icterus, No thrush, No neck mass, [place]/AT Cardiovascular: RRR, S1/S2, no rubs, no gallops Respiratory: bibasilar rales.  No wheeze Abdomen: Soft/+BS, non tender, non distended, no guarding Extremities: No edema, No lymphangitis, No petechiae, No rashes, no synovitis Neuro:  CN II-XII intact, strength 4/5 in RUE, RLE, strength 4/5 LUE, LLE; sensation intact bilateral; no dysmetria; babinski equivocal    Data Reviewed: I have personally reviewed following labs and imaging studies Basic Metabolic Panel: Recent Labs  Lab 10/01/23 1456  NA 132*  K 3.6  CL 100  CO2 22  GLUCOSE 233*  BUN 15  CREATININE 0.57  CALCIUM 8.6*  MG 1.9   Liver Function Tests: Recent Labs  Lab 10/01/23 1456  AST 23  ALT 31  ALKPHOS 82  BILITOT 0.5  PROT 6.9  ALBUMIN 3.8   No results for input(s): "LIPASE", "AMYLASE" in the last 168 hours. No results for input(s): "AMMONIA" in the last 168 hours. Coagulation Profile: Recent Labs  Lab 10/01/23 1456  INR 1.0   CBC: Recent Labs  Lab 10/01/23 1456  WBC 9.6  NEUTROABS 7.5  HGB 15.2*  HCT 44.0  MCV 91.7  PLT 243   Cardiac Enzymes: No results for input(s): "CKTOTAL", "CKMB", "CKMBINDEX", "TROPONINI" in the last 168 hours. BNP: Invalid input(s): "POCBNP" CBG: Recent Labs  Lab 10/01/23 1415  GLUCAP 234*   HbA1C: Recent Labs    10/01/23 1456  HGBA1C 7.4*   Urine analysis:    Component Value Date/Time   COLORURINE YELLOW 10/01/2023 1706   APPEARANCEUR CLEAR 10/01/2023 1706   LABSPEC 1.040 (H) 10/01/2023 1706   PHURINE 7.0 10/01/2023 1706   GLUCOSEU  >=500 (A) 10/01/2023 1706   HGBUR NEGATIVE 10/01/2023 1706   BILIRUBINUR NEGATIVE 10/01/2023 1706   KETONESUR NEGATIVE 10/01/2023 1706   PROTEINUR 30 (A) 10/01/2023 1706   NITRITE NEGATIVE 10/01/2023 1706   LEUKOCYTESUR NEGATIVE 10/01/2023 1706   Sepsis Labs: @LABRCNTIP (procalcitonin:4,lacticidven:4) ) Recent Results (from the past 240 hour(s))  SARS Coronavirus 2 by RT PCR (hospital order, performed in [facility] hospital lab) *cepheid single result test* Anterior Nasal Swab     Status: None   Collection Time: 10/03/23  7:32 AM   Specimen: Anterior Nasal Swab  Result Value Ref Range Status   SARS Coronavirus 2 by RT PCR NEGATIVE NEGATIVE Final    Comment: (NOTE) SARS-CoV-2 target nucleic acids are NOT DETECTED.  The SARS-CoV-2 RNA is generally detectable in upper and lower respiratory specimens during the acute phase of infection. The lowest concentration of SARS-CoV-2 viral copies this assay can detect is 250 copies / mL. A negative result does not preclude SARS-CoV-2 infection and should not be used as the sole basis for treatment or other patient management decisions.  A  negative result may occur with improper specimen collection / handling, submission of specimen other than nasopharyngeal swab, presence of viral mutation(s) within the areas targeted by this assay, and inadequate number of viral copies (<250 copies / mL). A negative result must be combined with clinical observations, patient history, and epidemiological information.  Fact Sheet for Patients:   RoadLapTop.co.za  Fact Sheet for Healthcare Providers: http://kim-miller.com/  This test is not yet approved or  cleared by the [place] FDA and has been authorized for detection and/or diagnosis of SARS-CoV-2 by FDA under an Emergency Use Authorization (EUA).  This EUA will remain in effect (meaning this test can be used) for the duration of the COVID-19  declaration under Section 564(b)(1) of the Act, 21 U.S.C. section 360bbb-3(b)(1), unless the authorization is terminated or revoked sooner.  Performed at [facility], [address], [place], [place] [address]      Scheduled Meds:  aspirin EC  81 mg Oral Daily   atorvastatin  40 mg Oral Daily   clopidogrel  75 mg Oral Daily   enoxaparin (LOVENOX) injection  40 mg Subcutaneous Q24H   Continuous Infusions:  Procedures/Studies: CT CHEST ABDOMEN PELVIS W CONTRAST  Result Date: 10/02/2023 CLINICAL DATA:  Inpatient encounter with suspected occult malignancy. 47 year old female. Menopausal status is not known. EXAM: CT CHEST, ABDOMEN, AND PELVIS WITH CONTRAST TECHNIQUE: Multidetector CT imaging of the chest, abdomen and pelvis was performed following the standard protocol during bolus administration of intravenous contrast. RADIATION DOSE REDUCTION: This exam was performed according to the departmental dose-optimization program which includes automated exposure control, adjustment of the mA and/or kV according to patient size and/or use of iterative reconstruction technique. CONTRAST:  OMNIPAQUE IOHEXOL 300 MG/ML SOLN, 30mL OMNIPAQUE IOHEXOL 300 MG/ML SOLN COMPARISON:  No relevant prior study. No prior body CT for comparison. FINDINGS: CT CHEST FINDINGS Cardiovascular: There is mild cardiomegaly. There is no pericardial effusion. There are calcific plaques in the distal aortic arch and aortic annulus, but no visible coronary calcifications. The great vessels are patent. There is no aortic or great vessel stenosis, dissection or aneurysm. There are nonstenosing soft plaques at the origins of the brachiocephalic and left common carotid arteries. Pulmonary veins and arteries are normal caliber. The pulmonary arteries are centrally clear. Mediastinum/Nodes: No thyroid nodule is seen. There are several bilateral borderline sized axillary space lymph nodes, largest on the right is 9 mm in short axis.  Largest on the left is 8 mm in short axis. There are 2 minimally prominent left hilar lymph nodes each measuring 10 mm in short axis. No other intrathoracic adenopathy is seen. There are shotty subcentimeter scattered mediastinal nodes which are not pathologic by size criteria. The thoracic trachea, main bronchi and thoracic esophagus unremarkable. Lungs/Pleura: No pleural effusion, thickening or pneumothorax. There is asymmetric posterior subpleural atelectasis in the left lower lobe. Faint hazy left lower lobe infrahilar opacities are noted and could be due to mosaic changes from air trapping and small airway disease or could indicate a minimal pneumonitis. Also could be ground-glass scarring. Three-month follow-up chest CT recommended to ensure clearing or stability. There are mild features of paraseptal emphysema in the upper lobes. Mild central bronchial thickening bilaterally. There is a 5 mm left upper lobe nodule on 3:62, a 3 mm left upper lobe nodule on 3:52, and a 3 mm right middle lobe nodule on 3:81. The lungs are otherwise clear. Musculoskeletal: There is degenerative disc disease and spondylosis with slight dextroscoliosis of the thoracic spine.  No regional suspicious bone lesion is seen. No chest wall mass is evident. CT ABDOMEN PELVIS FINDINGS Hepatobiliary: Enlarged, mildly steatotic, 23 cm length. No mass enhancement. The hepatic portal main vein is normal caliber. Unremarkable gallbladder and bile ducts. Pancreas: No abnormality. Spleen: Enlarged measuring 16 cm in length.  No mass. Adrenals/Urinary Tract: There is no right adrenal mass. In the medial limb of left adrenal gland, there is a 1.3 cm nodule, Hounsfield density ranges from 50-63. Chemical shift MRI recommended. There is a 1 cm hypodense lesion in the outer midpole left kidney, Hounsfield density is 37. Indeterminate due to density. Attention on MRI recommended. The bilateral kidneys are otherwise unremarkable with symmetric excretion  in the delayed phase. There are no urinary stone or obstruction. Bladder is unremarkable for the degree of distention. Stomach/Bowel: No dilatation or wall thickening including of the retrocecal appendix. There is sigmoid diverticulosis without evidence of focal colitis or diverticulitis. Vascular/Lymphatic: Somewhat age-advanced abdominal aortoiliac atherosclerosis. No enlarged abdominal or pelvic lymph nodes. Reproductive: 2.8 cm left ovarian cystic lesion, Hounsfield density is 21 with somewhat irregular appearance to the wall. Pelvic ultrasound recommended. The uterus and adnexal structures are otherwise unremarkable. Other: No free fluid, free air or incarcerated hernia. Musculoskeletal: No acute or significant osseous findings. IMPRESSION: 1. 2.8 cm left ovarian cystic lesion with somewhat irregular appearance to the wall. Pelvic ultrasound recommended. 2. 1.3 cm left adrenal indeterminate nodule. Chemical shift MRI recommended. 3. 1 cm indeterminate hypodense lesion in the outer midpole left kidney. Attention on MRI recommended. 4. Mild cardiomegaly. 5. Aortoiliac atherosclerosis. 6. Emphysema and bronchitis. 7. Faint hazy left lower lobe infrahilar opacities which could be due to mosaicism from air trapping and small airway disease, pneumonitis or ground-glass scarring. Three-month follow-up chest CT recommended to ensure clearing or stability. 8. 5 mm and two 3 mm pulmonary nodules. Attention on follow-up recommended. 9. Hepatosplenomegaly with mild hepatic steatosis. 10. Diverticulosis without evidence of diverticulitis. Aortic Atherosclerosis (ICD10-I70.0) and Emphysema (ICD10-J43.9). Electronically Signed   By: Almira Bar M.D.   On: 10/02/2023 21:59   ECHOCARDIOGRAM COMPLETE  Result Date: 10/02/2023    ECHOCARDIOGRAM REPORT   Patient Name:   SHYLA MANNINEN Date of Exam: 10/02/2023 Medical Rec #:  [id number]      Height:       61.0 in Accession #:    [id number]     Weight:       175.0 lb Date of  Birth:  11-Feb-1976      BSA:          1.785 m Patient Age:    47 years       BP:           146/72 mmHg Patient Gender: F              HR:           68 bpm. [facility] Procedure: 2D Echo, 3D Echo, Cardiac Doppler, Color Doppler and Strain Analysis Indications:   Stroke  History:       Patient has no prior history of Echocardiogram examinations.                Stroke.  Sonographer:   Karma Ganja Referring      [id number]nie Boer Phys:  Sonographer Comments: Global longitudinal strain was attempted. IMPRESSIONS  1. Left ventricular ejection fraction, by estimation, is 65 to 70%. The left ventricle has normal function. Left ventricular endocardial border not optimally defined to evaluate  regional wall motion. Left ventricular diastolic parameters were normal.  2. Right ventricular systolic function is normal. The right ventricular size is normal. Tricuspid regurgitation signal is inadequate for assessing PA pressure.  3. The mitral valve is grossly normal. Trivial mitral valve regurgitation.  4. The aortic valve was not well visualized. Aortic valve regurgitation is not visualized. Aortic valve mean gradient measures 6.0 mmHg.  5. The inferior vena cava is normal in size with <50% respiratory variability, suggesting right atrial pressure of 8 mmHg. Comparison(s): No prior Echocardiogram. FINDINGS  Left Ventricle: Left ventricular ejection fraction, by estimation, is 65 to 70%. The left ventricle has normal function. Left ventricular endocardial border not optimally defined to evaluate regional wall motion. Global longitudinal strain performed but  not reported based on interpreter judgement due to suboptimal tracking. The left ventricular internal cavity size was normal in size. There is no left ventricular hypertrophy. Left ventricular diastolic parameters were normal. Right Ventricle: The right ventricular size is normal. No increase in right ventricular wall thickness. Right ventricular  systolic function is normal. Tricuspid regurgitation signal is inadequate for assessing PA pressure. Left Atrium: Left atrial size was normal in size. Right Atrium: Right atrial size was normal in size. Pericardium: There is no evidence of pericardial effusion. Presence of epicardial fat layer. Mitral Valve: The mitral valve is grossly normal. There is mild calcification of the mitral valve leaflet(s). Mild mitral annular calcification. Trivial mitral valve regurgitation. Tricuspid Valve: The tricuspid valve is grossly normal. Tricuspid valve regurgitation is trivial. Aortic Valve: The aortic valve was not well visualized. Aortic valve regurgitation is not visualized. Aortic valve mean gradient measures 6.0 mmHg. Aortic valve peak gradient measures 10.8 mmHg. Aortic valve area, by VTI measures 1.95 cm. Pulmonic Valve: The pulmonic valve was not well visualized. Pulmonic valve regurgitation is not visualized. Aorta: The aortic root is normal in size and structure. Venous: The inferior vena cava is normal in size with less than 50% respiratory variability, suggesting right atrial pressure of 8 mmHg. IAS/Shunts: No atrial level shunt detected by color flow Doppler.  LEFT VENTRICLE PLAX 2D LVIDd:         5.50 cm   Diastology LVIDs:         3.50 cm   LV e' medial:    8.92 cm/s LV PW:         1.00 cm   LV E/e' medial:  14.1 LV IVS:        0.90 cm   LV e' lateral:   9.57 cm/s LVOT diam:     2.00 cm   LV E/e' lateral: 13.2 LV SV:         73 LV SV Index:   41 LVOT Area:     3.14 cm                           3D Volume EF:                          3D EF:        58 %                          LV EDV:       143 ml                          LV ESV:  60 ml                          LV SV:        83 ml RIGHT VENTRICLE             IVC RV Basal diam:  2.60 cm     IVC diam: 1.80 cm RV S prime:     14.10 cm/s TAPSE (M-mode): 3.3 cm LEFT ATRIUM             Index        RIGHT ATRIUM           Index LA diam:        3.20 cm 1.79 cm/m    RA Area:     14.30 cm LA Vol (A2C):   91.1 ml 51.05 ml/m  RA Volume:   33.80 ml  18.94 ml/m LA Vol (A4C):   50.1 ml 28.07 ml/m LA Biplane Vol: 70.0 ml 39.23 ml/m  AORTIC VALVE AV Area (Vmax):    1.99 cm AV Area (Vmean):   1.94 cm AV Area (VTI):     1.95 cm AV Vmax:           164.00 cm/s AV Vmean:          110.000 cm/s AV VTI:            0.374 m AV Peak Grad:      10.8 mmHg AV Mean Grad:      6.0 mmHg LVOT Vmax:         104.00 cm/s LVOT Vmean:        68.100 cm/s LVOT VTI:          0.232 m LVOT/AV VTI ratio: 0.62  AORTA Ao Root diam: 2.70 cm MITRAL VALVE MV Area (PHT): 3.50 cm     SHUNTS MV Decel Time: 217 msec     Systemic VTI:  0.23 m MV E velocity: 126.00 cm/s  Systemic Diam: 2.00 cm MV A velocity: 119.00 cm/s MV E/A ratio:  1.06 Nona Dell MD Electronically signed by Nona Dell MD Signature Date/Time: 10/02/2023/10:22:17 AM    Final    MR BRAIN WO CONTRAST  Addendum Date: 10/01/2023   ADDENDUM REPORT: 10/01/2023 21:00 ADDENDUM: Correction: The infarct is in the medulla, not the midbrain. Electronically Signed   By: Feliberto Harts M.D.   On: 10/01/2023 21:00   Result Date: 10/01/2023 CLINICAL DATA:  Neuro deficit, acute, stroke suspected EXAM: MRI HEAD WITHOUT CONTRAST TECHNIQUE: Multiplanar, multiecho pulse sequences of the brain and surrounding structures were obtained without intravenous contrast. COMPARISON:  CTA head/neck from today. FINDINGS: Brain: Acute infarct in the right midbrain. Slight edema without mass effect. Age advanced T2/FLAIR hyperintensities in the white matter. No acute hemorrhage, mass lesion, midline shift or hydrocephalus. Vascular: Major arterial flow voids are maintained skull base. Skull and upper cervical spine: Normal marrow signal. Sinuses/Orbits: Negative. IMPRESSION: 1. Acute infarct in the right midbrain. 2. Moderate T2/FLAIR hyperintensities in the white matter, which are age advanced. These most likely represent accelerated chronic microvascular  ischemic disease, but chronic demyelination could have a similar appearance. Electronically Signed: By: Feliberto Harts M.D. On: 10/01/2023 17:53   CT ANGIO HEAD NECK W WO CM  Result Date: 10/01/2023 CLINICAL DATA:  Neuro deficit, acute, stroke suspected EXAM: CT ANGIOGRAPHY HEAD AND NECK WITH AND WITHOUT CONTRAST TECHNIQUE: Multidetector CT imaging of the head and neck was performed using the standard protocol during bolus  administration of intravenous contrast. Multiplanar CT image reconstructions and MIPs were obtained to evaluate the vascular anatomy. Carotid stenosis measurements (when applicable) are obtained utilizing NASCET criteria, using the distal internal carotid diameter as the denominator. RADIATION DOSE REDUCTION: This exam was performed according to the departmental dose-optimization program which includes automated exposure control, adjustment of the mA and/or kV according to patient size and/or use of iterative reconstruction technique. CONTRAST:  75mL OMNIPAQUE IOHEXOL 350 MG/ML SOLN COMPARISON:  None Available. FINDINGS: CT HEAD FINDINGS Brain: No evidence of acute infarction, hemorrhage, hydrocephalus, extra-axial collection or mass lesion/mass effect. Vascular: See below. Skull: No acute fracture. Sinuses/Orbits: No acute finding. Other: No mastoid effusions. Review of the MIP images confirms the above findings CTA NECK FINDINGS Aortic arch: Great vessel origins are patent without significant stenosis. Right carotid system: No evidence of dissection, stenosis (50% or greater), or occlusion. Left carotid system: No evidence of dissection, stenosis (50% or greater), or occlusion. Vertebral arteries: Severe right vertebral artery origin stenosis. Left vertebral artery is patent. Skeleton: No acute fracture. Other neck: No acute abnormality on limited assessment. Upper chest: Visualized lung apices are clear. Review of the MIP images confirms the above findings CTA HEAD FINDINGS Anterior  circulation: Bilateral intracranial ICAs, MCAs and ACAs are patent. Moderate bilateral paraclinoid ICA stenosis Posterior circulation: Bilateral intradural vertebral arteries, basilar artery and bilateral posterior cerebral arteries are patent proximally. The left PCA is small with suspected superimposed moderate stenosis of the P2 PCA which is irregular Venous sinuses: As permitted by contrast timing, patent. Review of the MIP images confirms the above findings IMPRESSION: 1. No emergent large vessel occlusion. 2. The left PCA is small with suspected superimposed moderate to severe stenosis of the P2 PCA which is irregular and diminutive. 3. Moderate bilateral paraclinoid ICA stenosis. 4. Severe right vertebral artery origin stenosis. Electronically Signed   By: Feliberto Harts M.D.   On: 10/01/2023 17:27    Catarina Hartshorn, DO  [facility]  If 7PM-7AM, please contact night-coverage [email or web address] Password [email or web address] 10/03/2023, 12:42 PM   LOS: 2 days

## 2023-10-03 NOTE — Progress Notes (Signed)
Inpatient Rehab Admissions Coordinator:   I did receive insurance approval for CIR.  I cannot arrange transport over the weekend, but I can plan for potential admit Monday as long as she remains stable.   Estill Dooms, PT, DPT Admissions Coordinator [phone] 10/03/23  4:34 PM

## 2023-10-03 NOTE — Progress Notes (Signed)
Physical Therapy Treatment Patient Details Name: Melissa Wilkerson MRN: [id number] DOB: 22-Apr-1976 Today's Date: 10/03/2023   History of Present Illness Melissa Wilkerson is a 47 y.o. female with medical history significant for HTN, tobacco abuse, prediabetes.  Patient presented to the ED with complaints of numbness to the right side of her face and right hand that started about 7 AM this morning.  Family also reports that was leaning to the right today when walking.  Spouse reports patient speech is slow, but no slurred or garbled.  Patient also reports onset of dizziness today.  Patient's son at bedside reports that about a week ago patient was complaining of some abnormal sensation and numbness to her right lower extremity.  No prior strokes.  She does not take any medication illicit or prescribed.  She smokes a pack of cigarettes daily.  She is not aware of a family history of stroke.  She reports a history of hypertension but she is not taking any medication for this.    PT Comments  Patient demonstrates good return for sit to supine and supine to sitting without requiring use of bed rail, very unsteady on feet when walking without AD with frequent staggering to the right requiring assistance to prevent falling and has to use RW once fatigued due to fall risk.  Patient tolerated sitting up at bedside after therapy with her spouse present.  Patient will benefit from continued skilled physical therapy in hospital and recommended venue below to increase strength, balance, endurance for safe ADLs and gait.      If plan is discharge home, recommend the following: A lot of help with walking and/or transfers;A little help with bathing/dressing/bathroom;Help with stairs or ramp for entrance;Assistance with cooking/housework   Can travel by private Scientist, research (medical) walker (2 wheels)    Recommendations for Other Services       Precautions / Restrictions  Precautions Precautions: Fall Restrictions Weight Bearing Restrictions: No     Mobility  Bed Mobility Overal bed mobility: Modified Independent             General bed mobility comments: good return for sit to supine, supine to sitting    Transfers Overall transfer level: Needs assistance Equipment used: None, Rolling walker (2 wheels) Transfers: Sit to/from Stand, Bed to chair/wheelchair/BSC Sit to Stand: Contact guard assist   Step pivot transfers: Contact guard assist, Min assist       General transfer comment: wide base of suppor having to lean on nearby objects for support when not using an AD    Ambulation/Gait Ambulation/Gait assistance: Min assist, Mod assist Gait Distance (Feet): 45 Feet Assistive device: None, Rolling walker (2 wheels) Gait Pattern/deviations: Decreased step length - right, Decreased step length - left, Decreased stride length, Antalgic, Ataxic, Wide base of support, Staggering right Gait velocity: decreased     General Gait Details: slow labored cadence without AD, frequent  staggering to the right, arms extended to sides for mainting balance requiring verbal cues to hold arms at sides and let swing with fair/porr carryover, once fatigued had to use RW for safety   Stairs             Wheelchair Mobility     Tilt Bed    Modified Rankin (Stroke Patients Only)       Balance Overall balance assessment: Needs assistance Sitting-balance support: Feet supported, No upper extremity supported Sitting balance-Leahy Scale: Good Sitting balance - Comments: seated  at EOB   Standing balance support: During functional activity, No upper extremity supported Standing balance-Leahy Scale: Poor Standing balance comment: fair/poor without AD, fair/good using RW                            Cognition Arousal: Alert Behavior During Therapy: WFL for tasks assessed/performed Overall Cognitive Status: Within Functional Limits for  tasks assessed                                          Exercises General Exercises - Lower Extremity Long Arc Quad: Seated, AROM, Strengthening, Both, 10 reps Hip Flexion/Marching: Seated, AROM, Strengthening, Both, 10 reps Toe Raises: Seated, AROM, Strengthening, Both, 10 reps Heel Raises: Seated, AROM, Strengthening, Both, 10 reps    General Comments        Pertinent Vitals/Pain Pain Assessment Pain Assessment: No/denies pain    Home Living                          Prior Function            PT Goals (current goals can now be found in the care plan section) Acute Rehab PT Goals Patient Stated Goal: return home with family to assist PT Goal Formulation: With patient/family Time For Goal Achievement: 10/16/23 Potential to Achieve Goals: Good Progress towards PT goals: Progressing toward goals    Frequency    Min 4X/week      PT Plan      Co-evaluation PT/OT/SLP Co-Evaluation/Treatment: Yes Reason for Co-Treatment: To address functional/ADL transfers PT goals addressed during session: Mobility/safety with mobility;Balance;Proper use of DME;Strengthening/ROM        AM-PAC PT "6 Clicks" Mobility   Outcome Measure  Help needed turning from your back to your side while in a flat bed without using bedrails?: None Help needed moving from lying on your back to sitting on the side of a flat bed without using bedrails?: None Help needed moving to and from a bed to a chair (including a wheelchair)?: A Little Help needed standing up from a chair using your arms (e.g., wheelchair or bedside chair)?: A Little Help needed to walk in hospital room?: A Lot Help needed climbing 3-5 steps with a railing? : A Lot 6 Click Score: 18    End of Session Equipment Utilized During Treatment: Gait belt Activity Tolerance: Patient tolerated treatment well;Patient limited by fatigue Patient left: in bed;with call bell/phone within reach;with  family/visitor present Nurse Communication: Mobility status PT Visit Diagnosis: Unsteadiness on feet (R26.81);Other abnormalities of gait and mobility (R26.89);Muscle weakness (generalized) (M62.81)     Time: 1610-9604 PT Time Calculation (min) (ACUTE ONLY): 25 min  Charges:    $Gait Training: 8-22 mins $Therapeutic Exercise: 8-22 mins PT General Charges $$ ACUTE PT VISIT: 1 Visit                     9:01 AM, 10/03/23 Ocie Bob, MPT Physical Therapist with [facility] [phone] office [phone] mobile phone

## 2023-10-03 NOTE — Progress Notes (Signed)
Inpatient Rehab Admissions Coordinator:   Attempted to speak to patient regarding CIR recs but off unit for testing.  I will try again later this morning.   Estill Dooms, PT, DPT Admissions Coordinator [phone] 10/03/23  8:53 AM

## 2023-10-03 NOTE — Discharge Summary (Incomplete)
Physician Discharge Summary   Patient: Melissa Wilkerson MRN: [id number] DOB: 02-18-1976  Admit date:     10/01/2023  Discharge date: 10/03/23  Discharge Physician: Onalee Hua Ruthene Methvin   PCP: Pcp, No   Recommendations at discharge:   Please follow up with primary care provider within 1-2 weeks  Please repeat BMP and CBC in one week     Hospital Course: 47 y/o female with a history of hypertension, tobacco abuse, and diabetes presenting with numbness on her right face and right hand up to her elbow that began at 7 AM on 10/01/2023.  The patient states that she had some numbness in her right foot up to her right knee about a week prior to this admission lasting " a few minutes".  She states that on and off for the past week she has noted that she has been " leaning to the right" with ambulation.  However she denies any frank leg weakness or falling.  She has had some dizziness but denies any visual disturbance, syncope, chest pain, shortness of breath.  She has not had any fevers, chills, nausea, vomiting or diarrhea, abdominal pain.  Spouse reported that the patient speech has been slow, but there has been no dysarthria.  Because of her right-sided numbness and dizziness, the patient presented for further evaluation and treatment. In the ED, the patient was afebrile and hemodynamically stable with oxygen saturation 99% room air.  WBC 9.6, hemoglobin 15.2, platelets 243.  Sodium 132, potassium 3.6, bicarbonate 22, serum creatinine 0.57.  LFTs unremarkable.  UA was negative for pyuria.  Urine drug screen was negative.  MRI brain showed an acute infarct in the right midbrain.  CTA head and neck was negative for LVO.  Showed moderate to severe stenosis of the P2 PCA.  The patient was started on aspirin and Plavix.  She was admitted for further evaluation and treatment of her stroke. Neurology was consulted and recommended DAPT x 3 weeks followed by ASA 81 mg  daily.  CT CAP was ordered by neurology with results  discussed below.  PT recommended CIR  Assessment and Plan: Acute ischemic stroke -Appreciate Neurology Consult>>hypercoagulable work up ordered as well as CT CAP to look for occult malignancy -PT/OT evaluation>>CIR -Speech therapy eval -MRI brain--acute infarct right midbrain -CTA H&N--negative LVO.  Moderate severe stenosis P2 PCA, moderate bilateral paraclinoid ICA stenosis.  Severe right vertebral artery stenosis -Echo--EF 65-70%, not optimal to eval WMA, no PFO, trivial MR -LDL--unable to calculate secondary to elevated triglycerides -HbA1C--7.4 -Antiplatelet--ASA 81 mg and plavix 75 mg daily   Essential hypertension -not on any medications as an outpatient -Allowing for permissive hypertension temporarily -Plan to start antihypertensive medications at time of discharge>>start amlodipine   Mixed hyperlipidemia -Unable to calculate LDL secondary to elevated triglycerides -Triglycerides 481 -Total cholesterol 241 -direct LEL 164 -start lipitor 40 mg daily   Impaired glucose tolerance>>new diagnosis DM2 -Hemoglobin A1c 7.4 -03/03/2021 hemoglobin A1c 6.2 -start metformin   Tobacco abuse -Tobacco cessation discussed  Left lung nodules/R-lung nodule -noted on CT chest -will need outpatient surveillance -pt and spouse updated  Left ovarian cystic lesion -pelvic ultrasound -outpatient GYN follow up  Left adrenal nodule -outpatient MR     {(NOTE) Pain control PDMP Statment (Optional):26782} Consultants: neurology Procedures performed: none  Disposition: {Plan; Disposition:26390} Diet recommendation:  Cardiac and Carb modified diet DISCHARGE MEDICATION: Allergies as of 10/03/2023   No Known Allergies   Med Rec must be completed prior to using this SMARTLINK***  Discharge Exam: Filed Weights   10/01/23 1420  Weight: 79.4 kg   HEENT:  [place]/AT, No thrush, no icterus CV:  RRR, no rub, no S3, no S4 Lung:  bibasilar rales. No wheeze Abd:  soft/+BS,  NT Ext:  No edema, no lymphangitis, no synovitis, no rash   Condition at discharge: stable  The results of significant diagnostics from this hospitalization (including imaging, microbiology, ancillary and laboratory) are listed below for reference.   Imaging Studies: CT CHEST ABDOMEN PELVIS W CONTRAST  Result Date: 10/02/2023 CLINICAL DATA:  Inpatient encounter with suspected occult malignancy. 47 year old female. Menopausal status is not known. EXAM: CT CHEST, ABDOMEN, AND PELVIS WITH CONTRAST TECHNIQUE: Multidetector CT imaging of the chest, abdomen and pelvis was performed following the standard protocol during bolus administration of intravenous contrast. RADIATION DOSE REDUCTION: This exam was performed according to the departmental dose-optimization program which includes automated exposure control, adjustment of the mA and/or kV according to patient size and/or use of iterative reconstruction technique. CONTRAST:  OMNIPAQUE IOHEXOL 300 MG/ML SOLN, 30mL OMNIPAQUE IOHEXOL 300 MG/ML SOLN COMPARISON:  No relevant prior study. No prior body CT for comparison. FINDINGS: CT CHEST FINDINGS Cardiovascular: There is mild cardiomegaly. There is no pericardial effusion. There are calcific plaques in the distal aortic arch and aortic annulus, but no visible coronary calcifications. The great vessels are patent. There is no aortic or great vessel stenosis, dissection or aneurysm. There are nonstenosing soft plaques at the origins of the brachiocephalic and left common carotid arteries. Pulmonary veins and arteries are normal caliber. The pulmonary arteries are centrally clear. Mediastinum/Nodes: No thyroid nodule is seen. There are several bilateral borderline sized axillary space lymph nodes, largest on the right is 9 mm in short axis. Largest on the left is 8 mm in short axis. There are 2 minimally prominent left hilar lymph nodes each measuring 10 mm in short axis. No other intrathoracic adenopathy is  seen. There are shotty subcentimeter scattered mediastinal nodes which are not pathologic by size criteria. The thoracic trachea, main bronchi and thoracic esophagus unremarkable. Lungs/Pleura: No pleural effusion, thickening or pneumothorax. There is asymmetric posterior subpleural atelectasis in the left lower lobe. Faint hazy left lower lobe infrahilar opacities are noted and could be due to mosaic changes from air trapping and small airway disease or could indicate a minimal pneumonitis. Also could be ground-glass scarring. Three-month follow-up chest CT recommended to ensure clearing or stability. There are mild features of paraseptal emphysema in the upper lobes. Mild central bronchial thickening bilaterally. There is a 5 mm left upper lobe nodule on 3:62, a 3 mm left upper lobe nodule on 3:52, and a 3 mm right middle lobe nodule on 3:81. The lungs are otherwise clear. Musculoskeletal: There is degenerative disc disease and spondylosis with slight dextroscoliosis of the thoracic spine. No regional suspicious bone lesion is seen. No chest wall mass is evident. CT ABDOMEN PELVIS FINDINGS Hepatobiliary: Enlarged, mildly steatotic, 23 cm length. No mass enhancement. The hepatic portal main vein is normal caliber. Unremarkable gallbladder and bile ducts. Pancreas: No abnormality. Spleen: Enlarged measuring 16 cm in length.  No mass. Adrenals/Urinary Tract: There is no right adrenal mass. In the medial limb of left adrenal gland, there is a 1.3 cm nodule, Hounsfield density ranges from 50-63. Chemical shift MRI recommended. There is a 1 cm hypodense lesion in the outer midpole left kidney, Hounsfield density is 37. Indeterminate due to density. Attention on MRI recommended. The bilateral kidneys are otherwise unremarkable with symmetric  excretion in the delayed phase. There are no urinary stone or obstruction. Bladder is unremarkable for the degree of distention. Stomach/Bowel: No dilatation or wall thickening  including of the retrocecal appendix. There is sigmoid diverticulosis without evidence of focal colitis or diverticulitis. Vascular/Lymphatic: Somewhat age-advanced abdominal aortoiliac atherosclerosis. No enlarged abdominal or pelvic lymph nodes. Reproductive: 2.8 cm left ovarian cystic lesion, Hounsfield density is 21 with somewhat irregular appearance to the wall. Pelvic ultrasound recommended. The uterus and adnexal structures are otherwise unremarkable. Other: No free fluid, free air or incarcerated hernia. Musculoskeletal: No acute or significant osseous findings. IMPRESSION: 1. 2.8 cm left ovarian cystic lesion with somewhat irregular appearance to the wall. Pelvic ultrasound recommended. 2. 1.3 cm left adrenal indeterminate nodule. Chemical shift MRI recommended. 3. 1 cm indeterminate hypodense lesion in the outer midpole left kidney. Attention on MRI recommended. 4. Mild cardiomegaly. 5. Aortoiliac atherosclerosis. 6. Emphysema and bronchitis. 7. Faint hazy left lower lobe infrahilar opacities which could be due to mosaicism from air trapping and small airway disease, pneumonitis or ground-glass scarring. Three-month follow-up chest CT recommended to ensure clearing or stability. 8. 5 mm and two 3 mm pulmonary nodules. Attention on follow-up recommended. 9. Hepatosplenomegaly with mild hepatic steatosis. 10. Diverticulosis without evidence of diverticulitis. Aortic Atherosclerosis (ICD10-I70.0) and Emphysema (ICD10-J43.9). Electronically Signed   By: Almira Bar M.D.   On: 10/02/2023 21:59   ECHOCARDIOGRAM COMPLETE  Result Date: 10/02/2023    ECHOCARDIOGRAM REPORT   Patient Name:   MAGARET LAHMAN Date of Exam: 10/02/2023 Medical Rec #:  [id number]      Height:       61.0 in Accession #:    [id number]     Weight:       175.0 lb Date of Birth:  12/13/1975      BSA:          1.785 m Patient Age:    47 years       BP:           146/72 mmHg Patient Gender: F              HR:           68 bpm. Exam  [facility] Procedure: 2D Echo, 3D Echo, Cardiac Doppler, Color Doppler and Strain Analysis Indications:   Stroke  History:       Patient has no prior history of Echocardiogram examinations.                Stroke.  Sonographer:   Karma Ganja Referring      [id number] Onnie Boer Phys:  Sonographer Comments: Global longitudinal strain was attempted. IMPRESSIONS  1. Left ventricular ejection fraction, by estimation, is 65 to 70%. The left ventricle has normal function. Left ventricular endocardial border not optimally defined to evaluate regional wall motion. Left ventricular diastolic parameters were normal.  2. Right ventricular systolic function is normal. The right ventricular size is normal. Tricuspid regurgitation signal is inadequate for assessing PA pressure.  3. The mitral valve is grossly normal. Trivial mitral valve regurgitation.  4. The aortic valve was not well visualized. Aortic valve regurgitation is not visualized. Aortic valve mean gradient measures 6.0 mmHg.  5. The inferior vena cava is normal in size with <50% respiratory variability, suggesting right atrial pressure of 8 mmHg. Comparison(s): No prior Echocardiogram. FINDINGS  Left Ventricle: Left ventricular ejection fraction, by estimation, is 65 to 70%. The left ventricle has normal function. Left ventricular endocardial border not optimally  defined to evaluate regional wall motion. Global longitudinal strain performed but  not reported based on interpreter judgement due to suboptimal tracking. The left ventricular internal cavity size was normal in size. There is no left ventricular hypertrophy. Left ventricular diastolic parameters were normal. Right Ventricle: The right ventricular size is normal. No increase in right ventricular wall thickness. Right ventricular systolic function is normal. Tricuspid regurgitation signal is inadequate for assessing PA pressure. Left Atrium: Left atrial size was normal in size. Right Atrium:  Right atrial size was normal in size. Pericardium: There is no evidence of pericardial effusion. Presence of epicardial fat layer. Mitral Valve: The mitral valve is grossly normal. There is mild calcification of the mitral valve leaflet(s). Mild mitral annular calcification. Trivial mitral valve regurgitation. Tricuspid Valve: The tricuspid valve is grossly normal. Tricuspid valve regurgitation is trivial. Aortic Valve: The aortic valve was not well visualized. Aortic valve regurgitation is not visualized. Aortic valve mean gradient measures 6.0 mmHg. Aortic valve peak gradient measures 10.8 mmHg. Aortic valve area, by VTI measures 1.95 cm. Pulmonic Valve: The pulmonic valve was not well visualized. Pulmonic valve regurgitation is not visualized. Aorta: The aortic root is normal in size and structure. Venous: The inferior vena cava is normal in size with less than 50% respiratory variability, suggesting right atrial pressure of 8 mmHg. IAS/Shunts: No atrial level shunt detected by color flow Doppler.  LEFT VENTRICLE PLAX 2D LVIDd:         5.50 cm   Diastology LVIDs:         3.50 cm   LV e' medial:    8.92 cm/s LV PW:         1.00 cm   LV E/e' medial:  14.1 LV IVS:        0.90 cm   LV e' lateral:   9.57 cm/s LVOT diam:     2.00 cm   LV E/e' lateral: 13.2 LV SV:         73 LV SV Index:   41 LVOT Area:     3.14 cm                           3D Volume EF:                          3D EF:        58 %                          LV EDV:       143 ml                          LV ESV:       60 ml                          LV SV:        83 ml RIGHT VENTRICLE             IVC RV Basal diam:  2.60 cm     IVC diam: 1.80 cm RV S prime:     14.10 cm/s TAPSE (M-mode): 3.3 cm LEFT ATRIUM             Index        RIGHT ATRIUM  Index LA diam:        3.20 cm 1.79 cm/m   RA Area:     14.30 cm LA Vol (A2C):   91.1 ml 51.05 ml/m  RA Volume:   33.80 ml  18.94 ml/m LA Vol (A4C):   50.1 ml 28.07 ml/m LA Biplane Vol: 70.0 ml 39.23  ml/m  AORTIC VALVE AV Area (Vmax):    1.99 cm AV Area (Vmean):   1.94 cm AV Area (VTI):     1.95 cm AV Vmax:           164.00 cm/s AV Vmean:          110.000 cm/s AV VTI:            0.374 m AV Peak Grad:      10.8 mmHg AV Mean Grad:      6.0 mmHg LVOT Vmax:         104.00 cm/s LVOT Vmean:        68.100 cm/s LVOT VTI:          0.232 m LVOT/AV VTI ratio: 0.62  AORTA Ao Root diam: 2.70 cm MITRAL VALVE MV Area (PHT): 3.50 cm     SHUNTS MV Decel Time: 217 msec     Systemic VTI:  0.23 m MV E velocity: 126.00 cm/s  Systemic Diam: 2.00 cm MV A velocity: 119.00 cm/s MV E/A ratio:  1.06 Nona Dell MD Electronically signed by Nona Dell MD Signature Date/Time: 10/02/2023/10:22:17 AM    Final    MR BRAIN WO CONTRAST  Addendum Date: 10/01/2023   ADDENDUM REPORT: 10/01/2023 21:00 ADDENDUM: Correction: The infarct is in the medulla, not the midbrain. Electronically Signed   By: Feliberto Harts M.D.   On: 10/01/2023 21:00   Result Date: 10/01/2023 CLINICAL DATA:  Neuro deficit, acute, stroke suspected EXAM: MRI HEAD WITHOUT CONTRAST TECHNIQUE: Multiplanar, multiecho pulse sequences of the brain and surrounding structures were obtained without intravenous contrast. COMPARISON:  CTA head/neck from today. FINDINGS: Brain: Acute infarct in the right midbrain. Slight edema without mass effect. Age advanced T2/FLAIR hyperintensities in the white matter. No acute hemorrhage, mass lesion, midline shift or hydrocephalus. Vascular: Major arterial flow voids are maintained skull base. Skull and upper cervical spine: Normal marrow signal. Sinuses/Orbits: Negative. IMPRESSION: 1. Acute infarct in the right midbrain. 2. Moderate T2/FLAIR hyperintensities in the white matter, which are age advanced. These most likely represent accelerated chronic microvascular ischemic disease, but chronic demyelination could have a similar appearance. Electronically Signed: By: Feliberto Harts M.D. On: 10/01/2023 17:53   CT ANGIO  HEAD NECK W WO CM  Result Date: 10/01/2023 CLINICAL DATA:  Neuro deficit, acute, stroke suspected EXAM: CT ANGIOGRAPHY HEAD AND NECK WITH AND WITHOUT CONTRAST TECHNIQUE: Multidetector CT imaging of the head and neck was performed using the standard protocol during bolus administration of intravenous contrast. Multiplanar CT image reconstructions and MIPs were obtained to evaluate the vascular anatomy. Carotid stenosis measurements (when applicable) are obtained utilizing NASCET criteria, using the distal internal carotid diameter as the denominator. RADIATION DOSE REDUCTION: This exam was performed according to the departmental dose-optimization program which includes automated exposure control, adjustment of the mA and/or kV according to patient size and/or use of iterative reconstruction technique. CONTRAST:  75mL OMNIPAQUE IOHEXOL 350 MG/ML SOLN COMPARISON:  None Available. FINDINGS: CT HEAD FINDINGS Brain: No evidence of acute infarction, hemorrhage, hydrocephalus, extra-axial collection or mass lesion/mass effect. Vascular: See below. Skull: No acute fracture. Sinuses/Orbits: No acute finding. Other: No mastoid  effusions. Review of the MIP images confirms the above findings CTA NECK FINDINGS Aortic arch: Great vessel origins are patent without significant stenosis. Right carotid system: No evidence of dissection, stenosis (50% or greater), or occlusion. Left carotid system: No evidence of dissection, stenosis (50% or greater), or occlusion. Vertebral arteries: Severe right vertebral artery origin stenosis. Left vertebral artery is patent. Skeleton: No acute fracture. Other neck: No acute abnormality on limited assessment. Upper chest: Visualized lung apices are clear. Review of the MIP images confirms the above findings CTA HEAD FINDINGS Anterior circulation: Bilateral intracranial ICAs, MCAs and ACAs are patent. Moderate bilateral paraclinoid ICA stenosis Posterior circulation: Bilateral intradural  vertebral arteries, basilar artery and bilateral posterior cerebral arteries are patent proximally. The left PCA is small with suspected superimposed moderate stenosis of the P2 PCA which is irregular Venous sinuses: As permitted by contrast timing, patent. Review of the MIP images confirms the above findings IMPRESSION: 1. No emergent large vessel occlusion. 2. The left PCA is small with suspected superimposed moderate to severe stenosis of the P2 PCA which is irregular and diminutive. 3. Moderate bilateral paraclinoid ICA stenosis. 4. Severe right vertebral artery origin stenosis. Electronically Signed   By: Feliberto Harts M.D.   On: 10/01/2023 17:27    Microbiology: No results found for this or any previous visit.  Labs: CBC: Recent Labs  Lab 10/01/23 1456  WBC 9.6  NEUTROABS 7.5  HGB 15.2*  HCT 44.0  MCV 91.7  PLT 243   Basic Metabolic Panel: Recent Labs  Lab 10/01/23 1456  NA 132*  K 3.6  CL 100  CO2 22  GLUCOSE 233*  BUN 15  CREATININE 0.57  CALCIUM 8.6*  MG 1.9   Liver Function Tests: Recent Labs  Lab 10/01/23 1456  AST 23  ALT 31  ALKPHOS 82  BILITOT 0.5  PROT 6.9  ALBUMIN 3.8   CBG: Recent Labs  Lab 10/01/23 1415  GLUCAP 234*    Discharge time spent: greater than 30 minutes.  Signed: Catarina Hartshorn, MD [facility] 10/03/2023

## 2023-10-03 NOTE — Plan of Care (Signed)
  Problem: Education: Goal: Knowledge of General Education information will improve Description: Including pain rating scale, medication(s)/side effects and non-pharmacologic comfort measures Outcome: Progressing   Problem: Health Behavior/Discharge Planning: Goal: Ability to manage health-related needs will improve Outcome: Progressing   Problem: Clinical Measurements: Goal: Ability to maintain clinical measurements within normal limits will improve Outcome: Progressing   Problem: Coping: Goal: Level of anxiety will decrease Outcome: Progressing   Problem: Elimination: Goal: Will not experience complications related to bowel motility Outcome: Progressing   Problem: Safety: Goal: Ability to remain free from injury will improve Outcome: Progressing   Problem: Education: Goal: Knowledge of disease or condition will improve Outcome: Progressing Goal: Knowledge of secondary prevention will improve (MUST DOCUMENT ALL) Outcome: Progressing Goal: Knowledge of patient specific risk factors will improve Loraine Leriche N/A or DELETE if not current risk factor) Outcome: Progressing   Problem: Ischemic Stroke/TIA Tissue Perfusion: Goal: Complications of ischemic stroke/TIA will be minimized Outcome: Progressing   Problem: Coping: Goal: Will verbalize positive feelings about self Outcome: Progressing Goal: Will identify appropriate support needs Outcome: Progressing   Problem: Health Behavior/Discharge Planning: Goal: Ability to manage health-related needs will improve Outcome: Progressing Goal: Goals will be collaboratively established with patient/family Outcome: Progressing   Problem: Ischemic Stroke/TIA Tissue Perfusion: Goal: Complications of ischemic stroke/TIA will be minimized Outcome: Progressing   Problem: Health Behavior/Discharge Planning: Goal: Ability to manage health-related needs will improve Outcome: Progressing Goal: Goals will be collaboratively established with  patient/family Outcome: Progressing   Problem: Self-Care: Goal: Ability to participate in self-care as condition permits will improve Outcome: Progressing Goal: Verbalization of feelings and concerns over difficulty with self-care will improve Outcome: Progressing Goal: Ability to communicate needs accurately will improve Outcome: Progressing   Problem: Nutrition: Goal: Risk of aspiration will decrease Outcome: Progressing Goal: Dietary intake will improve Outcome: Progressing

## 2023-10-04 DIAGNOSIS — I10 Essential (primary) hypertension: Secondary | ICD-10-CM | POA: Diagnosis not present

## 2023-10-04 DIAGNOSIS — E1165 Type 2 diabetes mellitus with hyperglycemia: Secondary | ICD-10-CM | POA: Insufficient documentation

## 2023-10-04 DIAGNOSIS — I639 Cerebral infarction, unspecified: Secondary | ICD-10-CM | POA: Diagnosis not present

## 2023-10-04 DIAGNOSIS — Z720 Tobacco use: Secondary | ICD-10-CM | POA: Diagnosis not present

## 2023-10-04 LAB — BASIC METABOLIC PANEL
Anion gap: 12 (ref 5–15)
BUN: 21 mg/dL — ABNORMAL HIGH (ref 6–20)
CO2: 23 mmol/L (ref 22–32)
Calcium: 9.1 mg/dL (ref 8.9–10.3)
Chloride: 101 mmol/L (ref 98–111)
Creatinine, Ser: 0.58 mg/dL (ref 0.44–1.00)
GFR, Estimated: >60 mL/min (ref >60)
Glucose, Bld: 157 mg/dL — ABNORMAL HIGH (ref 70–99)
Potassium: 3.7 mmol/L (ref 3.5–5.1)
Sodium: 136 mmol/L (ref 135–145)

## 2023-10-04 LAB — LUPUS ANTICOAGULANT PANEL
DRVVT: 48.7 s — ABNORMAL HIGH (ref 0.0–47.0)
PTT Lupus Anticoagulant: 50 s — ABNORMAL HIGH (ref 0.0–43.5)

## 2023-10-04 LAB — MAGNESIUM: Magnesium: 2 mg/dL (ref 1.7–2.4)

## 2023-10-04 LAB — PROTEIN S ACTIVITY: Protein S Activity: 31 % — ABNORMAL LOW (ref 63–140)

## 2023-10-04 LAB — GLUCOSE, CAPILLARY
Glucose-Capillary: 157 mg/dL — ABNORMAL HIGH (ref 70–99)
Glucose-Capillary: 171 mg/dL — ABNORMAL HIGH (ref 70–99)
Glucose-Capillary: 229 mg/dL — ABNORMAL HIGH (ref 70–99)
Glucose-Capillary: 156 mg/dL — ABNORMAL HIGH (ref 70–99)
Glucose-Capillary: 182 mg/dL — ABNORMAL HIGH (ref 70–99)

## 2023-10-04 LAB — PROTEIN C ACTIVITY: Protein C Activity: 114 % (ref 73–180)

## 2023-10-04 LAB — PROTEIN S, TOTAL: Protein S Ag, Total: 76 % (ref 60–150)

## 2023-10-04 LAB — CARDIOLIPIN ANTIBODIES, IGG, IGM, IGA
Anticardiolipin IgA: <9 U/mL (ref 0–11)
Anticardiolipin IgG: <9 [GPL'U]/mL (ref 0–14)
Anticardiolipin IgM: 79 [MPL'U]/mL — ABNORMAL HIGH (ref 0–12)

## 2023-10-04 LAB — HEXAGONAL PHASE PHOSPHOLIPID: Hexagonal Phase Phospholipid: 16 s — ABNORMAL HIGH (ref 0–11)

## 2023-10-04 LAB — DRVVT MIX: dRVVT Mix: 40.1 s (ref 0.0–40.4)

## 2023-10-04 LAB — PROTEIN C, TOTAL: Protein C, Total: 92 % (ref 60–150)

## 2023-10-04 LAB — PTT-LA MIX: PTT-LA Mix: 45.5 s — ABNORMAL HIGH (ref 0.0–40.5)

## 2023-10-04 MED ORDER — AMLODIPINE BESYLATE 5 MG PO TABS
5.0000 mg | ORAL_TABLET | Freq: Every day | ORAL | Status: DC
  Administered 2023-10-04 – 2023-10-06 (×3): 5 mg via ORAL
  Filled 2023-10-04 (×3): qty 1

## 2023-10-04 NOTE — Plan of Care (Signed)

## 2023-10-04 NOTE — Progress Notes (Addendum)
PROGRESS NOTE  Melissa Wilkerson:[id number] DOB: [birth year] DOA: 10/01/2023 PCP: Pcp, No  Brief History:  47 y/o female with a history of hypertension, tobacco abuse, and diabetes presenting with numbness on her right face and right hand up to her elbow that began at 7 AM on 10/01/2023.  The patient states that she had some numbness in her right foot up to her right knee about a week prior to this admission lasting " a few minutes".  She states that on and off for the past week she has noted that she has been " leaning to the right" with ambulation.  However she denies any frank leg weakness or falling.  She has had some dizziness but denies any visual disturbance, syncope, chest pain, shortness of breath.  She has not had any fevers, chills, nausea, vomiting or diarrhea, abdominal pain.  Spouse reported that the patient speech has been slow, but there has been no dysarthria.  Because of her right-sided numbness and dizziness, the patient presented for further evaluation and treatment. In the ED, the patient was afebrile and hemodynamically stable with oxygen saturation 99% room air.  WBC 9.6, hemoglobin 15.2, platelets 243.  Sodium 132, potassium 3.6, bicarbonate 22, serum creatinine 0.57.  LFTs unremarkable.  UA was negative for pyuria.  Urine drug screen was negative.  MRI brain showed an acute infarct in the right midbrain.  CTA head and neck was negative for LVO.  Showed moderate to severe stenosis of the P2 PCA.  The patient was started on aspirin and Plavix.  She was admitted for further evaluation and treatment of her stroke. Neurology was consulted and recommended DAPT x 3 weeks followed by ASA 81 mg  daily.  CT CAP was ordered by neurology with results discussed below.  PT recommended CIR   Assessment/Plan:  Acute ischemic stroke -Appreciate Neurology Consult>>hypercoagulable work up ordered as well as CT CAP to look for occult malignancy -PT/OT evaluation>>CIR -Speech therapy  eval -MRI brain--acute infarct right midbrain -CTA H&N--negative LVO.  Moderate severe stenosis P2 PCA, moderate bilateral paraclinoid ICA stenosis.  Severe right vertebral artery stenosis -Echo--EF 65-70%, not optimal to eval WMA, no PFO, trivial MR -LDL--unable to calculate secondary to elevated triglycerides -HbA1C--7.4 -Antiplatelet--ASA 81 mg and plavix 75 mg daily   Essential hypertension -not on any medications as an outpatient -Allowing for permissive hypertension temporarily -Plan to start antihypertensive medications at time of discharge>>start amlodipine   Mixed hyperlipidemia -Unable to calculate LDL secondary to elevated triglycerides -Triglycerides 481 -Total cholesterol 241 -direct LDL 164 -started lipitor 40 mg daily   Impaired glucose tolerance>>new diagnosis DM2 with hyperglycemia -Hemoglobin A1c 7.4 -03/03/2021 hemoglobin A1c 6.2 -start novolog sliding scale -start metformin after d/c   Tobacco abuse -Tobacco cessation discussed   Left lung nodules/R-lung nodule -noted on CT chest -will need outpatient surveillance -pt and spouse updated   Left ovarian cystic lesion -11/15 pelvic ultrasound--normal ovarian follicles bilateral--no masses -outpatient GYN follow up--I've set up with Dr. Despina Hidden on 10/22/13 at 11AM   Left adrenal nodule -outpatient MR and surveillance         Family Communication:   spouse at bedside 11/16   Consultants:  CIR, neurology   Code Status:  FULL    DVT Prophylaxis: [place] Lovenox     Procedures: As Listed in Progress Note Above   Antibiotics: None        Subjective:  Pt still has some residual numbness to right  face, but it is improving.  Denies f/c, cp, sob, n/v/d Objective: Vitals:   10/03/23 1428 10/03/23 1956 10/03/23 2346 10/04/23 0355  BP: (!) 157/72 (!) 144/70 (!) 153/70 (!) 161/95  Pulse: 78 77 74 78  Resp:  20 16 16   Temp: 98.3 F (36.8 C) 99.1 F (37.3 C) 98.2 F (36.8 C) 98 F (36.7 C)   TempSrc: Oral     SpO2: 97% 97% 97% 95%  Weight:        Intake/Output Summary (Last 24 hours) at 10/04/2023 0915 Last data filed at 10/03/2023 1715 Gross per 24 hour  Intake 360 ml  Output --  Net 360 ml   Weight change:  Exam:  General:  Pt is alert, follows commands appropriately, not in acute distress HEENT: No icterus, No thrush, No neck mass, [place]/AT Cardiovascular: RRR, S1/S2, no rubs, no gallops Respiratory: bibasilar crackles. No wheeze Abdomen: Soft/+BS, non tender, non distended, no guarding Extremities: No edema, No lymphangitis, No petechiae, No rashes, no synovitis   Data Reviewed: I have personally reviewed following labs and imaging studies Basic Metabolic Panel: Recent Labs  Lab 10/01/23 1456 10/04/23 0459  NA 132* 136  K 3.6 3.7  CL 100 101  CO2 22 23  GLUCOSE 233* 157*  BUN 15 21*  CREATININE 0.57 0.58  CALCIUM 8.6* 9.1  MG 1.9 2.0   Liver Function Tests: Recent Labs  Lab 10/01/23 1456  AST 23  ALT 31  ALKPHOS 82  BILITOT 0.5  PROT 6.9  ALBUMIN 3.8   No results for input(s): "LIPASE", "AMYLASE" in the last 168 hours. No results for input(s): "AMMONIA" in the last 168 hours. Coagulation Profile: Recent Labs  Lab 10/01/23 1456  INR 1.0   CBC: Recent Labs  Lab 10/01/23 1456  WBC 9.6  NEUTROABS 7.5  HGB 15.2*  HCT 44.0  MCV 91.7  PLT 243   Cardiac Enzymes: No results for input(s): "CKTOTAL", "CKMB", "CKMBINDEX", "TROPONINI" in the last 168 hours. BNP: Invalid input(s): "POCBNP" CBG: Recent Labs  Lab 10/01/23 1415 10/03/23 1958 10/03/23 2348 10/04/23 0355 10/04/23 0744  GLUCAP 234* 205* 147* 157* 171*   HbA1C: Recent Labs    10/01/23 1456  HGBA1C 7.4*   Urine analysis:    Component Value Date/Time   COLORURINE YELLOW 10/01/2023 1706   APPEARANCEUR CLEAR 10/01/2023 1706   LABSPEC 1.040 (H) 10/01/2023 1706   PHURINE 7.0 10/01/2023 1706   GLUCOSEU >=500 (A) 10/01/2023 1706   HGBUR NEGATIVE 10/01/2023 1706    BILIRUBINUR NEGATIVE 10/01/2023 1706   KETONESUR NEGATIVE 10/01/2023 1706   PROTEINUR 30 (A) 10/01/2023 1706   NITRITE NEGATIVE 10/01/2023 1706   LEUKOCYTESUR NEGATIVE 10/01/2023 1706   Sepsis Labs: @LABRCNTIP (procalcitonin:4,lacticidven:4) ) Recent Results (from the past 240 hour(s))  SARS Coronavirus 2 by RT PCR (hospital order, performed in [facility] hospital lab) *cepheid single result test* Anterior Nasal Swab     Status: None   Collection Time: 10/03/23  7:32 AM   Specimen: Anterior Nasal Swab  Result Value Ref Range Status   SARS Coronavirus 2 by RT PCR NEGATIVE NEGATIVE Final    Comment: (NOTE) SARS-CoV-2 target nucleic acids are NOT DETECTED.  The SARS-CoV-2 RNA is generally detectable in upper and lower respiratory specimens during the acute phase of infection. The lowest concentration of SARS-CoV-2 viral copies this assay can detect is 250 copies / mL. A negative result does not preclude SARS-CoV-2 infection and should not be used as the sole basis for treatment or other patient  management decisions.  A negative result may occur with improper specimen collection / handling, submission of specimen other than nasopharyngeal swab, presence of viral mutation(s) within the areas targeted by this assay, and inadequate number of viral copies (<250 copies / mL). A negative result must be combined with clinical observations, patient history, and epidemiological information.  Fact Sheet for Patients:   RoadLapTop.co.za  Fact Sheet for Healthcare Providers: http://kim-miller.com/  This test is not yet approved or  cleared by the [place] FDA and has been authorized for detection and/or diagnosis of SARS-CoV-2 by FDA under an Emergency Use Authorization (EUA).  This EUA will remain in effect (meaning this test can be used) for the duration of the COVID-19 declaration under Section 564(b)(1) of the Act, 21 U.S.C. section  360bbb-3(b)(1), unless the authorization is terminated or revoked sooner.  Performed at [facility], [address] [place], [place] [address]      Scheduled Meds:  amLODipine  5 mg Oral Daily   aspirin EC  81 mg Oral Daily   atorvastatin  40 mg Oral Daily   clopidogrel  75 mg Oral Daily   enoxaparin (LOVENOX) injection  40 mg Subcutaneous Q24H   insulin aspart  0-9 Units Subcutaneous TID WC   Continuous Infusions:  Procedures/Studies: US PELVIC COMPLETE WITH TRANSVAGINAL  Result Date: 10/03/2023 CLINICAL DATA:  Left ovarian cyst on CT yesterday EXAM: TRANSABDOMINAL AND TRANSVAGINAL ULTRASOUND OF PELVIS TECHNIQUE: Both transabdominal and transvaginal ultrasound examinations of the pelvis were performed. Transabdominal technique was performed for global imaging of the pelvis including uterus, ovaries, adnexal regions, and pelvic cul-de-sac. It was necessary to proceed with endovaginal exam following the transabdominal exam to visualize the 10/02/2023. COMPARISON:  10/02/2023 FINDINGS: Uterus Measurements: 8.6 x 4.5 x 5.7 cm = volume: 115.5 mL. No fibroids or other mass visualized. Endometrium Thickness: 10 mm.  No focal abnormality visualized. Right ovary Measurements: 2.8 x 1.6 x 1.8 cm = volume: 4.1 mL. Normal appearance/no adnexal mass. Left ovary Measurements: 3.1 x 1.8 by 2.3 cm = volume: 6.5 mL. Normal follicles are seen within the left ovary, largest measuring 1.8 cm. No adnexal masses. Other findings Trace pelvic free fluid within the cul-de-sac and left adnexa, likely physiologic. IMPRESSION: 1. Normal appearing follicles within the left ovary. No adnexal masses. 2. Trace pelvic free fluid, likely physiologic. Electronically Signed   By: Sharlet Salina M.D.   On: 10/03/2023 16:42   CT CHEST ABDOMEN PELVIS W CONTRAST  Result Date: 10/02/2023 CLINICAL DATA:  Inpatient encounter with suspected occult malignancy. 47 year old female. Menopausal status is not known. EXAM: CT CHEST,  ABDOMEN, AND PELVIS WITH CONTRAST TECHNIQUE: Multidetector CT imaging of the chest, abdomen and pelvis was performed following the standard protocol during bolus administration of intravenous contrast. RADIATION DOSE REDUCTION: This exam was performed according to the departmental dose-optimization program which includes automated exposure control, adjustment of the mA and/or kV according to patient size and/or use of iterative reconstruction technique. CONTRAST:  OMNIPAQUE IOHEXOL 300 MG/ML SOLN, 30mL OMNIPAQUE IOHEXOL 300 MG/ML SOLN COMPARISON:  No relevant prior study. No prior body CT for comparison. FINDINGS: CT CHEST FINDINGS Cardiovascular: There is mild cardiomegaly. There is no pericardial effusion. There are calcific plaques in the distal aortic arch and aortic annulus, but no visible coronary calcifications. The great vessels are patent. There is no aortic or great vessel stenosis, dissection or aneurysm. There are nonstenosing soft plaques at the origins of the brachiocephalic and left common carotid arteries. Pulmonary veins and arteries  are normal caliber. The pulmonary arteries are centrally clear. Mediastinum/Nodes: No thyroid nodule is seen. There are several bilateral borderline sized axillary space lymph nodes, largest on the right is 9 mm in short axis. Largest on the left is 8 mm in short axis. There are 2 minimally prominent left hilar lymph nodes each measuring 10 mm in short axis. No other intrathoracic adenopathy is seen. There are shotty subcentimeter scattered mediastinal nodes which are not pathologic by size criteria. The thoracic trachea, main bronchi and thoracic esophagus unremarkable. Lungs/Pleura: No pleural effusion, thickening or pneumothorax. There is asymmetric posterior subpleural atelectasis in the left lower lobe. Faint hazy left lower lobe infrahilar opacities are noted and could be due to mosaic changes from air trapping and small airway disease or could indicate a  minimal pneumonitis. Also could be ground-glass scarring. Three-month follow-up chest CT recommended to ensure clearing or stability. There are mild features of paraseptal emphysema in the upper lobes. Mild central bronchial thickening bilaterally. There is a 5 mm left upper lobe nodule on 3:62, a 3 mm left upper lobe nodule on 3:52, and a 3 mm right middle lobe nodule on 3:81. The lungs are otherwise clear. Musculoskeletal: There is degenerative disc disease and spondylosis with slight dextroscoliosis of the thoracic spine. No regional suspicious bone lesion is seen. No chest wall mass is evident. CT ABDOMEN PELVIS FINDINGS Hepatobiliary: Enlarged, mildly steatotic, 23 cm length. No mass enhancement. The hepatic portal main vein is normal caliber. Unremarkable gallbladder and bile ducts. Pancreas: No abnormality. Spleen: Enlarged measuring 16 cm in length.  No mass. Adrenals/Urinary Tract: There is no right adrenal mass. In the medial limb of left adrenal gland, there is a 1.3 cm nodule, Hounsfield density ranges from 50-63. Chemical shift MRI recommended. There is a 1 cm hypodense lesion in the outer midpole left kidney, Hounsfield density is 37. Indeterminate due to density. Attention on MRI recommended. The bilateral kidneys are otherwise unremarkable with symmetric excretion in the delayed phase. There are no urinary stone or obstruction. Bladder is unremarkable for the degree of distention. Stomach/Bowel: No dilatation or wall thickening including of the retrocecal appendix. There is sigmoid diverticulosis without evidence of focal colitis or diverticulitis. Vascular/Lymphatic: Somewhat age-advanced abdominal aortoiliac atherosclerosis. No enlarged abdominal or pelvic lymph nodes. Reproductive: 2.8 cm left ovarian cystic lesion, Hounsfield density is 21 with somewhat irregular appearance to the wall. Pelvic ultrasound recommended. The uterus and adnexal structures are otherwise unremarkable. Other: No free  fluid, free air or incarcerated hernia. Musculoskeletal: No acute or significant osseous findings. IMPRESSION: 1. 2.8 cm left ovarian cystic lesion with somewhat irregular appearance to the wall. Pelvic ultrasound recommended. 2. 1.3 cm left adrenal indeterminate nodule. Chemical shift MRI recommended. 3. 1 cm indeterminate hypodense lesion in the outer midpole left kidney. Attention on MRI recommended. 4. Mild cardiomegaly. 5. Aortoiliac atherosclerosis. 6. Emphysema and bronchitis. 7. Faint hazy left lower lobe infrahilar opacities which could be due to mosaicism from air trapping and small airway disease, pneumonitis or ground-glass scarring. Three-month follow-up chest CT recommended to ensure clearing or stability. 8. 5 mm and two 3 mm pulmonary nodules. Attention on follow-up recommended. 9. Hepatosplenomegaly with mild hepatic steatosis. 10. Diverticulosis without evidence of diverticulitis. Aortic Atherosclerosis (ICD10-I70.0) and Emphysema (ICD10-J43.9). Electronically Signed   By: Almira Bar M.D.   On: 10/02/2023 21:59   ECHOCARDIOGRAM COMPLETE  Result Date: 10/02/2023    ECHOCARDIOGRAM REPORT   Patient Name:   Melissa Wilkerson Date of Exam: 10/02/2023 Medical Rec #:  [id number]      Height:       61.0 in Accession #:    [id number]     Weight:       175.0 lb Date of Birth:  [birth year]      BSA:          1.785 m Patient Age:    47 years       BP:           146/72 mmHg Patient Gender: F              HR:           68 bpm. [facility] Procedure: 2D Echo, 3D Echo, Cardiac Doppler, Color Doppler and Strain Analysis Indications:   Stroke  History:       Patient has no prior history of Echocardiogram examinations.                Stroke.  Sonographer:   Karma Ganja Referring      [id number] Onnie Boer Phys:  Sonographer Comments: Global longitudinal strain was attempted. IMPRESSIONS  1. Left ventricular ejection fraction, by estimation, is 65 to 70%. The left ventricle has normal function.  Left ventricular endocardial border not optimally defined to evaluate regional wall motion. Left ventricular diastolic parameters were normal.  2. Right ventricular systolic function is normal. The right ventricular size is normal. Tricuspid regurgitation signal is inadequate for assessing PA pressure.  3. The mitral valve is grossly normal. Trivial mitral valve regurgitation.  4. The aortic valve was not well visualized. Aortic valve regurgitation is not visualized. Aortic valve mean gradient measures 6.0 mmHg.  5. The inferior vena cava is normal in size with <50% respiratory variability, suggesting right atrial pressure of 8 mmHg. Comparison(s): No prior Echocardiogram. FINDINGS  Left Ventricle: Left ventricular ejection fraction, by estimation, is 65 to 70%. The left ventricle has normal function. Left ventricular endocardial border not optimally defined to evaluate regional wall motion. Global longitudinal strain performed but  not reported based on interpreter judgement due to suboptimal tracking. The left ventricular internal cavity size was normal in size. There is no left ventricular hypertrophy. Left ventricular diastolic parameters were normal. Right Ventricle: The right ventricular size is normal. No increase in right ventricular wall thickness. Right ventricular systolic function is normal. Tricuspid regurgitation signal is inadequate for assessing PA pressure. Left Atrium: Left atrial size was normal in size. Right Atrium: Right atrial size was normal in size. Pericardium: There is no evidence of pericardial effusion. Presence of epicardial fat layer. Mitral Valve: The mitral valve is grossly normal. There is mild calcification of the mitral valve leaflet(s). Mild mitral annular calcification. Trivial mitral valve regurgitation. Tricuspid Valve: The tricuspid valve is grossly normal. Tricuspid valve regurgitation is trivial. Aortic Valve: The aortic valve was not well visualized. Aortic valve  regurgitation is not visualized. Aortic valve mean gradient measures 6.0 mmHg. Aortic valve peak gradient measures 10.8 mmHg. Aortic valve area, by VTI measures 1.95 cm. Pulmonic Valve: The pulmonic valve was not well visualized. Pulmonic valve regurgitation is not visualized. Aorta: The aortic root is normal in size and structure. Venous: The inferior vena cava is normal in size with less than 50% respiratory variability, suggesting right atrial pressure of 8 mmHg. IAS/Shunts: No atrial level shunt detected by color flow Doppler.  LEFT VENTRICLE PLAX 2D LVIDd:         5.50 cm   Diastology LVIDs:  3.50 cm   LV e' medial:    8.92 cm/s LV PW:         1.00 cm   LV E/e' medial:  14.1 LV IVS:        0.90 cm   LV e' lateral:   9.57 cm/s LVOT diam:     2.00 cm   LV E/e' lateral: 13.2 LV SV:         73 LV SV Index:   41 LVOT Area:     3.14 cm                           3D Volume EF:                          3D EF:        58 %                          LV EDV:       143 ml                          LV ESV:       60 ml                          LV SV:        83 ml RIGHT VENTRICLE             IVC RV Basal diam:  2.60 cm     IVC diam: 1.80 cm RV S prime:     14.10 cm/s TAPSE (M-mode): 3.3 cm LEFT ATRIUM             Index        RIGHT ATRIUM           Index LA diam:        3.20 cm 1.79 cm/m   RA Area:     14.30 cm LA Vol (A2C):   91.1 ml 51.05 ml/m  RA Volume:   33.80 ml  18.94 ml/m LA Vol (A4C):   50.1 ml 28.07 ml/m LA Biplane Vol: 70.0 ml 39.23 ml/m  AORTIC VALVE AV Area (Vmax):    1.99 cm AV Area (Vmean):   1.94 cm AV Area (VTI):     1.95 cm AV Vmax:           164.00 cm/s AV Vmean:          110.000 cm/s AV VTI:            0.374 m AV Peak Grad:      10.8 mmHg AV Mean Grad:      6.0 mmHg LVOT Vmax:         104.00 cm/s LVOT Vmean:        68.100 cm/s LVOT VTI:          0.232 m LVOT/AV VTI ratio: 0.62  AORTA Ao Root diam: 2.70 cm MITRAL VALVE MV Area (PHT): 3.50 cm     SHUNTS MV Decel Time: 217 msec     Systemic  VTI:  0.23 m MV E velocity: 126.00 cm/s  Systemic Diam: 2.00 cm MV A velocity: 119.00 cm/s MV E/A ratio:  1.06 Nona Dell MD Electronically signed by Nona Dell MD Signature Date/Time: 10/02/2023/10:22:17 AM    Final    MR  BRAIN WO CONTRAST  Addendum Date: 10/01/2023   ADDENDUM REPORT: 10/01/2023 21:00 ADDENDUM: Correction: The infarct is in the medulla, not the midbrain. Electronically Signed   By: Feliberto Harts M.D.   On: 10/01/2023 21:00   Result Date: 10/01/2023 CLINICAL DATA:  Neuro deficit, acute, stroke suspected EXAM: MRI HEAD WITHOUT CONTRAST TECHNIQUE: Multiplanar, multiecho pulse sequences of the brain and surrounding structures were obtained without intravenous contrast. COMPARISON:  CTA head/neck from today. FINDINGS: Brain: Acute infarct in the right midbrain. Slight edema without mass effect. Age advanced T2/FLAIR hyperintensities in the white matter. No acute hemorrhage, mass lesion, midline shift or hydrocephalus. Vascular: Major arterial flow voids are maintained skull base. Skull and upper cervical spine: Normal marrow signal. Sinuses/Orbits: Negative. IMPRESSION: 1. Acute infarct in the right midbrain. 2. Moderate T2/FLAIR hyperintensities in the white matter, which are age advanced. These most likely represent accelerated chronic microvascular ischemic disease, but chronic demyelination could have a similar appearance. Electronically Signed: By: Feliberto Harts M.D. On: 10/01/2023 17:53   CT ANGIO HEAD NECK W WO CM  Result Date: 10/01/2023 CLINICAL DATA:  Neuro deficit, acute, stroke suspected EXAM: CT ANGIOGRAPHY HEAD AND NECK WITH AND WITHOUT CONTRAST TECHNIQUE: Multidetector CT imaging of the head and neck was performed using the standard protocol during bolus administration of intravenous contrast. Multiplanar CT image reconstructions and MIPs were obtained to evaluate the vascular anatomy. Carotid stenosis measurements (when applicable) are obtained utilizing  NASCET criteria, using the distal internal carotid diameter as the denominator. RADIATION DOSE REDUCTION: This exam was performed according to the departmental dose-optimization program which includes automated exposure control, adjustment of the mA and/or kV according to patient size and/or use of iterative reconstruction technique. CONTRAST:  75mL OMNIPAQUE IOHEXOL 350 MG/ML SOLN COMPARISON:  None Available. FINDINGS: CT HEAD FINDINGS Brain: No evidence of acute infarction, hemorrhage, hydrocephalus, extra-axial collection or mass lesion/mass effect. Vascular: See below. Skull: No acute fracture. Sinuses/Orbits: No acute finding. Other: No mastoid effusions. Review of the MIP images confirms the above findings CTA NECK FINDINGS Aortic arch: Great vessel origins are patent without significant stenosis. Right carotid system: No evidence of dissection, stenosis (50% or greater), or occlusion. Left carotid system: No evidence of dissection, stenosis (50% or greater), or occlusion. Vertebral arteries: Severe right vertebral artery origin stenosis. Left vertebral artery is patent. Skeleton: No acute fracture. Other neck: No acute abnormality on limited assessment. Upper chest: Visualized lung apices are clear. Review of the MIP images confirms the above findings CTA HEAD FINDINGS Anterior circulation: Bilateral intracranial ICAs, MCAs and ACAs are patent. Moderate bilateral paraclinoid ICA stenosis Posterior circulation: Bilateral intradural vertebral arteries, basilar artery and bilateral posterior cerebral arteries are patent proximally. The left PCA is small with suspected superimposed moderate stenosis of the P2 PCA which is irregular Venous sinuses: As permitted by contrast timing, patent. Review of the MIP images confirms the above findings IMPRESSION: 1. No emergent large vessel occlusion. 2. The left PCA is small with suspected superimposed moderate to severe stenosis of the P2 PCA which is irregular and  diminutive. 3. Moderate bilateral paraclinoid ICA stenosis. 4. Severe right vertebral artery origin stenosis. Electronically Signed   By: Feliberto Harts M.D.   On: 10/01/2023 17:27    Catarina Hartshorn, DO  [facility]  If 7PM-7AM, please contact night-coverage [email or web address] Password [email or web address] 10/04/2023, 9:15 AM   LOS: 3 days

## 2023-10-04 NOTE — Progress Notes (Signed)
Physical Therapy Treatment Patient Details Name: Melissa Wilkerson MRN: [id number] DOB: 09/04/1976 Today's Date: 10/04/2023   History of Present Illness Melissa Wilkerson is a 47 y.o. female with medical history significant for HTN, tobacco abuse, prediabetes.  Patient presented to the ED with complaints of numbness to the right side of her face and right hand that started about 7 AM this morning.  Family also reports that was leaning to the right today when walking.  Spouse reports patient speech is slow, but no slurred or garbled.  Patient also reports onset of dizziness today.  Patient's son at bedside reports that about a week ago patient was complaining of some abnormal sensation and numbness to her right lower extremity.  No prior strokes.  She does not take any medication illicit or prescribed.  She smokes a pack of cigarettes daily.  She is not aware of a family history of stroke.  She reports a history of hypertension but she is not taking any medication for this.    PT Comments  On therapist arrival; patient is sitting on the edge of her bed; her father is present at bedside.  Patient agreeable to therapist treatment.  Patient performs sit to stand from edge of bed to RW with CGA for safety and ambulates x 50 ft out into the hallway with RW and CGA/min A for safety; has some difficulty with turns and direction changes and tends to list to the right as she fatigues.  PT then guides patient in seated and standing lower extremity therapeutic exercises without issue demonstrating  fatigue more quickly right leg versus left.  Patient left in bed with call button in reach, her father at bedside.  Patient will benefit from continued skilled therapy services during the remainder of her hospital stay and at the next recommended venue of care to address deficits and promote return to optimal function.       If plan is discharge home, recommend the following: A lot of help with walking and/or transfers;A little  help with bathing/dressing/bathroom;Help with stairs or ramp for entrance;Assistance with cooking/housework   Can travel by private Scientist, research (medical) walker (2 wheels)    Recommendations for Other Services       Precautions / Restrictions Precautions Precautions: Fall Restrictions Weight Bearing Restrictions: No     Mobility  Bed Mobility               General bed mobility comments: sitting at edge of bed on therapist arrival Patient Response: Cooperative  Transfers Overall transfer level: Needs assistance Equipment used: Rolling walker (2 wheels) Transfers: Sit to/from Stand Sit to Stand: Contact guard assist                Ambulation/Gait Ambulation/Gait assistance: Min assist, Mod assist Gait Distance (Feet): 50 Feet Assistive device: Rolling walker (2 wheels) Gait Pattern/deviations: Decreased step length - right, Decreased step length - left, Decreased stride length, Antalgic, Ataxic, Wide base of support, Staggering right Gait velocity: decreased     General Gait Details: ambulates with RW and CGA/min A; starts to list to the right as she fatigues   Stairs             Wheelchair Mobility     Tilt Bed Tilt Bed Patient Response: Cooperative  Modified Rankin (Stroke Patients Only)       Balance Overall balance assessment: Needs assistance Sitting-balance support: Feet supported, No upper extremity supported Sitting balance-Leahy Scale:  Good Sitting balance - Comments: seated at EOB   Standing balance support: During functional activity, No upper extremity supported Standing balance-Leahy Scale: Poor Standing balance comment: fair with CGA                            Cognition Arousal: Alert Behavior During Therapy: WFL for tasks assessed/performed Overall Cognitive Status: Within Functional Limits for tasks assessed                                 General Comments:  pleasant and cooperative with therapy        Exercises Other Exercises Other Exercises: seated: LAQs, hip flexion and heel/toe raises x 10 each; standing heel raises and mini squats holding to the RW for safety    General Comments        Pertinent Vitals/Pain Pain Assessment Pain Assessment: No/denies pain    Home Living                          Prior Function            PT Goals (current goals can now be found in the care plan section) Acute Rehab PT Goals Patient Stated Goal: return home with family to assist PT Goal Formulation: With patient/family Time For Goal Achievement: 10/16/23 Potential to Achieve Goals: Good    Frequency    Min 4X/week      PT Plan      Co-evaluation              AM-PAC PT "6 Clicks" Mobility   Outcome Measure  Help needed turning from your back to your side while in a flat bed without using bedrails?: None Help needed moving from lying on your back to sitting on the side of a flat bed without using bedrails?: None Help needed moving to and from a bed to a chair (including a wheelchair)?: A Little Help needed standing up from a chair using your arms (e.g., wheelchair or bedside chair)?: A Little Help needed to walk in hospital room?: A Lot Help needed climbing 3-5 steps with a railing? : A Lot 6 Click Score: 18    End of Session   Activity Tolerance: Patient tolerated treatment well;Patient limited by fatigue Patient left: in bed;with call bell/phone within reach;with family/visitor present Nurse Communication: Mobility status PT Visit Diagnosis: Unsteadiness on feet (R26.81);Other abnormalities of gait and mobility (R26.89);Muscle weakness (generalized) (M62.81)     Time: 1000-1020 PT Time Calculation (min) (ACUTE ONLY): 20 min  Charges:    $Therapeutic Exercise: 8-22 mins PT General Charges $$ ACUTE PT VISIT: 1 Visit                     10:29 AM, 10/04/23 Daegan Arizmendi Small Lyndzie Zentz MPT [facility] [place] [phone] Ph:[phone]

## 2023-10-04 NOTE — Progress Notes (Signed)
Patient alert and verbal, tolerated medications whole with no complaints. NIH completed every 4 hours per order, patient scored a zero during shift. Patient ambulated with one assist in the room and hallway. Patient educated on allowing staff to assist patient with ambulating due to patients complaints of dizziness and numbness.

## 2023-10-05 DIAGNOSIS — I639 Cerebral infarction, unspecified: Secondary | ICD-10-CM | POA: Diagnosis not present

## 2023-10-05 DIAGNOSIS — I10 Essential (primary) hypertension: Secondary | ICD-10-CM | POA: Diagnosis not present

## 2023-10-05 DIAGNOSIS — E1165 Type 2 diabetes mellitus with hyperglycemia: Secondary | ICD-10-CM | POA: Diagnosis not present

## 2023-10-05 LAB — GLUCOSE, CAPILLARY
Glucose-Capillary: 157 mg/dL — ABNORMAL HIGH (ref 70–99)
Glucose-Capillary: 275 mg/dL — ABNORMAL HIGH (ref 70–99)
Glucose-Capillary: 155 mg/dL — ABNORMAL HIGH (ref 70–99)
Glucose-Capillary: 149 mg/dL — ABNORMAL HIGH (ref 70–99)

## 2023-10-05 NOTE — Progress Notes (Signed)
Patient alert and oriented x4. No complaints of pain this shift. NIH scale completed Q4, patient scoring a 0.

## 2023-10-05 NOTE — Plan of Care (Signed)
  Problem: Education: Goal: Knowledge of General Education information will improve Description: Including pain rating scale, medication(s)/side effects and non-pharmacologic comfort measures Outcome: Progressing   Problem: Health Behavior/Discharge Planning: Goal: Ability to manage health-related needs will improve Outcome: Progressing   Problem: Clinical Measurements: Goal: Ability to maintain clinical measurements within normal limits will improve Outcome: Progressing Goal: Will remain free from infection Outcome: Progressing Goal: Diagnostic test results will improve Outcome: Progressing Goal: Respiratory complications will improve Outcome: Progressing Goal: Cardiovascular complication will be avoided Outcome: Progressing   Problem: Activity: Goal: Risk for activity intolerance will decrease Outcome: Progressing   Problem: Nutrition: Goal: Adequate nutrition will be maintained Outcome: Progressing   Problem: Coping: Goal: Level of anxiety will decrease Outcome: Progressing   Problem: Elimination: Goal: Will not experience complications related to bowel motility Outcome: Progressing Goal: Will not experience complications related to urinary retention Outcome: Progressing   Problem: Pain Management: Goal: General experience of comfort will improve Outcome: Progressing   Problem: Safety: Goal: Ability to remain free from injury will improve Outcome: Progressing   Problem: Skin Integrity: Goal: Risk for impaired skin integrity will decrease Outcome: Progressing   Problem: Education: Goal: Knowledge of disease or condition will improve Outcome: Progressing Goal: Knowledge of secondary prevention will improve (MUST DOCUMENT ALL) Outcome: Progressing Goal: Knowledge of patient specific risk factors will improve Loraine Leriche N/A or DELETE if not current risk factor) Outcome: Progressing   Problem: Ischemic Stroke/TIA Tissue Perfusion: Goal: Complications of ischemic  stroke/TIA will be minimized Outcome: Progressing   Problem: Coping: Goal: Will verbalize positive feelings about self Outcome: Progressing Goal: Will identify appropriate support needs Outcome: Progressing   Problem: Health Behavior/Discharge Planning: Goal: Ability to manage health-related needs will improve Outcome: Progressing Goal: Goals will be collaboratively established with patient/family Outcome: Progressing   Problem: Self-Care: Goal: Ability to participate in self-care as condition permits will improve Outcome: Progressing Goal: Verbalization of feelings and concerns over difficulty with self-care will improve Outcome: Progressing Goal: Ability to communicate needs accurately will improve Outcome: Progressing   Problem: Nutrition: Goal: Risk of aspiration will decrease Outcome: Progressing Goal: Dietary intake will improve Outcome: Progressing   Problem: Education: Goal: Ability to describe self-care measures that may prevent or decrease complications (Diabetes Survival Skills Education) will improve Outcome: Progressing Goal: Individualized Educational Video(s) Outcome: Progressing   Problem: Coping: Goal: Ability to adjust to condition or change in health will improve Outcome: Progressing   Problem: Fluid Volume: Goal: Ability to maintain a balanced intake and output will improve Outcome: Progressing   Problem: Health Behavior/Discharge Planning: Goal: Ability to identify and utilize available resources and services will improve Outcome: Progressing Goal: Ability to manage health-related needs will improve Outcome: Progressing   Problem: Metabolic: Goal: Ability to maintain appropriate glucose levels will improve Outcome: Progressing   Problem: Nutritional: Goal: Maintenance of adequate nutrition will improve Outcome: Progressing Goal: Progress toward achieving an optimal weight will improve Outcome: Progressing   Problem: Skin  Integrity: Goal: Risk for impaired skin integrity will decrease Outcome: Progressing   Problem: Tissue Perfusion: Goal: Adequacy of tissue perfusion will improve Outcome: Progressing

## 2023-10-05 NOTE — Progress Notes (Signed)
PROGRESS NOTE  Melissa Wilkerson NWG:[id number] DOB: 07/06/1976 DOA: 10/01/2023 PCP: Pcp, No  Brief History:  47 y/o female with a history of hypertension, tobacco abuse, and diabetes presenting with numbness on her right face and right hand up to her elbow that began at 7 AM on 10/01/2023.  The patient states that she had some numbness in her right foot up to her right knee about a week prior to this admission lasting " a few minutes".  She states that on and off for the past week she has noted that she has been " leaning to the right" with ambulation.  However she denies any frank leg weakness or falling.  She has had some dizziness but denies any visual disturbance, syncope, chest pain, shortness of breath.  She has not had any fevers, chills, nausea, vomiting or diarrhea, abdominal pain.  Spouse reported that the patient speech has been slow, but there has been no dysarthria.  Because of her right-sided numbness and dizziness, the patient presented for further evaluation and treatment. In the ED, the patient was afebrile and hemodynamically stable with oxygen saturation 99% room air.  WBC 9.6, hemoglobin 15.2, platelets 243.  Sodium 132, potassium 3.6, bicarbonate 22, serum creatinine 0.57.  LFTs unremarkable.  UA was negative for pyuria.  Urine drug screen was negative.  MRI brain showed an acute infarct in the right midbrain.  CTA head and neck was negative for LVO.  Showed moderate to severe stenosis of the P2 PCA.  The patient was started on aspirin and Plavix.  She was admitted for further evaluation and treatment of her stroke. Neurology was consulted and recommended DAPT x 3 weeks followed by ASA 81 mg  daily.  CT CAP was ordered by neurology with results discussed below.  PT recommended CIR   Assessment/Plan: Acute ischemic stroke -Appreciate Neurology Consult>>hypercoagulable work up ordered as well as CT CAP to look for occult malignancy -PT/OT evaluation>>CIR -Speech therapy  eval appreciated -MRI brain--acute infarct right medulla -CTA H&N--negative LVO.  Moderate severe stenosis P2 PCA, moderate bilateral paraclinoid ICA stenosis.  Severe right vertebral artery stenosis -Echo--EF 65-70%, not optimal to eval WMA, no PFO, trivial MR -LDL--unable to calculate secondary to elevated triglycerides -HbA1C--7.4 -Antiplatelet--ASA 81 mg and plavix 75 mg daily x 3 weeks, then ASA 81 mg daily   Essential hypertension -not on any medications as an outpatient -Allowing for permissive hypertension temporarily -Plan to start antihypertensive medications at time of discharge>>start amlodipine   Mixed hyperlipidemia -Unable to calculate LDL secondary to elevated triglycerides -Triglycerides 481 -Total cholesterol 241 -direct LDL 164 -started lipitor 40 mg daily   Impaired glucose tolerance>>new diagnosis DM2 with hyperglycemia -Hemoglobin A1c 7.4 -03/03/2021 hemoglobin A1c 6.2 -start novolog sliding scale -start metformin after d/c   Tobacco abuse -Tobacco cessation discussed   Left lung nodules/R-lung nodule -noted on CT chest -will need outpatient surveillance -pt and spouse updated   Left ovarian cystic lesion -11/15 pelvic ultrasound--normal ovarian follicles bilateral--no masses -outpatient GYN follow up--I've set up with Dr. Despina Hidden on 10/22/13 at 11AM   Left adrenal nodule -outpatient MR and surveillance         Family Communication:   spouse at bedside 11/17   Consultants:  CIR, neurology   Code Status:  FULL    DVT Prophylaxis: [place] Lovenox     Procedures: As Listed in Progress Note Above   Antibiotics: None        Subjective: Patient  denies fevers, chills, headache, chest pain, dyspnea, nausea, vomiting, diarrhea, abdominal pain, dysuria, hematuria, hematochezia, and melena.   Objective: Vitals:   10/05/23 0100 10/05/23 0500 10/05/23 0530 10/05/23 1307  BP: (!) 146/70 (!) 151/68 (!) 134/56 (!) 140/60  Pulse: 73 74 71 70  Resp:    15 17  Temp: 97.9 F (36.6 C) 98 F (36.7 C) 98.1 F (36.7 C) 97.9 F (36.6 C)  TempSrc: Oral Oral Oral   SpO2: 97% 98% 98% 99%  Weight:        Intake/Output Summary (Last 24 hours) at 10/05/2023 1723 Last data filed at 10/05/2023 1300 Gross per 24 hour  Intake 480 ml  Output --  Net 480 ml   Weight change:  Exam:  General:  Pt is alert, follows commands appropriately, not in acute distress HEENT: No icterus, No thrush, No neck mass, [place]/AT Cardiovascular: RRR, S1/S2, no rubs, no gallops Respiratory: bibasilar crackles. No wheeze Abdomen: Soft/+BS, non tender, non distended, no guarding Extremities: No edema, No lymphangitis, No petechiae, No rashes, no synovitis   Data Reviewed: I have personally reviewed following labs and imaging studies Basic Metabolic Panel: Recent Labs  Lab 10/01/23 1456 10/04/23 0459  NA 132* 136  K 3.6 3.7  CL 100 101  CO2 22 23  GLUCOSE 233* 157*  BUN 15 21*  CREATININE 0.57 0.58  CALCIUM 8.6* 9.1  MG 1.9 2.0   Liver Function Tests: Recent Labs  Lab 10/01/23 1456  AST 23  ALT 31  ALKPHOS 82  BILITOT 0.5  PROT 6.9  ALBUMIN 3.8   No results for input(s): "LIPASE", "AMYLASE" in the last 168 hours. No results for input(s): "AMMONIA" in the last 168 hours. Coagulation Profile: Recent Labs  Lab 10/01/23 1456  INR 1.0   CBC: Recent Labs  Lab 10/01/23 1456  WBC 9.6  NEUTROABS 7.5  HGB 15.2*  HCT 44.0  MCV 91.7  PLT 243   Cardiac Enzymes: No results for input(s): "CKTOTAL", "CKMB", "CKMBINDEX", "TROPONINI" in the last 168 hours. BNP: Invalid input(s): "POCBNP" CBG: Recent Labs  Lab 10/04/23 1650 10/04/23 2136 10/05/23 0715 10/05/23 1108 10/05/23 1623  GLUCAP 156* 182* 157* 275* 155*   HbA1C: No results for input(s): "HGBA1C" in the last 72 hours. Urine analysis:    Component Value Date/Time   COLORURINE YELLOW 10/01/2023 1706   APPEARANCEUR CLEAR 10/01/2023 1706   LABSPEC 1.040 (H) 10/01/2023 1706    PHURINE 7.0 10/01/2023 1706   GLUCOSEU >=500 (A) 10/01/2023 1706   HGBUR NEGATIVE 10/01/2023 1706   BILIRUBINUR NEGATIVE 10/01/2023 1706   KETONESUR NEGATIVE 10/01/2023 1706   PROTEINUR 30 (A) 10/01/2023 1706   NITRITE NEGATIVE 10/01/2023 1706   LEUKOCYTESUR NEGATIVE 10/01/2023 1706   Sepsis Labs: @LABRCNTIP (procalcitonin:4,lacticidven:4) ) Recent Results (from the past 240 hour(s))  SARS Coronavirus 2 by RT PCR (hospital order, performed in [facility] hospital lab) *cepheid single result test* Anterior Nasal Swab     Status: None   Collection Time: 10/03/23  7:32 AM   Specimen: Anterior Nasal Swab  Result Value Ref Range Status   SARS Coronavirus 2 by RT PCR NEGATIVE NEGATIVE Final    Comment: (NOTE) SARS-CoV-2 target nucleic acids are NOT DETECTED.  The SARS-CoV-2 RNA is generally detectable in upper and lower respiratory specimens during the acute phase of infection. The lowest concentration of SARS-CoV-2 viral copies this assay can detect is 250 copies / mL. A negative result does not preclude SARS-CoV-2 infection and should not be used as the sole  basis for treatment or other patient management decisions.  A negative result may occur with improper specimen collection / handling, submission of specimen other than nasopharyngeal swab, presence of viral mutation(s) within the areas targeted by this assay, and inadequate number of viral copies (<250 copies / mL). A negative result must be combined with clinical observations, patient history, and epidemiological information.  Fact Sheet for Patients:   RoadLapTop.co.za  Fact Sheet for Healthcare Providers: http://kim-miller.com/  This test is not yet approved or  cleared by the [place] FDA and has been authorized for detection and/or diagnosis of SARS-CoV-2 by FDA under an Emergency Use Authorization (EUA).  This EUA will remain in effect (meaning this test can be used) for  the duration of the COVID-19 declaration under Section 564(b)(1) of the Act, 21 U.S.C. section 360bbb-3(b)(1), unless the authorization is terminated or revoked sooner.  Performed at [facility] [address] [place], [place] [address]      Scheduled Meds:  amLODipine  5 mg Oral Daily   aspirin EC  81 mg Oral Daily   atorvastatin  40 mg Oral Daily   clopidogrel  75 mg Oral Daily   enoxaparin (LOVENOX) injection  40 mg Subcutaneous Q24H   insulin aspart  0-9 Units Subcutaneous TID WC   Continuous Infusions:  Procedures/Studies: US PELVIC COMPLETE WITH TRANSVAGINAL  Result Date: 10/03/2023 CLINICAL DATA:  Left ovarian cyst on CT yesterday EXAM: TRANSABDOMINAL AND TRANSVAGINAL ULTRASOUND OF PELVIS TECHNIQUE: Both transabdominal and transvaginal ultrasound examinations of the pelvis were performed. Transabdominal technique was performed for global imaging of the pelvis including uterus, ovaries, adnexal regions, and pelvic cul-de-sac. It was necessary to proceed with endovaginal exam following the transabdominal exam to visualize the 10/02/2023. COMPARISON:  10/02/2023 FINDINGS: Uterus Measurements: 8.6 x 4.5 x 5.7 cm = volume: 115.5 mL. No fibroids or other mass visualized. Endometrium Thickness: 10 mm.  No focal abnormality visualized. Right ovary Measurements: 2.8 x 1.6 x 1.8 cm = volume: 4.1 mL. Normal appearance/no adnexal mass. Left ovary Measurements: 3.1 x 1.8 by 2.3 cm = volume: 6.5 mL. Normal follicles are seen within the left ovary, largest measuring 1.8 cm. No adnexal masses. Other findings Trace pelvic free fluid within the cul-de-sac and left adnexa, likely physiologic. IMPRESSION: 1. Normal appearing follicles within the left ovary. No adnexal masses. 2. Trace pelvic free fluid, likely physiologic. Electronically Signed   By: Sharlet Salina M.D.   On: 10/03/2023 16:42   CT CHEST ABDOMEN PELVIS W CONTRAST  Result Date: 10/02/2023 CLINICAL DATA:  Inpatient encounter with  suspected occult malignancy. 47 year old female. Menopausal status is not known. EXAM: CT CHEST, ABDOMEN, AND PELVIS WITH CONTRAST TECHNIQUE: Multidetector CT imaging of the chest, abdomen and pelvis was performed following the standard protocol during bolus administration of intravenous contrast. RADIATION DOSE REDUCTION: This exam was performed according to the departmental dose-optimization program which includes automated exposure control, adjustment of the mA and/or kV according to patient size and/or use of iterative reconstruction technique. CONTRAST:  OMNIPAQUE IOHEXOL 300 MG/ML SOLN, 30mL OMNIPAQUE IOHEXOL 300 MG/ML SOLN COMPARISON:  No relevant prior study. No prior body CT for comparison. FINDINGS: CT CHEST FINDINGS Cardiovascular: There is mild cardiomegaly. There is no pericardial effusion. There are calcific plaques in the distal aortic arch and aortic annulus, but no visible coronary calcifications. The great vessels are patent. There is no aortic or great vessel stenosis, dissection or aneurysm. There are nonstenosing soft plaques at the origins of the brachiocephalic and left common  carotid arteries. Pulmonary veins and arteries are normal caliber. The pulmonary arteries are centrally clear. Mediastinum/Nodes: No thyroid nodule is seen. There are several bilateral borderline sized axillary space lymph nodes, largest on the right is 9 mm in short axis. Largest on the left is 8 mm in short axis. There are 2 minimally prominent left hilar lymph nodes each measuring 10 mm in short axis. No other intrathoracic adenopathy is seen. There are shotty subcentimeter scattered mediastinal nodes which are not pathologic by size criteria. The thoracic trachea, main bronchi and thoracic esophagus unremarkable. Lungs/Pleura: No pleural effusion, thickening or pneumothorax. There is asymmetric posterior subpleural atelectasis in the left lower lobe. Faint hazy left lower lobe infrahilar opacities are noted and  could be due to mosaic changes from air trapping and small airway disease or could indicate a minimal pneumonitis. Also could be ground-glass scarring. Three-month follow-up chest CT recommended to ensure clearing or stability. There are mild features of paraseptal emphysema in the upper lobes. Mild central bronchial thickening bilaterally. There is a 5 mm left upper lobe nodule on 3:62, a 3 mm left upper lobe nodule on 3:52, and a 3 mm right middle lobe nodule on 3:81. The lungs are otherwise clear. Musculoskeletal: There is degenerative disc disease and spondylosis with slight dextroscoliosis of the thoracic spine. No regional suspicious bone lesion is seen. No chest wall mass is evident. CT ABDOMEN PELVIS FINDINGS Hepatobiliary: Enlarged, mildly steatotic, 23 cm length. No mass enhancement. The hepatic portal main vein is normal caliber. Unremarkable gallbladder and bile ducts. Pancreas: No abnormality. Spleen: Enlarged measuring 16 cm in length.  No mass. Adrenals/Urinary Tract: There is no right adrenal mass. In the medial limb of left adrenal gland, there is a 1.3 cm nodule, Hounsfield density ranges from 50-63. Chemical shift MRI recommended. There is a 1 cm hypodense lesion in the outer midpole left kidney, Hounsfield density is 37. Indeterminate due to density. Attention on MRI recommended. The bilateral kidneys are otherwise unremarkable with symmetric excretion in the delayed phase. There are no urinary stone or obstruction. Bladder is unremarkable for the degree of distention. Stomach/Bowel: No dilatation or wall thickening including of the retrocecal appendix. There is sigmoid diverticulosis without evidence of focal colitis or diverticulitis. Vascular/Lymphatic: Somewhat age-advanced abdominal aortoiliac atherosclerosis. No enlarged abdominal or pelvic lymph nodes. Reproductive: 2.8 cm left ovarian cystic lesion, Hounsfield density is 21 with somewhat irregular appearance to the wall. Pelvic ultrasound  recommended. The uterus and adnexal structures are otherwise unremarkable. Other: No free fluid, free air or incarcerated hernia. Musculoskeletal: No acute or significant osseous findings. IMPRESSION: 1. 2.8 cm left ovarian cystic lesion with somewhat irregular appearance to the wall. Pelvic ultrasound recommended. 2. 1.3 cm left adrenal indeterminate nodule. Chemical shift MRI recommended. 3. 1 cm indeterminate hypodense lesion in the outer midpole left kidney. Attention on MRI recommended. 4. Mild cardiomegaly. 5. Aortoiliac atherosclerosis. 6. Emphysema and bronchitis. 7. Faint hazy left lower lobe infrahilar opacities which could be due to mosaicism from air trapping and small airway disease, pneumonitis or ground-glass scarring. Three-month follow-up chest CT recommended to ensure clearing or stability. 8. 5 mm and two 3 mm pulmonary nodules. Attention on follow-up recommended. 9. Hepatosplenomegaly with mild hepatic steatosis. 10. Diverticulosis without evidence of diverticulitis. Aortic Atherosclerosis (ICD10-I70.0) and Emphysema (ICD10-J43.9). Electronically Signed   By: Almira Bar M.D.   On: 10/02/2023 21:59   ECHOCARDIOGRAM COMPLETE  Result Date: 10/02/2023    ECHOCARDIOGRAM REPORT   Patient Name:   [facility] Date  of Exam: 10/02/2023 Medical Rec #:  [id number]      Height:       61.0 in Accession #:    [id number]     Weight:       175.0 lb Date of Birth:  1976-05-26      BSA:          1.785 m Patient Age:    47 years       BP:           146/72 mmHg Patient Gender: F              HR:           68 bpm. [facility] Procedure: 2D Echo, 3D Echo, Cardiac Doppler, Color Doppler and Strain Analysis Indications:   Stroke  History:       Patient has no prior history of Echocardiogram examinations.                Stroke.  Sonographer:   Karma Ganja Referring      [id number]nie Boer Phys:  Sonographer Comments: Global longitudinal strain was attempted. IMPRESSIONS  1. Left  ventricular ejection fraction, by estimation, is 65 to 70%. The left ventricle has normal function. Left ventricular endocardial border not optimally defined to evaluate regional wall motion. Left ventricular diastolic parameters were normal.  2. Right ventricular systolic function is normal. The right ventricular size is normal. Tricuspid regurgitation signal is inadequate for assessing PA pressure.  3. The mitral valve is grossly normal. Trivial mitral valve regurgitation.  4. The aortic valve was not well visualized. Aortic valve regurgitation is not visualized. Aortic valve mean gradient measures 6.0 mmHg.  5. The inferior vena cava is normal in size with <50% respiratory variability, suggesting right atrial pressure of 8 mmHg. Comparison(s): No prior Echocardiogram. FINDINGS  Left Ventricle: Left ventricular ejection fraction, by estimation, is 65 to 70%. The left ventricle has normal function. Left ventricular endocardial border not optimally defined to evaluate regional wall motion. Global longitudinal strain performed but  not reported based on interpreter judgement due to suboptimal tracking. The left ventricular internal cavity size was normal in size. There is no left ventricular hypertrophy. Left ventricular diastolic parameters were normal. Right Ventricle: The right ventricular size is normal. No increase in right ventricular wall thickness. Right ventricular systolic function is normal. Tricuspid regurgitation signal is inadequate for assessing PA pressure. Left Atrium: Left atrial size was normal in size. Right Atrium: Right atrial size was normal in size. Pericardium: There is no evidence of pericardial effusion. Presence of epicardial fat layer. Mitral Valve: The mitral valve is grossly normal. There is mild calcification of the mitral valve leaflet(s). Mild mitral annular calcification. Trivial mitral valve regurgitation. Tricuspid Valve: The tricuspid valve is grossly normal. Tricuspid valve  regurgitation is trivial. Aortic Valve: The aortic valve was not well visualized. Aortic valve regurgitation is not visualized. Aortic valve mean gradient measures 6.0 mmHg. Aortic valve peak gradient measures 10.8 mmHg. Aortic valve area, by VTI measures 1.95 cm. Pulmonic Valve: The pulmonic valve was not well visualized. Pulmonic valve regurgitation is not visualized. Aorta: The aortic root is normal in size and structure. Venous: The inferior vena cava is normal in size with less than 50% respiratory variability, suggesting right atrial pressure of 8 mmHg. IAS/Shunts: No atrial level shunt detected by color flow Doppler.  LEFT VENTRICLE PLAX 2D LVIDd:         5.50 cm   Diastology LVIDs:  3.50 cm   LV e' medial:    8.92 cm/s LV PW:         1.00 cm   LV E/e' medial:  14.1 LV IVS:        0.90 cm   LV e' lateral:   9.57 cm/s LVOT diam:     2.00 cm   LV E/e' lateral: 13.2 LV SV:         73 LV SV Index:   41 LVOT Area:     3.14 cm                           3D Volume EF:                          3D EF:        58 %                          LV EDV:       143 ml                          LV ESV:       60 ml                          LV SV:        83 ml RIGHT VENTRICLE             IVC RV Basal diam:  2.60 cm     IVC diam: 1.80 cm RV S prime:     14.10 cm/s TAPSE (M-mode): 3.3 cm LEFT ATRIUM             Index        RIGHT ATRIUM           Index LA diam:        3.20 cm 1.79 cm/m   RA Area:     14.30 cm LA Vol (A2C):   91.1 ml 51.05 ml/m  RA Volume:   33.80 ml  18.94 ml/m LA Vol (A4C):   50.1 ml 28.07 ml/m LA Biplane Vol: 70.0 ml 39.23 ml/m  AORTIC VALVE AV Area (Vmax):    1.99 cm AV Area (Vmean):   1.94 cm AV Area (VTI):     1.95 cm AV Vmax:           164.00 cm/s AV Vmean:          110.000 cm/s AV VTI:            0.374 m AV Peak Grad:      10.8 mmHg AV Mean Grad:      6.0 mmHg LVOT Vmax:         104.00 cm/s LVOT Vmean:        68.100 cm/s LVOT VTI:          0.232 m LVOT/AV VTI ratio: 0.62  AORTA Ao Root diam:  2.70 cm MITRAL VALVE MV Area (PHT): 3.50 cm     SHUNTS MV Decel Time: 217 msec     Systemic VTI:  0.23 m MV E velocity: 126.00 cm/s  Systemic Diam: 2.00 cm MV A velocity: 119.00 cm/s MV E/A ratio:  1.06 Nona Dell MD Electronically signed by Nona Dell MD Signature Date/Time: 10/02/2023/10:22:17 AM    Final    MR  BRAIN WO CONTRAST  Addendum Date: 10/01/2023   ADDENDUM REPORT: 10/01/2023 21:00 ADDENDUM: Correction: The infarct is in the medulla, not the midbrain. Electronically Signed   By: Feliberto Harts M.D.   On: 10/01/2023 21:00   Result Date: 10/01/2023 CLINICAL DATA:  Neuro deficit, acute, stroke suspected EXAM: MRI HEAD WITHOUT CONTRAST TECHNIQUE: Multiplanar, multiecho pulse sequences of the brain and surrounding structures were obtained without intravenous contrast. COMPARISON:  CTA head/neck from today. FINDINGS: Brain: Acute infarct in the right midbrain. Slight edema without mass effect. Age advanced T2/FLAIR hyperintensities in the white matter. No acute hemorrhage, mass lesion, midline shift or hydrocephalus. Vascular: Major arterial flow voids are maintained skull base. Skull and upper cervical spine: Normal marrow signal. Sinuses/Orbits: Negative. IMPRESSION: 1. Acute infarct in the right midbrain. 2. Moderate T2/FLAIR hyperintensities in the white matter, which are age advanced. These most likely represent accelerated chronic microvascular ischemic disease, but chronic demyelination could have a similar appearance. Electronically Signed: By: Feliberto Harts M.D. On: 10/01/2023 17:53   CT ANGIO HEAD NECK W WO CM  Result Date: 10/01/2023 CLINICAL DATA:  Neuro deficit, acute, stroke suspected EXAM: CT ANGIOGRAPHY HEAD AND NECK WITH AND WITHOUT CONTRAST TECHNIQUE: Multidetector CT imaging of the head and neck was performed using the standard protocol during bolus administration of intravenous contrast. Multiplanar CT image reconstructions and MIPs were obtained to evaluate  the vascular anatomy. Carotid stenosis measurements (when applicable) are obtained utilizing NASCET criteria, using the distal internal carotid diameter as the denominator. RADIATION DOSE REDUCTION: This exam was performed according to the departmental dose-optimization program which includes automated exposure control, adjustment of the mA and/or kV according to patient size and/or use of iterative reconstruction technique. CONTRAST:  75mL OMNIPAQUE IOHEXOL 350 MG/ML SOLN COMPARISON:  None Available. FINDINGS: CT HEAD FINDINGS Brain: No evidence of acute infarction, hemorrhage, hydrocephalus, extra-axial collection or mass lesion/mass effect. Vascular: See below. Skull: No acute fracture. Sinuses/Orbits: No acute finding. Other: No mastoid effusions. Review of the MIP images confirms the above findings CTA NECK FINDINGS Aortic arch: Great vessel origins are patent without significant stenosis. Right carotid system: No evidence of dissection, stenosis (50% or greater), or occlusion. Left carotid system: No evidence of dissection, stenosis (50% or greater), or occlusion. Vertebral arteries: Severe right vertebral artery origin stenosis. Left vertebral artery is patent. Skeleton: No acute fracture. Other neck: No acute abnormality on limited assessment. Upper chest: Visualized lung apices are clear. Review of the MIP images confirms the above findings CTA HEAD FINDINGS Anterior circulation: Bilateral intracranial ICAs, MCAs and ACAs are patent. Moderate bilateral paraclinoid ICA stenosis Posterior circulation: Bilateral intradural vertebral arteries, basilar artery and bilateral posterior cerebral arteries are patent proximally. The left PCA is small with suspected superimposed moderate stenosis of the P2 PCA which is irregular Venous sinuses: As permitted by contrast timing, patent. Review of the MIP images confirms the above findings IMPRESSION: 1. No emergent large vessel occlusion. 2. The left PCA is small with  suspected superimposed moderate to severe stenosis of the P2 PCA which is irregular and diminutive. 3. Moderate bilateral paraclinoid ICA stenosis. 4. Severe right vertebral artery origin stenosis. Electronically Signed   By: Feliberto Harts M.D.   On: 10/01/2023 17:27    Catarina Hartshorn, DO  [facility]  If 7PM-7AM, please contact night-coverage [email or web address] Password [email or web address] 10/05/2023, 5:23 PM   LOS: 4 days

## 2023-10-05 NOTE — PMR Pre-admission (Signed)
PMR Admission Coordinator Pre-Admission Assessment  Patient: Melissa Wilkerson is an 47 y.o., female MRN: [id number] DOB: Apr 14, 1976 Height: 5\' 2"  (157.5 cm) Weight: 79.4 kg  Insurance Information HMO:     PPO: yes     PCP:      IPA:      80/20:      OTHER:  PRIMARY: [employer] [place]      Policy#: [id number]      Subscriber: Pt. Approved 11/15 for admit 11/15 - 11/24 CM Name:   not stated    Phone#: [phone]     Fax#: [phone] Pre-Cert#: [id number]      Employer:  Benefits:  Phone #: [phone]     Name:  Eff. Date: 08/18/18     Deduct: $500 ($0 met)      Out of Pocket Max: $5000 ($0 met)       Life Max:  CIR: 70%      SNF: no coverage Outpatient: 70%     Co-Pay: 30% Home Health: 70%      Co-Pay: 30% DME: 70%     Co-Pay: 30% Providers:  SECONDARY:       Policy#:      Phone#:   Artist:       Phone#:   The "Data Collection Information Summary" for patients in Inpatient Rehabilitation Facilities with attached "Privacy Act Statement-Health Care Records" was provided and verbally reviewed with: Patient and Family  Emergency Contact Information Contact Information     Name Relation Home Work Mobile   Goodbar,Brian Spouse [phone]        Other Contacts     Name Relation Home Work Mobile   [place] Father   [phone]   Lenoard Aden   [phone]   Kashlee, Nevils   [phone]       Current Medical History  Patient Admitting Diagnosis: CVA   History of Present Illness: Pt is a 47 y/o female with PMH of HTN, tobacco use, DM, who presented to [facility] on 10/01/23 with c/o numbness of her R face and UE.  Also endorses numbness on RLE about 1 week prior to admit as well as R lean with gait.  In ED hemodynamically stable and labs unremarkable except sodium 132.  MRI showed acute infarct in the right midbrain.  CTA head/neck negative for LVO but showed moderate stenosis of P2 PCA.  Neurology consulted and recommended DAPT x3 weeks followed by  aspirin daily.  THerapy evaluations were completed and pt was recommended for CIR.  Complete NIHSS TOTAL: 0  Patient's medical record from [facility] has been reviewed by the rehabilitation admission coordinator and physician.  Past Medical History  Past Medical History:  Diagnosis Date   Abdominal bloating 05/26/2015   Breast nodule 03/31/2015   Dyspareunia 05/26/2015   Essential hypertension, benign 09/28/2019   HLD (hyperlipidemia) 09/28/2019   Hypothyroidism, adult 09/28/2019   Vitamin D deficiency disease 09/28/2019    Has the patient had major surgery during 100 days prior to admission? No  Family History   family history includes Cancer in her father, mother, and paternal grandfather; Diabetes in her maternal grandmother and mother; Hypertension in her maternal grandmother and mother.  Current Medications  Current Facility-Administered Medications:    acetaminophen (TYLENOL) tablet 650 mg, 650 mg, Oral, Q4H PRN **OR** acetaminophen (TYLENOL) 160 MG/5ML solution 650 mg, 650 mg, Per Tube, Q4H PRN **OR** acetaminophen (TYLENOL) suppository 650 mg, 650 mg, Rectal, Q4H PRN, Emokpae, Ejiroghene E, MD   amLODipine (NORVASC) tablet  5 mg, 5 mg, Oral, Daily, Tat, David, MD, 5 mg at 10/05/23 0816   aspirin EC tablet 81 mg, 81 mg, Oral, Daily, Emokpae, Ejiroghene E, MD, 81 mg at 10/05/23 0816   atorvastatin (LIPITOR) tablet 40 mg, 40 mg, Oral, Daily, Emokpae, Ejiroghene E, MD, 40 mg at 10/05/23 0816   clopidogrel (PLAVIX) tablet 75 mg, 75 mg, Oral, Daily, Emokpae, Ejiroghene E, MD, 75 mg at 10/05/23 0816   enoxaparin (LOVENOX) injection 40 mg, 40 mg, Subcutaneous, Q24H, Emokpae, Ejiroghene E, MD, 40 mg at 10/05/23 2012   hydrALAZINE (APRESOLINE) injection 10 mg, 10 mg, Intravenous, Q4H PRN, Emokpae, Ejiroghene E, MD   insulin aspart (novoLOG) injection 0-9 Units, 0-9 Units, Subcutaneous, TID WC, Tat, Onalee Hua, MD, 2 Units at 10/05/23 1725   Oral care mouth rinse, 15 mL, Mouth Rinse, PRN, Emokpae,  Ejiroghene E, MD   senna-docusate (Senokot-S) tablet 1 tablet, 1 tablet, Oral, QHS PRN, Emokpae, Ejiroghene E, MD, 1 tablet at 10/02/23 2039  Patients Current Diet:  Diet Order             Diet Heart Room service appropriate? Yes; Fluid consistency: Thin  Diet effective now                   Precautions / Restrictions Precautions Precautions: Fall Restrictions Weight Bearing Restrictions: No   Has the patient had 2 or more falls or a fall with injury in the past year? No  Prior Activity Level Community (5-7x/wk): independent, no DME used  Prior Functional Level Self Care: Did the patient need help bathing, dressing, using the toilet or eating? Independent  Indoor Mobility: Did the patient need assistance with walking from room to room (with or without device)? Independent  Stairs: Did the patient need assistance with internal or external stairs (with or without device)? Independent  Functional Cognition: Did the patient need help planning regular tasks such as shopping or remembering to take medications? Independent  Patient Information Are you of Hispanic, Latino/a,or Spanish origin?: A. No, not of Hispanic, Latino/a, or Spanish origin What is your race?: A. White Do you need or want an interpreter to communicate with a doctor or health care staff?: 0. No  Patient's Response To:  Health Literacy and Transportation Is the patient able to respond to health literacy and transportation needs?: Yes Health Literacy - How often do you need to have someone help you when you read instructions, pamphlets, or other written material from your doctor or pharmacy?: Never In the past 12 months, has lack of transportation kept you from medical appointments or from getting medications?: No In the past 12 months, has lack of transportation kept you from meetings, work, or from getting things needed for daily living?: No  Home Assistive Devices / Equipment Home Equipment: Clinical biochemist (2 wheels), Shower seat  Prior Device Use: Indicate devices/aids used by the patient prior to current illness, exacerbation or injury? None of the above  Current Functional Level Cognition  Overall Cognitive Status: Within Functional Limits for tasks assessed Orientation Level: Oriented X4 General Comments: pleasant and cooperative with therapy    Extremity Assessment (includes Sensation/Coordination)  Upper Extremity Assessment: Overall WFL for tasks assessed  Lower Extremity Assessment: Defer to PT evaluation RLE Deficits / Details: grossly 3+/5 RLE Sensation: WNL RLE Coordination: decreased gross motor    ADLs  Overall ADL's : Needs assistance/impaired Eating/Feeding: Modified independent, Sitting Grooming: Minimal assistance, Contact guard assist, Standing Upper Body Bathing: Modified independent, Sitting Lower Body Bathing: Set  up, Sitting/lateral leans Upper Body Dressing : Modified independent, Sitting Lower Body Dressing: Modified independent, Sitting/lateral leans Toilet Transfer: Minimal assistance, Contact guard assist, Stand-pivot, Rolling walker (2 wheels) Toilet Transfer Details (indicate cue type and reason): Simualted via EOB to chair transfer. Toileting- Clothing Manipulation and Hygiene: Set up, Sitting/lateral lean Tub/ Shower Transfer: Contact guard assist, Minimal assistance, Rolling walker (2 wheels) Functional mobility during ADLs: Contact guard assist, Minimal assistance, Rolling walker (2 wheels) General ADL Comments: Unsteady when in standing completin ADL's    Mobility  Overal bed mobility: Modified Independent General bed mobility comments: sitting at edge of bed on therapist arrival    Transfers  Overall transfer level: Needs assistance Equipment used: Rolling walker (2 wheels) Transfers: Sit to/from Stand Sit to Stand: Contact guard assist Bed to/from chair/wheelchair/BSC transfer type:: Step pivot Step pivot transfers: Contact guard  assist, Min assist General transfer comment: wide base of suppor having to lean on nearby objects for support when not using an AD    Ambulation / Gait / Stairs / Wheelchair Mobility  Ambulation/Gait Ambulation/Gait assistance: Min assist, Mod assist Gait Distance (Feet): 50 Feet Assistive device: Rolling walker (2 wheels) Gait Pattern/deviations: Decreased step length - right, Decreased step length - left, Decreased stride length, Antalgic, Ataxic, Wide base of support, Staggering right General Gait Details: ambulates with RW and CGA/min A; starts to list to the right as she fatigues Gait velocity: decreased    Posture / Balance Dynamic Sitting Balance Sitting balance - Comments: seated at EOB Balance Overall balance assessment: Needs assistance Sitting-balance support: Feet supported, No upper extremity supported Sitting balance-Leahy Scale: Good Sitting balance - Comments: seated at EOB Standing balance support: During functional activity, No upper extremity supported Standing balance-Leahy Scale: Poor Standing balance comment: fair with CGA    Special needs/care consideration Diabetic management yes   Previous Home Environment (from acute therapy documentation) Living Arrangements: Spouse/significant other Available Help at Discharge: Family, Other (Comment), Available 24 hours/day Type of Home: House Home Layout: Two level, Able to live on main level with bedroom/bathroom, Laundry or work area in basement Home Access: Stairs to enter Entrance Stairs-Rails: None Secretary/administrator of Steps: 2 Bathroom Shower/Tub: Engineer, manufacturing systems: Pharmacist, community: Yes Home Care Services: No  Discharge Living Setting Plans for Discharge Living Setting: Patient's home, Lives with (comment) (spouse) Type of Home at Discharge: House Discharge Home Layout: Able to live on main level with bedroom/bathroom Discharge Home Access: Stairs to enter Entrance  Stairs-Rails: None Entrance Stairs-Number of Steps: 2 Discharge Bathroom Shower/Tub: Tub/shower unit Discharge Bathroom Toilet: Standard Discharge Bathroom Accessibility: Yes How Accessible: Accessible via walker Does the patient have any problems obtaining your medications?: No  Social/Family/Support Systems Patient Roles: Spouse Anticipated Caregiver: Arlys John Anticipated Caregiver's Contact Information: [phone] Ability/Limitations of Caregiver: none stated Caregiver Availability: 24/7 Discharge Plan Discussed with Primary Caregiver: Yes Is Caregiver In Agreement with Plan?: Yes Does Caregiver/Family have Issues with Lodging/Transportation while Pt is in Rehab?: No  Goals Patient/Family Goal for Rehab: PT/OT/SLP supervision to mod I Expected length of stay: 9-12 days Additional Information: Discharge plan: return home at intermittent mod I level.  Arlys John can provide 24/7 supervision if needed Pt/Family Agrees to Admission and willing to participate: Yes Program Orientation Provided & Reviewed with Pt/Caregiver Including Roles  & Responsibilities: Yes  Decrease burden of Care through IP rehab admission: n/a  Possible need for SNF placement upon discharge: Not anticipated.  Pt with good progress, anticipated discharge home at intermittent mod I level  with spouse able to assist 24/7 if needed.   Patient Condition: I have reviewed medical records from [facility], spoken with CM, and patient and spouse. I discussed via phone for inpatient rehabilitation assessment.  Patient will benefit from ongoing PT, OT, and SLP, can actively participate in 3 hours of therapy a day 5 days of the week, and can make measurable gains during the admission.  Patient will also benefit from the coordinated team approach during an Inpatient Acute Rehabilitation admission.  The patient will receive intensive therapy as well as Rehabilitation physician, nursing, social worker, and care management interventions.   Due to bladder management, bowel management, safety, skin/wound care, disease management, medication administration, pain management, and patient education the patient requires 24 hour a day rehabilitation nursing.  The patient is currently min to mod assist with mobility and basic ADLs.  Discharge setting and therapy post discharge at home with home health is anticipated.  Patient has agreed to participate in the Acute Inpatient Rehabilitation Program and will admit today.  Preadmission Screen Completed By:  Stephania Fragmin, 10/05/2023 9:16 PM ______________________________________________________________________   Discussed status with Dr. Wynn Banker  on 10/06/23 at 930 and received approval for admission today.  Admission Coordinator:  Stephania Fragmin, PT, time 1050/Date 10/06/23   Assessment/Plan: Diagnosis:Right brainstem infarct Does the need for close, 24 hr/day Medical supervision in concert with the patient's rehab needs make it unreasonable for this patient to be served in a less intensive setting? Yes Co-Morbidities requiring supervision/potential complications: HTN Due to bladder management, bowel management, safety, skin/wound care, disease management, medication administration, pain management, and patient education, does the patient require 24 hr/day rehab nursing? Yes Does the patient require coordinated care of a physician, rehab nurse, PT, OT, and SLP to address physical and functional deficits in the context of the above medical diagnosis(es)? Yes Addressing deficits in the following areas: balance, endurance, locomotion, strength, transferring, bowel/bladder control, bathing, dressing, feeding, grooming, toileting, cognition, and psychosocial support Can the patient actively participate in an intensive therapy program of at least 3 hrs of therapy 5 days a week? Yes The potential for patient to make measurable gains while on inpatient rehab is good Anticipated functional outcomes  upon discharge from inpatient rehab: supervision PT, supervision OT, modified independent and supervision SLP Estimated rehab length of stay to reach the above functional goals is: 9-12d Anticipated discharge destination: Home 10. Overall Rehab/Functional Prognosis: good   MD Signature: Erick Colace M.D. [facility] Fellow Am Acad of [employer] Med and Rehab Diplomate Am Board of Electrodiagnostic Med Fellow Am Board of Interventional Pain

## 2023-10-06 ENCOUNTER — Other Ambulatory Visit: Payer: Self-pay

## 2023-10-06 ENCOUNTER — Encounter (HOSPITAL_COMMUNITY): Payer: Self-pay | Admitting: Physical Medicine and Rehabilitation

## 2023-10-06 ENCOUNTER — Inpatient Hospital Stay (HOSPITAL_COMMUNITY)
Admission: AD | Admit: 2023-10-06 | Discharge: 2023-10-09 | DRG: 057 | Disposition: A | Discharge status: Home / Self Care | Payer: BC Managed Care – PPO | Source: Other Acute Inpatient Hospital | Attending: Physical Medicine and Rehabilitation | Admitting: Physical Medicine and Rehabilitation

## 2023-10-06 DIAGNOSIS — E119 Type 2 diabetes mellitus without complications: Secondary | ICD-10-CM | POA: Diagnosis not present

## 2023-10-06 DIAGNOSIS — Z8249 Family history of ischemic heart disease and other diseases of the circulatory system: Secondary | ICD-10-CM

## 2023-10-06 DIAGNOSIS — Z794 Long term (current) use of insulin: Secondary | ICD-10-CM | POA: Diagnosis not present

## 2023-10-06 DIAGNOSIS — E1149 Type 2 diabetes mellitus with other diabetic neurological complication: Secondary | ICD-10-CM

## 2023-10-06 DIAGNOSIS — E785 Hyperlipidemia, unspecified: Secondary | ICD-10-CM | POA: Diagnosis not present

## 2023-10-06 DIAGNOSIS — I639 Cerebral infarction, unspecified: Secondary | ICD-10-CM | POA: Diagnosis not present

## 2023-10-06 DIAGNOSIS — I6389 Other cerebral infarction: Secondary | ICD-10-CM

## 2023-10-06 DIAGNOSIS — Z720 Tobacco use: Secondary | ICD-10-CM | POA: Diagnosis not present

## 2023-10-06 DIAGNOSIS — I10 Essential (primary) hypertension: Secondary | ICD-10-CM | POA: Diagnosis not present

## 2023-10-06 DIAGNOSIS — B9620 Unspecified Escherichia coli [E. coli] as the cause of diseases classified elsewhere: Secondary | ICD-10-CM | POA: Diagnosis not present

## 2023-10-06 DIAGNOSIS — E559 Vitamin D deficiency, unspecified: Secondary | ICD-10-CM | POA: Diagnosis not present

## 2023-10-06 DIAGNOSIS — N83202 Unspecified ovarian cyst, left side: Secondary | ICD-10-CM | POA: Diagnosis present

## 2023-10-06 DIAGNOSIS — R918 Other nonspecific abnormal finding of lung field: Secondary | ICD-10-CM | POA: Diagnosis present

## 2023-10-06 DIAGNOSIS — F17210 Nicotine dependence, cigarettes, uncomplicated: Secondary | ICD-10-CM | POA: Diagnosis not present

## 2023-10-06 DIAGNOSIS — G8310 Monoplegia of lower limb affecting unspecified side: Secondary | ICD-10-CM

## 2023-10-06 DIAGNOSIS — I69351 Hemiplegia and hemiparesis following cerebral infarction affecting right dominant side: Secondary | ICD-10-CM | POA: Diagnosis not present

## 2023-10-06 DIAGNOSIS — N390 Urinary tract infection, site not specified: Secondary | ICD-10-CM | POA: Diagnosis present

## 2023-10-06 DIAGNOSIS — Z801 Family history of malignant neoplasm of trachea, bronchus and lung: Secondary | ICD-10-CM

## 2023-10-06 DIAGNOSIS — E1165 Type 2 diabetes mellitus with hyperglycemia: Secondary | ICD-10-CM | POA: Diagnosis not present

## 2023-10-06 DIAGNOSIS — Z7902 Long term (current) use of antithrombotics/antiplatelets: Secondary | ICD-10-CM | POA: Diagnosis not present

## 2023-10-06 DIAGNOSIS — Z800 Family history of malignant neoplasm of digestive organs: Secondary | ICD-10-CM | POA: Diagnosis not present

## 2023-10-06 DIAGNOSIS — Z833 Family history of diabetes mellitus: Secondary | ICD-10-CM | POA: Diagnosis not present

## 2023-10-06 DIAGNOSIS — Z7984 Long term (current) use of oral hypoglycemic drugs: Secondary | ICD-10-CM

## 2023-10-06 DIAGNOSIS — Z79899 Other long term (current) drug therapy: Secondary | ICD-10-CM | POA: Diagnosis not present

## 2023-10-06 DIAGNOSIS — E278 Other specified disorders of adrenal gland: Secondary | ICD-10-CM | POA: Diagnosis present

## 2023-10-06 DIAGNOSIS — E039 Hypothyroidism, unspecified: Secondary | ICD-10-CM | POA: Diagnosis not present

## 2023-10-06 LAB — GLUCOSE, CAPILLARY
Glucose-Capillary: 174 mg/dL — ABNORMAL HIGH (ref 70–99)
Glucose-Capillary: 173 mg/dL — ABNORMAL HIGH (ref 70–99)
Glucose-Capillary: 262 mg/dL — ABNORMAL HIGH (ref 70–99)
Glucose-Capillary: 171 mg/dL — ABNORMAL HIGH (ref 70–99)
Glucose-Capillary: 278 mg/dL — ABNORMAL HIGH (ref 70–99)

## 2023-10-06 MED ORDER — AMLODIPINE BESYLATE 5 MG PO TABS
5.0000 mg | ORAL_TABLET | Freq: Every day | ORAL | Status: DC
  Administered 2023-10-07 – 2023-10-09 (×3): 5 mg via ORAL
  Filled 2023-10-06 (×3): qty 1

## 2023-10-06 MED ORDER — ACETAMINOPHEN 325 MG PO TABS: 325.0000 mg | ORAL_TABLET | ORAL | Status: DC | PRN

## 2023-10-06 MED ORDER — AMLODIPINE BESYLATE 5 MG PO TABS: 5.0000 mg | ORAL_TABLET | Freq: Every day | ORAL | Status: DC

## 2023-10-06 MED ORDER — GUAIFENESIN-DM 100-10 MG/5ML PO SYRP: 10.0000 mL | ORAL_SOLUTION | Freq: Four times a day (QID) | ORAL | Status: DC | PRN

## 2023-10-06 MED ORDER — METFORMIN HCL 500 MG PO TABS: 500.0000 mg | ORAL_TABLET | Freq: Two times a day (BID) | ORAL | Status: DC

## 2023-10-06 MED ORDER — BISACODYL 5 MG PO TBEC: 5.0000 mg | DELAYED_RELEASE_TABLET | Freq: Every day | ORAL | Status: DC | PRN

## 2023-10-06 MED ORDER — LOSARTAN POTASSIUM 50 MG PO TABS: 25.0000 mg | ORAL_TABLET | Freq: Every day | ORAL | Status: DC

## 2023-10-06 MED ORDER — FLEET ENEMA RE ENEM: 1.0000 | ENEMA | Freq: Once | RECTAL | Status: DC | PRN

## 2023-10-06 MED ORDER — ONDANSETRON HCL 4 MG/2ML IJ SOLN: 4.0000 mg | Freq: Four times a day (QID) | INTRAMUSCULAR | Status: DC | PRN

## 2023-10-06 MED ORDER — BLOOD PRESSURE CONTROL BOOK
Freq: Once | Status: AC
  Filled 2023-10-06: qty 1

## 2023-10-06 MED ORDER — MELATONIN 5 MG PO TABS: 5.0000 mg | ORAL_TABLET | Freq: Every evening | ORAL | Status: DC | PRN

## 2023-10-06 MED ORDER — POLYETHYLENE GLYCOL 3350 17 G PO PACK: 17.0000 g | PACK | Freq: Every day | ORAL | Status: DC | PRN

## 2023-10-06 MED ORDER — INSULIN ASPART 100 UNIT/ML IJ SOLN
0.0000 [IU] | Freq: Three times a day (TID) | INTRAMUSCULAR | Status: DC
  Administered 2023-10-06: 2 [IU] via SUBCUTANEOUS
  Administered 2023-10-07: 1 [IU] via SUBCUTANEOUS
  Administered 2023-10-07: 2 [IU] via SUBCUTANEOUS
  Administered 2023-10-07: 3 [IU] via SUBCUTANEOUS
  Administered 2023-10-08: 1 [IU] via SUBCUTANEOUS
  Administered 2023-10-08 (×2): 2 [IU] via SUBCUTANEOUS
  Administered 2023-10-09: 1 [IU] via SUBCUTANEOUS

## 2023-10-06 MED ORDER — ENOXAPARIN SODIUM 40 MG/0.4ML IJ SOSY
40.0000 mg | PREFILLED_SYRINGE | INTRAMUSCULAR | Status: DC
  Administered 2023-10-06 – 2023-10-08 (×3): 40 mg via SUBCUTANEOUS
  Filled 2023-10-06 (×3): qty 0.4

## 2023-10-06 MED ORDER — ATORVASTATIN CALCIUM 40 MG PO TABS: 40.0000 mg | ORAL_TABLET | Freq: Every day | ORAL | Status: DC

## 2023-10-06 MED ORDER — DIPHENHYDRAMINE HCL 25 MG PO CAPS: 25.0000 mg | ORAL_CAPSULE | Freq: Four times a day (QID) | ORAL | Status: DC | PRN

## 2023-10-06 MED ORDER — CLOPIDOGREL BISULFATE 75 MG PO TABS
75.0000 mg | ORAL_TABLET | Freq: Every day | ORAL | Status: DC
  Administered 2023-10-07 – 2023-10-09 (×3): 75 mg via ORAL
  Filled 2023-10-06 (×3): qty 1

## 2023-10-06 MED ORDER — ONDANSETRON HCL 4 MG PO TABS: 4.0000 mg | ORAL_TABLET | Freq: Four times a day (QID) | ORAL | Status: DC | PRN

## 2023-10-06 MED ORDER — LOSARTAN POTASSIUM 25 MG PO TABS
25.0000 mg | ORAL_TABLET | Freq: Every day | ORAL | Status: DC
  Administered 2023-10-07 – 2023-10-09 (×3): 25 mg via ORAL
  Filled 2023-10-06 (×3): qty 1

## 2023-10-06 MED ORDER — ASPIRIN 81 MG PO TBEC: 81.0000 mg | DELAYED_RELEASE_TABLET | Freq: Every day | ORAL | Status: DC

## 2023-10-06 MED ORDER — LIVING WELL WITH DIABETES BOOK
Freq: Once | Status: AC
  Filled 2023-10-06: qty 1

## 2023-10-06 MED ORDER — LOSARTAN POTASSIUM 25 MG PO TABS: 25.0000 mg | ORAL_TABLET | Freq: Every day | ORAL | Status: DC

## 2023-10-06 MED ORDER — ATORVASTATIN CALCIUM 40 MG PO TABS
40.0000 mg | ORAL_TABLET | Freq: Every day | ORAL | Status: DC
  Administered 2023-10-07 – 2023-10-09 (×3): 40 mg via ORAL
  Filled 2023-10-06 (×3): qty 1

## 2023-10-06 MED ORDER — METHOCARBAMOL 500 MG PO TABS: 500.0000 mg | ORAL_TABLET | Freq: Four times a day (QID) | ORAL | Status: DC | PRN

## 2023-10-06 MED ORDER — ALUM & MAG HYDROXIDE-SIMETH 200-200-20 MG/5ML PO SUSP: 30.0000 mL | ORAL | Status: DC | PRN

## 2023-10-06 MED ORDER — ENOXAPARIN SODIUM 40 MG/0.4ML IJ SOSY: 40.0000 mg | PREFILLED_SYRINGE | INTRAMUSCULAR | Status: DC

## 2023-10-06 MED ORDER — ASPIRIN 81 MG PO TBEC
81.0000 mg | DELAYED_RELEASE_TABLET | Freq: Every day | ORAL | Status: DC
  Administered 2023-10-07 – 2023-10-09 (×3): 81 mg via ORAL
  Filled 2023-10-06 (×3): qty 1

## 2023-10-06 MED ORDER — CLOPIDOGREL BISULFATE 75 MG PO TABS: 75.0000 mg | ORAL_TABLET | Freq: Every day | ORAL | Status: DC

## 2023-10-06 NOTE — H&P (Signed)
Physical Medicine and Rehabilitation Admission H&P     CC: Functional deficits secondary to acute infarct right mid-brain   HPI: Melissa Wilkerson is a 47 year old female who presented to the emergency department [facility] on 10/01/2023.  She complained of right facial and right hand numbness, dizziness and right sided lean.  Past medical history is significant for hyperlipidemia, hypertension, hypothyroidism and pre-diabetes mellitus.  Imaging orders obtained and neurology consulted.  She was outside the window for tenecteplase.  CTA head and neck without emergent large vessel occlusion.  To severe stenosis of the P2 PCA.  MRI of the brain revealed acute infarct in the right midbrain.  She was started on aspirin 81 mg daily Plavix 75 mg daily for 3 weeks to be followed by aspirin alone.  Hemoglobin A1c 7.4%.  Ejection fraction 65 to 70%.  CT of abdomen chest and pelvis performed due to patient's young age and lung nodules noted and recommend outpatient surveillance.  Also noted was a left ovarian cystic lesion and pelvic ultrasound performed.  GYN follow-up arranged.  Left adrenal nodule noted and recommend outpatient MRI.  Symptoms have improved and she has tolerated heart healthy diet.  He required min assist to stand and ambulate with rolling walker.The patient requires inpatient medicine and rehabilitation evaluations and services for ongoing dysfunction secondary to right midbrain infarct.   Review of Systems  Constitutional:  Negative for chills and fever.  HENT:  Negative for congestion and sore throat.   Eyes:  Negative for blurred vision and double vision.  Respiratory:  Negative for cough and shortness of breath.   Cardiovascular:  Negative for chest pain and palpitations.  Gastrointestinal:  Negative for nausea and vomiting.  Genitourinary:  Negative for dysuria and urgency.  Musculoskeletal:  Negative for back pain and neck pain.  Neurological:  Positive for dizziness, sensory  change and focal weakness.       Mild numbness right side of face; seems to be improving. She feels a sense of right-sided weakness/imbalance  Psychiatric/Behavioral:  Negative for memory loss. The patient is not nervous/anxious.         Past Medical History:  Diagnosis Date   Abdominal bloating 05/26/2015   Breast nodule 03/31/2015   Dyspareunia 05/26/2015   Essential hypertension, benign 09/28/2019   HLD (hyperlipidemia) 09/28/2019   Hypothyroidism, adult 09/28/2019   Vitamin D deficiency disease 09/28/2019        History reviewed. No pertinent surgical history.          Family History  Problem Relation Age of Onset   Cancer Mother          lung   Diabetes Mother     Hypertension Mother     Cancer Father          throat, tongue   Diabetes Maternal Grandmother     Hypertension Maternal Grandmother     Cancer Paternal Grandfather          Social History:  reports that she has been smoking cigarettes. She has a 17 pack-year smoking history. She has never used smokeless tobacco. She reports that she does not drink alcohol and does not use drugs. Allergies:  Allergies  No Known Allergies         Medications Prior to Admission  Medication Sig Dispense Refill   acetaminophen (TYLENOL) 500 MG tablet Take 500 mg by mouth every 6 (six) hours as needed for mild pain (pain score 1-3).  Home: Home Living Family/patient expects to be discharged to:: Private residence Living Arrangements: Spouse/significant other Available Help at Discharge: Family, Other (Comment), Available 24 hours/day Type of Home: House Home Access: Stairs to enter [employer] of Steps: 2 Entrance Stairs-Rails: None Home Layout: Two level, Able to live on main level with bedroom/bathroom, Laundry or work area in basement [employer] Shower/Tub: Engineer, manufacturing systems: Standard Bathroom Accessibility: Yes Home Equipment: Agricultural consultant (2 wheels), Forensic scientist History: Prior Function Prior Level of Function : Independent/Modified Independent Mobility Comments: Independent community ambulator without AD, drives, shops ADLs Comments: Independent   Functional Status:  Mobility: Bed Mobility Overal bed mobility: Modified Independent General bed mobility comments: sitting at edge of bed on therapist arrival Transfers Overall transfer level: Needs assistance Equipment used: Rolling walker (2 wheels) Transfers: Sit to/from Stand Sit to Stand: Contact guard assist Bed to/from chair/wheelchair/BSC transfer type:: Step pivot Step pivot transfers: Contact guard assist, Min assist General transfer comment: wide base of suppor having to lean on nearby objects for support when not using an AD Ambulation/Gait Ambulation/Gait assistance: Min assist, Mod assist Gait Distance (Feet): 50 Feet Assistive device: Rolling walker (2 wheels) Gait Pattern/deviations: Decreased step length - right, Decreased step length - left, Decreased stride length, Antalgic, Ataxic, Wide base of support, Staggering right General Gait Details: ambulates with RW and CGA/min A; starts to list to the right as she fatigues Gait velocity: decreased   ADL: ADL Overall ADL's : Needs assistance/impaired Eating/Feeding: Modified independent, Sitting Grooming: Minimal assistance, Contact guard assist, Standing Upper Body Bathing: Modified independent, Sitting Lower Body Bathing: Set up, Sitting/lateral leans Upper Body Dressing : Modified independent, Sitting Lower Body Dressing: Modified independent, Sitting/lateral leans Toilet Transfer: Minimal assistance, Contact guard assist, Stand-pivot, Rolling walker (2 wheels) Toilet Transfer Details (indicate cue type and reason): Simualted via EOB to chair transfer. Toileting- Clothing Manipulation and Hygiene: Set up, Sitting/lateral lean Tub/ Shower Transfer: Contact guard assist, Minimal assistance, Rolling walker (2  wheels) Functional mobility during ADLs: Contact guard assist, Minimal assistance, Rolling walker (2 wheels) General ADL Comments: Unsteady when in standing completin ADL's   Cognition: Cognition Overall Cognitive Status: Within Functional Limits for tasks assessed Orientation Level: Oriented X4 Cognition Arousal: Alert Behavior During Therapy: WFL for tasks assessed/performed Overall Cognitive Status: Within Functional Limits for tasks assessed General Comments: pleasant and cooperative with therapy   Physical Exam: Blood pressure (!) 151/74, pulse 73, temperature 98.3 F (36.8 C), resp. rate 18, height 5\' 2"  (1.575 m), weight 79.4 kg, SpO2 97%. Physical Exam Constitutional:      General: She is not in acute distress. HENT:     Head: Normocephalic and atraumatic.  Cardiovascular:     Rate and Rhythm: Normal rate and regular rhythm.  Pulmonary:     Effort: Pulmonary effort is normal.     Breath sounds: Normal breath sounds.  Abdominal:     General: Bowel sounds are normal.     Palpations: Abdomen is soft.  Skin:    General: Skin is warm and dry.  Neurological:     Mental Status: She is alert and oriented to person, place, and time.  Psychiatric:        Mood and Affect: Mood normal.        Behavior: Behavior normal.    Neuro:  Eyes without evidence of nystagmus  Tone is normal without evidence of spasticity Cerebellar exam shows no evidence of ataxia on finger nose finger or heel to shin testing  No evidence of trunkal ataxia  Motor strength is 5/5 in bilateral deltoid, biceps, triceps, finger flexors and extensors, wrist flexors and extensors,LEFT hip flexors, knee flexors and extensors, ankle dorsiflexors, plantar flexors, invertors and evertors, toe flexors and extensors 4-/5 Right HF, KE, ADF APF invertors and evertors  Sensory exam is normal to pinprick, proprioception and light touch in the upper and lower limbs   Cranial nerves II- Visual fields are intact to  confrontation testing, no blurring of vision III- no evidence of ptosis, upward, downward and medial gaze intact IV- no vertical diplopia or head tilt V- no facial numbness or masseter weakness VI- no pupil abduction weakness VII- no facial droop, good lid closure VII- normal auditory acuity IX- no pharygeal weakness, gag nl X- no pharyngeal weakness, no hoarseness XI- no trap or SCM weakness XII- no glossal weakness      Lab Results Last 48 Hours        Results for orders placed or performed during the hospital encounter of 10/01/23 (from the past 48 hour(s))  Glucose, capillary     Status: Abnormal    Collection Time: 10/04/23 11:21 AM  Result Value Ref Range    Glucose-Capillary 229 (H) 70 - 99 mg/dL      Comment: Glucose reference range applies only to samples taken after fasting for at least 8 hours.  Glucose, capillary     Status: Abnormal    Collection Time: 10/04/23  4:50 PM  Result Value Ref Range    Glucose-Capillary 156 (H) 70 - 99 mg/dL      Comment: Glucose reference range applies only to samples taken after fasting for at least 8 hours.  Glucose, capillary     Status: Abnormal    Collection Time: 10/04/23  9:36 PM  Result Value Ref Range    Glucose-Capillary 182 (H) 70 - 99 mg/dL      Comment: Glucose reference range applies only to samples taken after fasting for at least 8 hours.    Comment 1 Notify RN      Comment 2 Document in Chart    Glucose, capillary     Status: Abnormal    Collection Time: 10/05/23  7:15 AM  Result Value Ref Range    Glucose-Capillary 157 (H) 70 - 99 mg/dL      Comment: Glucose reference range applies only to samples taken after fasting for at least 8 hours.  Glucose, capillary     Status: Abnormal    Collection Time: 10/05/23 11:08 AM  Result Value Ref Range    Glucose-Capillary 275 (H) 70 - 99 mg/dL      Comment: Glucose reference range applies only to samples taken after fasting for at least 8 hours.  Glucose, capillary      Status: Abnormal    Collection Time: 10/05/23  4:23 PM  Result Value Ref Range    Glucose-Capillary 155 (H) 70 - 99 mg/dL      Comment: Glucose reference range applies only to samples taken after fasting for at least 8 hours.    Comment 1 Notify RN      Comment 2 Document in Chart    Glucose, capillary     Status: Abnormal    Collection Time: 10/05/23  8:16 PM  Result Value Ref Range    Glucose-Capillary 149 (H) 70 - 99 mg/dL      Comment: Glucose reference range applies only to samples taken after fasting for at least 8 hours.  Glucose, capillary  Status: Abnormal    Collection Time: 10/06/23  5:37 AM  Result Value Ref Range    Glucose-Capillary 174 (H) 70 - 99 mg/dL      Comment: Glucose reference range applies only to samples taken after fasting for at least 8 hours.  Glucose, capillary     Status: Abnormal    Collection Time: 10/06/23  8:00 AM  Result Value Ref Range    Glucose-Capillary 173 (H) 70 - 99 mg/dL      Comment: Glucose reference range applies only to samples taken after fasting for at least 8 hours.  Glucose, capillary     Status: Abnormal    Collection Time: 10/06/23 11:05 AM  Result Value Ref Range    Glucose-Capillary 262 (H) 70 - 99 mg/dL      Comment: Glucose reference range applies only to samples taken after fasting for at least 8 hours.      Imaging Results (Last 48 hours)  No results found.         Blood pressure (!) 151/74, pulse 73, temperature 98.3 F (36.8 C), resp. rate 18, height 5\' 2"  (1.575 m), weight 79.4 kg, SpO2 97%.   Medical Problem List and Plan: 1. Functional deficits secondary to right brainstem infarct 10/01/23             -patient may  shower             -ELOS/Goals: 9-12d   2.  Antithrombotics: -DVT/anticoagulation:  Pharmaceutical: Lovenox             -antiplatelet therapy: Aspirin and Plavix for three weeks followed by aspirin alone   3. Pain Management: Tylenol as needed   4. Mood/Behavior/Sleep: LCSW to evaluate and  provide emotional support             -antipsychotic agents: n/a   5. Neuropsych/cognition: This patient is capable of making decisions on her own behalf.   6. Skin/Wound Care: Routine skin care checks   7. Fluids/Electrolytes/Nutrition: Routine Is and Os and follow-up chemistries   8: Hypertension: monitor TID and prn             -continue amlodipine 5 mg daily             -continue losartan 25 mg daily   9: Hyperlipidemia: continue statin   10: DM-2: CBGs x QID; A1c = 7.4% (diet controlled at home)             -continue SSI   11: Tobacco use: Cessation counseling   Milinda Antis, PA-C 10/06/2023  "I have personally performed a face to face diagnostic evaluation of this patient.  Additionally, I have reviewed and concur with the physician assistant's documentation above."   Erick Colace M.D. [facility] Fellow Am Acad of Phys Med and Rehab Diplomate Am Board of Electrodiagnostic Med Fellow Am Board of Interventional Pain

## 2023-10-06 NOTE — Progress Notes (Signed)
Inpatient Rehabilitation Admission Medication Review by a Pharmacist  A complete drug regimen review was completed for this patient to identify any potential clinically significant medication issues.  High Risk Drug Classes Is patient taking? Indication by Medication  Antipsychotic No   Anticoagulant Yes Enoxaparin - VTE prophylaxis  Antibiotic No   Opioid No   Antiplatelet Yes Asprin 81 mg and clopidogrel for 3 weeks, then Aspirin alone  - stroke prophylaxis  Hypoglycemics/insulin Yes SSI - glucose control  Vasoactive Medication No Amlodipine, losartan - hypertension  Chemotherapy No   Other Yes Atorvastatin - hyperlipidemia  PRNs: Acetaminophen - mild pain Maalox - indigestion Guaifenesin/dextromethorphan - cough Melatonin - sleep Methocarbamol - muscle spasms Ondanestron - nausea, vomiting Bisacodyl, Miralax, Fleets enema - constipation     Type of Medication Issue Identified Description of Issue Recommendation(s)  Drug Interaction(s) (clinically significant)     Duplicate Therapy     Allergy     No Medication Administration End Date     Incorrect Dose     Additional Drug Therapy Needed     Significant med changes from prior encounter (inform family/care partners about these prior to discharge). Only taking PRN acetaminophen prior to admission. All meds are new.  Communicate changes with patient/family prior to discharge.  Other  Discharge summary noted plan to begin Metformin 500 mg BID with meals. SSI for now; begin Metformin at discharge, or while on CIR if indicated.    Clinically significant medication issues were identified that warrant physician communication and completion of prescribed/recommended actions by midnight of the next day:  No  Pharmacist comments:  - Aspirin 81 mg and Clopidogrel for 3 weeks, then Aspirin alone. Stop time in place for   Time spent performing this drug regimen review (minutes):  [address]   Dennie Fetters, [place] 10/06/2023  5:25 PM

## 2023-10-06 NOTE — Discharge Summary (Signed)
Physician Discharge Summary  Patient ID: Ethelean Bianca MRN: [id number] DOB/AGE: May 08, 1976 47 y.o.  Admit date: 10/06/2023 Discharge date: TBD  Discharge Diagnoses:  Principal Problem:   CVA (cerebral vascular accident) [facility]) Active problems: Debility secondary to CVA Hypertension Hyperlipidemia Diabetes mellitus type 2 Tobacco use Bilateral pulmonary lung nodules Left ovarian cyst Left adrenal nodule  Discharged Condition: {condition:18240}  Significant Diagnostic Studies:  Labs:  Basic Metabolic Panel: Recent Labs  Lab 10/01/23 1456 10/04/23 0459  NA 132* 136  K 3.6 3.7  CL 100 101  CO2 22 23  GLUCOSE 233* 157*  BUN 15 21*  CREATININE 0.57 0.58  CALCIUM 8.6* 9.1  MG 1.9 2.0    CBC: Recent Labs  Lab 10/01/23 1456  WBC 9.6  NEUTROABS 7.5  HGB 15.2*  HCT 44.0  MCV 91.7  PLT 243    CBG: Recent Labs  Lab 10/05/23 1623 10/05/23 2016 10/06/23 0537 10/06/23 0800 10/06/23 1105  GLUCAP 155* 149* 174* 173* 262*    Brief HPI:   Betselot Cowper is a 47 y.o. female who presented to the emergency department [facility] on 10/01/2023. She complained of right facial and right hand numbness, dizziness and right sided lean. Past medical history is significant for hyperlipidemia, hypertension, hypothyroidism and pre-diabetes mellitus. Imaging orders obtained and neurology consulted. She was outside the window for tenecteplase. CTA head and neck without emergent large vessel occlusion. To severe stenosis of the P2 PCA. MRI of the brain revealed acute infarct in the right midbrain. She was started on aspirin 81 mg daily Plavix 75 mg daily for 3 weeks to be followed by aspirin alone. Hemoglobin A1c 7.4%. Ejection fraction 65 to 70%. CT of abdomen chest and pelvis performed due to patient's young age and lung nodules noted and recommend outpatient surveillance. Also noted was a left ovarian cystic lesion and pelvic ultrasound performed. GYN follow-up arranged. Left  adrenal nodule noted and recommend outpatient MRI. Symptoms have improved and she has tolerated heart healthy diet. He required min assist to stand and ambulate with rolling walker.    Hospital Course: Norely Spoerl was admitted to rehab 10/06/2023 for inpatient therapies to consist of PT, ST and OT at least three hours five days a week. Past admission physiatrist, therapy team and rehab RN have worked together to provide customized collaborative inpatient rehab.   Blood pressures were monitored on TID basis and she remained on amlodipine 5 mg daily and losartan 25 mg daily.  Diabetes has been monitored with ac/hs CBG checks and SSI was use prn for tighter BS control.    Rehab course: During patient's stay in rehab weekly team conferences were held to monitor patient's progress, set goals and discuss barriers to discharge. At admission, patient required  She has had improvement in activity tolerance, balance, postural control as well as ability to compensate for deficits. She has had improvement in functional use RUE/LUE  and RLE/LLE as well as improvement in awareness       Disposition:  There are no questions and answers to display.         Diet:  Special Instructions:   Allergies as of 10/06/2023   No Known Allergies   Med Rec must be completed prior to using this [facility]***       Follow-up Information     Raulkar, Drema Pry, MD Follow up.   Specialty: Physical Medicine and Rehabilitation Contact information: [address] [place] [address] [phone]  [facility] Follow up.   Contact information: [address]     [address] [place] [address] [phone]                Signed: Milinda Antis 10/06/2023, 4:45 PM

## 2023-10-06 NOTE — Progress Notes (Signed)
Inpatient Rehab Admissions Coordinator:    I have a CIR bed for this Pt. RN may call report to [phone].   Pt. To admit to CIR to participate in intensive rehab for an estimated 10-12 days with the goal of discharging home with assist from her husband and son.    Megan Salon, MS, CCC-SLP Rehab Admissions Coordinator  [phone] (celll) [phone] (office)

## 2023-10-06 NOTE — Progress Notes (Signed)
Mobility Specialist Progress Note:    10/06/23 1032  Mobility  Activity Ambulated with assistance in hallway  Level of Assistance Minimal assist, patient does 75% or more  Assistive Device Front wheel walker  Distance Ambulated (ft) 45 ft  Range of Motion/Exercises Active;All extremities  Activity Response Tolerated well  Mobility Referral Yes  $Mobility charge 1 Mobility  Mobility Specialist Start Time (ACUTE ONLY) 1010  Mobility Specialist Stop Time (ACUTE ONLY) 1025  Mobility Specialist Time Calculation (min) (ACUTE ONLY) 15 min   Pt received in bed, husband in room. Agreeable to mobility, required MinA to stand and ambulate with RW. Tolerated well, RLE weakness. Returned pt to room, left sitting EOB. All needs met.   Lawerance Bach Mobility Specialist Please contact via Special educational needs teacher or  Rehab office at [phone]

## 2023-10-06 NOTE — Discharge Summary (Addendum)
Physician Discharge Summary   Patient: Melissa Wilkerson MRN: [id number] DOB: September 12, 1976  Admit date:     10/01/2023  Discharge date: 10/06/23  Discharge Physician: Onalee Hua Kandice Schmelter   PCP: Pcp, No   Recommendations at discharge:   Please follow up with primary care provider within 1-2 weeks  Please repeat BMP and CBC in one week     Hospital Course: 47 y/o female with a history of hypertension, tobacco abuse, and diabetes presenting with numbness on her right face and right hand up to her elbow that began at 7 AM on 10/01/2023.  The patient states that she had some numbness in her right foot up to her right knee about a week prior to this admission lasting " a few minutes".  She states that on and off for the past week she has noted that she has been " leaning to the right" with ambulation.  However she denies any frank leg weakness or falling.  She has had some dizziness but denies any visual disturbance, syncope, chest pain, shortness of breath.  She has not had any fevers, chills, nausea, vomiting or diarrhea, abdominal pain.  Spouse reported that the patient speech has been slow, but there has been no dysarthria.  Because of her right-sided numbness and dizziness, the patient presented for further evaluation and treatment. In the ED, the patient was afebrile and hemodynamically stable with oxygen saturation 99% room air.  WBC 9.6, hemoglobin 15.2, platelets 243.  Sodium 132, potassium 3.6, bicarbonate 22, serum creatinine 0.57.  LFTs unremarkable.  UA was negative for pyuria.  Urine drug screen was negative.  MRI brain showed an acute infarct in the right midbrain.  CTA head and neck was negative for LVO.  Showed moderate to severe stenosis of the P2 PCA.  The patient was started on aspirin and Plavix.  She was admitted for further evaluation and treatment of her stroke. Neurology was consulted and recommended DAPT x 3 weeks followed by ASA 81 mg  daily.  CT CAP was ordered by neurology with results  discussed below.  PT recommended CIR with which patient agreed.  Assessment and Plan:  Acute ischemic stroke -Appreciate Neurology Consult>>hypercoagulable work up ordered as well as CT CAP to look for occult malignancy -PT/OT evaluation>>CIR -Speech therapy eval appreciated -MRI brain--acute infarct right medulla -CTA H&N--negative LVO.  Moderate severe stenosis P2 PCA, moderate bilateral paraclinoid ICA stenosis.  Severe right vertebral artery stenosis -Echo--EF 65-70%, not optimal to eval WMA, no PFO, trivial MR -LDL--unable to calculate secondary to elevated triglycerides -HbA1C--7.4 -Antiplatelet--ASA 81 mg and plavix 75 mg daily x 3 weeks, then ASA 81 mg daily -plan 16 more days of DAPT starting 10/07/23 and then ASA 81 mg daily -discussed +lupus anticoagulant with Dr. Azzie Roup change in therapy for now.  Keep outpt follow up with neurology after d/c   Essential hypertension -not on any medications as an outpatient -Allowing for permissive hypertension temporarily -Plan to start antihypertensive medications at time of discharge>>started amlodipine and losartan   Mixed hyperlipidemia -Unable to calculate LDL secondary to elevated triglycerides -Triglycerides 481 -Total cholesterol 241 -direct LDL 164 -started lipitor 40 mg daily   Impaired glucose tolerance>>new diagnosis DM2 with hyperglycemia -Hemoglobin A1c 7.4 -03/03/2021 hemoglobin A1c 6.2 -start novolog sliding scale -start metformin after d/c -needs PCP follow up   Tobacco abuse -Tobacco cessation discussed   Left lung nodules/R-lung nodule -noted on CT chest -will need outpatient surveillance -pt and spouse updated   Left ovarian cystic lesion -11/15 pelvic  ultrasound--normal ovarian follicles bilateral--no masses -outpatient GYN follow up--I've set up with Dr. Despina Hidden on 10/22/13 at 11AM   Left adrenal nodule -outpatient MR and surveillance        Consultants: CIR Procedures performed: none   Disposition: Home Diet recommendation:  Cardiac and Carb modified diet DISCHARGE MEDICATION: Allergies as of 10/06/2023   No Known Allergies      Medication List     TAKE these medications    acetaminophen 500 MG tablet Commonly known as: TYLENOL Take 500 mg by mouth every 6 (six) hours as needed for mild pain (pain score 1-3).   amLODipine 5 MG tablet Commonly known as: NORVASC Take 1 tablet (5 mg total) by mouth daily. Start taking on: October 07, 2023   aspirin EC 81 MG tablet Take 1 tablet (81 mg total) by mouth daily. Swallow whole. Start taking on: October 07, 2023   atorvastatin 40 MG tablet Commonly known as: LIPITOR Take 1 tablet (40 mg total) by mouth daily. Start taking on: October 07, 2023   clopidogrel 75 MG tablet Commonly known as: PLAVIX Take 1 tablet (75 mg total) by mouth daily. X 16 more days Start taking on: October 07, 2023   losartan 25 MG tablet Commonly known as: COZAAR Take 1 tablet (25 mg total) by mouth daily. Start taking on: October 07, 2023   metFORMIN 500 MG tablet Commonly known as: GLUCOPHAGE Take 1 tablet (500 mg total) by mouth 2 (two) times daily with a meal.               Durable Medical Equipment  (From admission, onward)           Start     Ordered   10/03/23 1007  For home use only DME Walker rolling  Once       Comments: Patient unsteady and at high risk for falls walking without AD, safer using RW with good return for use demonstrated  Question Answer Comment  Walker: With 5 Inch Wheels   Patient needs a walker to treat with the following condition Gait difficulty      10/03/23 1006            Discharge Exam: Filed Weights   10/01/23 1420  Weight: 79.4 kg     Condition at discharge: stable  The results of significant diagnostics from this hospitalization (including imaging, microbiology, ancillary and laboratory) are listed below for reference.   Imaging Studies: US PELVIC COMPLETE  WITH TRANSVAGINAL  Result Date: 10/03/2023 CLINICAL DATA:  Left ovarian cyst on CT yesterday EXAM: TRANSABDOMINAL AND TRANSVAGINAL ULTRASOUND OF PELVIS TECHNIQUE: Both transabdominal and transvaginal ultrasound examinations of the pelvis were performed. Transabdominal technique was performed for global imaging of the pelvis including uterus, ovaries, adnexal regions, and pelvic cul-de-sac. It was necessary to proceed with endovaginal exam following the transabdominal exam to visualize the 10/02/2023. COMPARISON:  10/02/2023 FINDINGS: Uterus Measurements: 8.6 x 4.5 x 5.7 cm = volume: 115.5 mL. No fibroids or other mass visualized. Endometrium Thickness: 10 mm.  No focal abnormality visualized. Right ovary Measurements: 2.8 x 1.6 x 1.8 cm = volume: 4.1 mL. Normal appearance/no adnexal mass. Left ovary Measurements: 3.1 x 1.8 by 2.3 cm = volume: 6.5 mL. Normal follicles are seen within the left ovary, largest measuring 1.8 cm. No adnexal masses. Other findings Trace pelvic free fluid within the cul-de-sac and left adnexa, likely physiologic. IMPRESSION: 1. Normal appearing follicles within the left ovary. No adnexal masses. 2. Trace pelvic free fluid,  likely physiologic. Electronically Signed   By: Sharlet Salina M.D.   On: 10/03/2023 16:42   CT CHEST ABDOMEN PELVIS W CONTRAST  Result Date: 10/02/2023 CLINICAL DATA:  Inpatient encounter with suspected occult malignancy. 47 year old female. Menopausal status is not known. EXAM: CT CHEST, ABDOMEN, AND PELVIS WITH CONTRAST TECHNIQUE: Multidetector CT imaging of the chest, abdomen and pelvis was performed following the standard protocol during bolus administration of intravenous contrast. RADIATION DOSE REDUCTION: This exam was performed according to the departmental dose-optimization program which includes automated exposure control, adjustment of the mA and/or kV according to patient size and/or use of iterative reconstruction technique. CONTRAST:   OMNIPAQUE IOHEXOL 300 MG/ML SOLN, 30mL OMNIPAQUE IOHEXOL 300 MG/ML SOLN COMPARISON:  No relevant prior study. No prior body CT for comparison. FINDINGS: CT CHEST FINDINGS Cardiovascular: There is mild cardiomegaly. There is no pericardial effusion. There are calcific plaques in the distal aortic arch and aortic annulus, but no visible coronary calcifications. The great vessels are patent. There is no aortic or great vessel stenosis, dissection or aneurysm. There are nonstenosing soft plaques at the origins of the brachiocephalic and left common carotid arteries. Pulmonary veins and arteries are normal caliber. The pulmonary arteries are centrally clear. Mediastinum/Nodes: No thyroid nodule is seen. There are several bilateral borderline sized axillary space lymph nodes, largest on the right is 9 mm in short axis. Largest on the left is 8 mm in short axis. There are 2 minimally prominent left hilar lymph nodes each measuring 10 mm in short axis. No other intrathoracic adenopathy is seen. There are shotty subcentimeter scattered mediastinal nodes which are not pathologic by size criteria. The thoracic trachea, main bronchi and thoracic esophagus unremarkable. Lungs/Pleura: No pleural effusion, thickening or pneumothorax. There is asymmetric posterior subpleural atelectasis in the left lower lobe. Faint hazy left lower lobe infrahilar opacities are noted and could be due to mosaic changes from air trapping and small airway disease or could indicate a minimal pneumonitis. Also could be ground-glass scarring. Three-month follow-up chest CT recommended to ensure clearing or stability. There are mild features of paraseptal emphysema in the upper lobes. Mild central bronchial thickening bilaterally. There is a 5 mm left upper lobe nodule on 3:62, a 3 mm left upper lobe nodule on 3:52, and a 3 mm right middle lobe nodule on 3:81. The lungs are otherwise clear. Musculoskeletal: There is degenerative disc disease and  spondylosis with slight dextroscoliosis of the thoracic spine. No regional suspicious bone lesion is seen. No chest wall mass is evident. CT ABDOMEN PELVIS FINDINGS Hepatobiliary: Enlarged, mildly steatotic, 23 cm length. No mass enhancement. The hepatic portal main vein is normal caliber. Unremarkable gallbladder and bile ducts. Pancreas: No abnormality. Spleen: Enlarged measuring 16 cm in length.  No mass. Adrenals/Urinary Tract: There is no right adrenal mass. In the medial limb of left adrenal gland, there is a 1.3 cm nodule, Hounsfield density ranges from 50-63. Chemical shift MRI recommended. There is a 1 cm hypodense lesion in the outer midpole left kidney, Hounsfield density is 37. Indeterminate due to density. Attention on MRI recommended. The bilateral kidneys are otherwise unremarkable with symmetric excretion in the delayed phase. There are no urinary stone or obstruction. Bladder is unremarkable for the degree of distention. Stomach/Bowel: No dilatation or wall thickening including of the retrocecal appendix. There is sigmoid diverticulosis without evidence of focal colitis or diverticulitis. Vascular/Lymphatic: Somewhat age-advanced abdominal aortoiliac atherosclerosis. No enlarged abdominal or pelvic lymph nodes. Reproductive: 2.8 cm left ovarian cystic lesion,  Hounsfield density is 21 with somewhat irregular appearance to the wall. Pelvic ultrasound recommended. The uterus and adnexal structures are otherwise unremarkable. Other: No free fluid, free air or incarcerated hernia. Musculoskeletal: No acute or significant osseous findings. IMPRESSION: 1. 2.8 cm left ovarian cystic lesion with somewhat irregular appearance to the wall. Pelvic ultrasound recommended. 2. 1.3 cm left adrenal indeterminate nodule. Chemical shift MRI recommended. 3. 1 cm indeterminate hypodense lesion in the outer midpole left kidney. Attention on MRI recommended. 4. Mild cardiomegaly. 5. Aortoiliac atherosclerosis. 6.  Emphysema and bronchitis. 7. Faint hazy left lower lobe infrahilar opacities which could be due to mosaicism from air trapping and small airway disease, pneumonitis or ground-glass scarring. Three-month follow-up chest CT recommended to ensure clearing or stability. 8. 5 mm and two 3 mm pulmonary nodules. Attention on follow-up recommended. 9. Hepatosplenomegaly with mild hepatic steatosis. 10. Diverticulosis without evidence of diverticulitis. Aortic Atherosclerosis (ICD10-I70.0) and Emphysema (ICD10-J43.9). Electronically Signed   By: Almira Bar M.D.   On: 10/02/2023 21:59   ECHOCARDIOGRAM COMPLETE  Result Date: 10/02/2023    ECHOCARDIOGRAM REPORT   Patient Name:   TREBA BECKIUS Date of Exam: 10/02/2023 Medical Rec #:  [id number]      Height:       61.0 in Accession #:    [id number]     Weight:       175.0 lb Date of Birth:  1976/01/04      BSA:          1.785 m Patient Age:    47 years       BP:           146/72 mmHg Patient Gender: F              HR:           68 bpm. [facility] Procedure: 2D Echo, 3D Echo, Cardiac Doppler, Color Doppler and Strain Analysis Indications:   Stroke  History:       Patient has no prior history of Echocardiogram examinations.                Stroke.  Sonographer:   Karma Ganja Referring      [id number] Onnie Boer Phys:  Sonographer Comments: Global longitudinal strain was attempted. IMPRESSIONS  1. Left ventricular ejection fraction, by estimation, is 65 to 70%. The left ventricle has normal function. Left ventricular endocardial border not optimally defined to evaluate regional wall motion. Left ventricular diastolic parameters were normal.  2. Right ventricular systolic function is normal. The right ventricular size is normal. Tricuspid regurgitation signal is inadequate for assessing PA pressure.  3. The mitral valve is grossly normal. Trivial mitral valve regurgitation.  4. The aortic valve was not well visualized. Aortic valve regurgitation is not  visualized. Aortic valve mean gradient measures 6.0 mmHg.  5. The inferior vena cava is normal in size with <50% respiratory variability, suggesting right atrial pressure of 8 mmHg. Comparison(s): No prior Echocardiogram. FINDINGS  Left Ventricle: Left ventricular ejection fraction, by estimation, is 65 to 70%. The left ventricle has normal function. Left ventricular endocardial border not optimally defined to evaluate regional wall motion. Global longitudinal strain performed but  not reported based on interpreter judgement due to suboptimal tracking. The left ventricular internal cavity size was normal in size. There is no left ventricular hypertrophy. Left ventricular diastolic parameters were normal. Right Ventricle: The right ventricular size is normal. No increase in right ventricular wall thickness. Right ventricular systolic  function is normal. Tricuspid regurgitation signal is inadequate for assessing PA pressure. Left Atrium: Left atrial size was normal in size. Right Atrium: Right atrial size was normal in size. Pericardium: There is no evidence of pericardial effusion. Presence of epicardial fat layer. Mitral Valve: The mitral valve is grossly normal. There is mild calcification of the mitral valve leaflet(s). Mild mitral annular calcification. Trivial mitral valve regurgitation. Tricuspid Valve: The tricuspid valve is grossly normal. Tricuspid valve regurgitation is trivial. Aortic Valve: The aortic valve was not well visualized. Aortic valve regurgitation is not visualized. Aortic valve mean gradient measures 6.0 mmHg. Aortic valve peak gradient measures 10.8 mmHg. Aortic valve area, by VTI measures 1.95 cm. Pulmonic Valve: The pulmonic valve was not well visualized. Pulmonic valve regurgitation is not visualized. Aorta: The aortic root is normal in size and structure. Venous: The inferior vena cava is normal in size with less than 50% respiratory variability, suggesting right atrial pressure of 8  mmHg. IAS/Shunts: No atrial level shunt detected by color flow Doppler.  LEFT VENTRICLE PLAX 2D LVIDd:         5.50 cm   Diastology LVIDs:         3.50 cm   LV e' medial:    8.92 cm/s LV PW:         1.00 cm   LV E/e' medial:  14.1 LV IVS:        0.90 cm   LV e' lateral:   9.57 cm/s LVOT diam:     2.00 cm   LV E/e' lateral: 13.2 LV SV:         73 LV SV Index:   41 LVOT Area:     3.14 cm                           3D Volume EF:                          3D EF:        58 %                          LV EDV:       143 ml                          LV ESV:       60 ml                          LV SV:        83 ml RIGHT VENTRICLE             IVC RV Basal diam:  2.60 cm     IVC diam: 1.80 cm RV S prime:     14.10 cm/s TAPSE (M-mode): 3.3 cm LEFT ATRIUM             Index        RIGHT ATRIUM           Index LA diam:        3.20 cm 1.79 cm/m   RA Area:     14.30 cm LA Vol (A2C):   91.1 ml 51.05 ml/m  RA Volume:   33.80 ml  18.94 ml/m LA Vol (A4C):   50.1 ml 28.07 ml/m LA Biplane Vol: 70.0 ml 39.23 ml/m  AORTIC VALVE AV Area (Vmax):    1.99 cm AV Area (Vmean):   1.94 cm AV Area (VTI):     1.95 cm AV Vmax:           164.00 cm/s AV Vmean:          110.000 cm/s AV VTI:            0.374 m AV Peak Grad:      10.8 mmHg AV Mean Grad:      6.0 mmHg LVOT Vmax:         104.00 cm/s LVOT Vmean:        68.100 cm/s LVOT VTI:          0.232 m LVOT/AV VTI ratio: 0.62  AORTA Ao Root diam: 2.70 cm MITRAL VALVE MV Area (PHT): 3.50 cm     SHUNTS MV Decel Time: 217 msec     Systemic VTI:  0.23 m MV E velocity: 126.00 cm/s  Systemic Diam: 2.00 cm MV A velocity: 119.00 cm/s MV E/A ratio:  1.06 Nona Dell MD Electronically signed by Nona Dell MD Signature Date/Time: 10/02/2023/10:22:17 AM    Final    MR BRAIN WO CONTRAST  Addendum Date: 10/01/2023   ADDENDUM REPORT: 10/01/2023 21:00 ADDENDUM: Correction: The infarct is in the medulla, not the midbrain. Electronically Signed   By: Feliberto Harts M.D.   On: 10/01/2023 21:00    Result Date: 10/01/2023 CLINICAL DATA:  Neuro deficit, acute, stroke suspected EXAM: MRI HEAD WITHOUT CONTRAST TECHNIQUE: Multiplanar, multiecho pulse sequences of the brain and surrounding structures were obtained without intravenous contrast. COMPARISON:  CTA head/neck from today. FINDINGS: Brain: Acute infarct in the right midbrain. Slight edema without mass effect. Age advanced T2/FLAIR hyperintensities in the white matter. No acute hemorrhage, mass lesion, midline shift or hydrocephalus. Vascular: Major arterial flow voids are maintained skull base. Skull and upper cervical spine: Normal marrow signal. Sinuses/Orbits: Negative. IMPRESSION: 1. Acute infarct in the right midbrain. 2. Moderate T2/FLAIR hyperintensities in the white matter, which are age advanced. These most likely represent accelerated chronic microvascular ischemic disease, but chronic demyelination could have a similar appearance. Electronically Signed: By: Feliberto Harts M.D. On: 10/01/2023 17:53   CT ANGIO HEAD NECK W WO CM  Result Date: 10/01/2023 CLINICAL DATA:  Neuro deficit, acute, stroke suspected EXAM: CT ANGIOGRAPHY HEAD AND NECK WITH AND WITHOUT CONTRAST TECHNIQUE: Multidetector CT imaging of the head and neck was performed using the standard protocol during bolus administration of intravenous contrast. Multiplanar CT image reconstructions and MIPs were obtained to evaluate the vascular anatomy. Carotid stenosis measurements (when applicable) are obtained utilizing NASCET criteria, using the distal internal carotid diameter as the denominator. RADIATION DOSE REDUCTION: This exam was performed according to the departmental dose-optimization program which includes automated exposure control, adjustment of the mA and/or kV according to patient size and/or use of iterative reconstruction technique. CONTRAST:  75mL OMNIPAQUE IOHEXOL 350 MG/ML SOLN COMPARISON:  None Available. FINDINGS: CT HEAD FINDINGS Brain: No evidence of  acute infarction, hemorrhage, hydrocephalus, extra-axial collection or mass lesion/mass effect. Vascular: See below. Skull: No acute fracture. Sinuses/Orbits: No acute finding. Other: No mastoid effusions. Review of the MIP images confirms the above findings CTA NECK FINDINGS Aortic arch: Great vessel origins are patent without significant stenosis. Right carotid system: No evidence of dissection, stenosis (50% or greater), or occlusion. Left carotid system: No evidence of dissection, stenosis (50% or greater), or occlusion. Vertebral arteries: Severe right vertebral artery origin stenosis. Left vertebral artery  is patent. Skeleton: No acute fracture. Other neck: No acute abnormality on limited assessment. Upper chest: Visualized lung apices are clear. Review of the MIP images confirms the above findings CTA HEAD FINDINGS Anterior circulation: Bilateral intracranial ICAs, MCAs and ACAs are patent. Moderate bilateral paraclinoid ICA stenosis Posterior circulation: Bilateral intradural vertebral arteries, basilar artery and bilateral posterior cerebral arteries are patent proximally. The left PCA is small with suspected superimposed moderate stenosis of the P2 PCA which is irregular Venous sinuses: As permitted by contrast timing, patent. Review of the MIP images confirms the above findings IMPRESSION: 1. No emergent large vessel occlusion. 2. The left PCA is small with suspected superimposed moderate to severe stenosis of the P2 PCA which is irregular and diminutive. 3. Moderate bilateral paraclinoid ICA stenosis. 4. Severe right vertebral artery origin stenosis. Electronically Signed   By: Feliberto Harts M.D.   On: 10/01/2023 17:27    Microbiology: Results for orders placed or performed during the hospital encounter of 10/01/23  SARS Coronavirus 2 by RT PCR (hospital order, performed in [facility] hospital lab) *cepheid single result test* Anterior Nasal Swab     Status: None   Collection Time: 10/03/23   7:32 AM   Specimen: Anterior Nasal Swab  Result Value Ref Range Status   SARS Coronavirus 2 by RT PCR NEGATIVE NEGATIVE Final    Comment: (NOTE) SARS-CoV-2 target nucleic acids are NOT DETECTED.  The SARS-CoV-2 RNA is generally detectable in upper and lower respiratory specimens during the acute phase of infection. The lowest concentration of SARS-CoV-2 viral copies this assay can detect is 250 copies / mL. A negative result does not preclude SARS-CoV-2 infection and should not be used as the sole basis for treatment or other patient management decisions.  A negative result may occur with improper specimen collection / handling, submission of specimen other than nasopharyngeal swab, presence of viral mutation(s) within the areas targeted by this assay, and inadequate number of viral copies (<250 copies / mL). A negative result must be combined with clinical observations, patient history, and epidemiological information.  Fact Sheet for Patients:   RoadLapTop.co.za  Fact Sheet for Healthcare Providers: http://kim-miller.com/  This test is not yet approved or  cleared by the [place] FDA and has been authorized for detection and/or diagnosis of SARS-CoV-2 by FDA under an Emergency Use Authorization (EUA).  This EUA will remain in effect (meaning this test can be used) for the duration of the COVID-19 declaration under Section 564(b)(1) of the Act, 21 U.S.C. section 360bbb-3(b)(1), unless the authorization is terminated or revoked sooner.  Performed at [facility], [address], [place], [place] [address]     Labs: CBC: Recent Labs  Lab 10/01/23 1456  WBC 9.6  NEUTROABS 7.5  HGB 15.2*  HCT 44.0  MCV 91.7  PLT 243   Basic Metabolic Panel: Recent Labs  Lab 10/01/23 1456 10/04/23 0459  NA 132* 136  K 3.6 3.7  CL 100 101  CO2 22 23  GLUCOSE 233* 157*  BUN 15 21*  CREATININE 0.57 0.58  CALCIUM 8.6* 9.1  MG 1.9  2.0   Liver Function Tests: Recent Labs  Lab 10/01/23 1456  AST 23  ALT 31  ALKPHOS 82  BILITOT 0.5  PROT 6.9  ALBUMIN 3.8   CBG: Recent Labs  Lab 10/05/23 1108 10/05/23 1623 10/05/23 2016 10/06/23 0537 10/06/23 0800  GLUCAP 275* 155* 149* 174* 173*    Discharge time spent: greater than 30 minutes.  Signed: Catarina Hartshorn,  MD [facility] 10/06/2023

## 2023-10-06 NOTE — Progress Notes (Signed)
Patient ID: Melissa Wilkerson, female   DOB: 1976/09/18, 47 y.o.   MRN: [id number] Met with the patient to review current situation, rehab process, team conference and plan of care. Discussed secondary risk management including smoking cessation, DM (A1C 6.2) home on metformin, HTN, on Norvasc and Cozaar, HLD (LDL 164/Trig 481) on Lipitor. Reviewed HH/CMM diet modifications.  Reported nicotine patch not needed at present. Reviewed DAPT x 3 weeks then ASA solo.  Continue to follow along to address educational needs to facilitate preparation for discharge. Pamelia Hoit

## 2023-10-06 NOTE — Progress Notes (Signed)
Erick Colace, MD  Physician Physical Medicine and Rehabilitation   PMR Pre-admission    Signed   Date of Service: 10/05/2023  9:16 PM  Related encounter: ED to Hosp-Admission (Discharged) from 10/01/2023 in [facility]   Signed     Expand All Collapse All  PMR Admission Coordinator Pre-Admission Assessment   Patient: Melissa Wilkerson is an 47 y.o., female MRN: [id number] DOB: [birth year] Height: 5\' 2"  (157.5 cm) Weight: 79.4 kg   Insurance Information HMO:     PPO: yes     PCP:      IPA:      80/20:      OTHER:  PRIMARY: [employer] of [place]      Policy#: [id number]      Subscriber: Pt. Approved 11/15 for admit 11/15 - 11/24 CM Name:   not stated    Phone#: [phone]     Fax#: [phone] Pre-Cert#: [id number]      Employer:  Benefits:  Phone #: [phone]     Name:  Eff. Date: 08/18/18     Deduct: $500 ($0 met)      Out of Pocket Max: $5000 ($0 met)       Life Max:  CIR: 70%      SNF: no coverage Outpatient: 70%     Co-Pay: 30% Home Health: 70%      Co-Pay: 30% DME: 70%     Co-Pay: 30% Providers:  SECONDARY:       Policy#:      Phone#:    Artist:       Phone#:    The "Data Collection Information Summary" for patients in Inpatient Rehabilitation Facilities with attached "Privacy Act Statement-Health Care Records" was provided and verbally reviewed with: Patient and Family   Emergency Contact Information Contact Information       Name Relation Home Work Mobile    Petkus,Brian Spouse [phone]             Other Contacts       Name Relation Home Work Mobile    [place] Father     [phone]    Lenoard Aden     [phone]    Chauntell, Credit     [phone]           Current Medical History  Patient Admitting Diagnosis: CVA    History of Present Illness: Pt is a 47 y/o female with PMH of HTN, tobacco use, DM, who presented to [facility] on 10/01/23 with c/o numbness of her R face and UE.  Also  endorses numbness on RLE about 1 week prior to admit as well as R lean with gait.  In ED hemodynamically stable and labs unremarkable except sodium 132.  MRI showed acute infarct in the right midbrain.  CTA head/neck negative for LVO but showed moderate stenosis of P2 PCA.  Neurology consulted and recommended DAPT x3 weeks followed by aspirin daily.  THerapy evaluations were completed and pt was recommended for CIR.  Complete NIHSS TOTAL: 0   Patient's medical record from [facility] has been reviewed by the rehabilitation admission coordinator and physician.   Past Medical History      Past Medical History:  Diagnosis Date   Abdominal bloating 05/26/2015   Breast nodule 03/31/2015   Dyspareunia 05/26/2015   Essential hypertension, benign 09/28/2019   HLD (hyperlipidemia) 09/28/2019   Hypothyroidism, adult 09/28/2019   Vitamin D deficiency disease 09/28/2019  Has the patient had major surgery during 100 days prior to admission? No   Family History   family history includes Cancer in her father, mother, and paternal grandfather; Diabetes in her maternal grandmother and mother; Hypertension in her maternal grandmother and mother.   Current Medications  Current Medications    Current Facility-Administered Medications:    acetaminophen (TYLENOL) tablet 650 mg, 650 mg, Oral, Q4H PRN **OR** acetaminophen (TYLENOL) 160 MG/5ML solution 650 mg, 650 mg, Per Tube, Q4H PRN **OR** acetaminophen (TYLENOL) suppository 650 mg, 650 mg, Rectal, Q4H PRN, Emokpae, Ejiroghene E, MD   amLODipine (NORVASC) tablet 5 mg, 5 mg, Oral, Daily, Tat, David, MD, 5 mg at 10/05/23 0816   aspirin EC tablet 81 mg, 81 mg, Oral, Daily, Emokpae, Ejiroghene E, MD, 81 mg at 10/05/23 0816   atorvastatin (LIPITOR) tablet 40 mg, 40 mg, Oral, Daily, Emokpae, Ejiroghene E, MD, 40 mg at 10/05/23 0816   clopidogrel (PLAVIX) tablet 75 mg, 75 mg, Oral, Daily, Emokpae, Ejiroghene E, MD, 75 mg at 10/05/23 0816   enoxaparin  (LOVENOX) injection 40 mg, 40 mg, Subcutaneous, Q24H, Emokpae, Ejiroghene E, MD, 40 mg at 10/05/23 2012   hydrALAZINE (APRESOLINE) injection 10 mg, 10 mg, Intravenous, Q4H PRN, Emokpae, Ejiroghene E, MD   insulin aspart (novoLOG) injection 0-9 Units, 0-9 Units, Subcutaneous, TID WC, Tat, Onalee Hua, MD, 2 Units at 10/05/23 1725   Oral care mouth rinse, 15 mL, Mouth Rinse, PRN, Emokpae, Ejiroghene E, MD   senna-docusate (Senokot-S) tablet 1 tablet, 1 tablet, Oral, QHS PRN, Emokpae, Ejiroghene E, MD, 1 tablet at 10/02/23 2039     Patients Current Diet:  Diet Order                  Diet Heart Room service appropriate? Yes; Fluid consistency: Thin  Diet effective now                         Precautions / Restrictions Precautions Precautions: Fall Restrictions Weight Bearing Restrictions: No    Has the patient had 2 or more falls or a fall with injury in the past year? No   Prior Activity Level Community (5-7x/wk): independent, no DME used   Prior Functional Level Self Care: Did the patient need help bathing, dressing, using the toilet or eating? Independent   Indoor Mobility: Did the patient need assistance with walking from room to room (with or without device)? Independent   Stairs: Did the patient need assistance with internal or external stairs (with or without device)? Independent   Functional Cognition: Did the patient need help planning regular tasks such as shopping or remembering to take medications? Independent   Patient Information Are you of Hispanic, Latino/a,or Spanish origin?: A. No, not of Hispanic, Latino/a, or Spanish origin What is your race?: A. White Do you need or want an interpreter to communicate with a doctor or health care staff?: 0. No   Patient's Response To:  Health Literacy and Transportation Is the patient able to respond to health literacy and transportation needs?: Yes Health Literacy - How often do you need to have someone help you when you  read instructions, pamphlets, or other written material from your doctor or pharmacy?: Never In the past 12 months, has lack of transportation kept you from medical appointments or from getting medications?: No In the past 12 months, has lack of transportation kept you from meetings, work, or from getting things needed for daily living?: No   Home Assistive  Devices / Equipment Home Equipment: Agricultural consultant (2 wheels), Shower seat   Prior Device Use: Indicate devices/aids used by the patient prior to current illness, exacerbation or injury? None of the above   Current Functional Level Cognition   Overall Cognitive Status: Within Functional Limits for tasks assessed Orientation Level: Oriented X4 General Comments: pleasant and cooperative with therapy    Extremity Assessment (includes Sensation/Coordination)   Upper Extremity Assessment: Overall WFL for tasks assessed  Lower Extremity Assessment: Defer to PT evaluation RLE Deficits / Details: grossly 3+/5 RLE Sensation: WNL RLE Coordination: decreased gross motor     ADLs   Overall ADL's : Needs assistance/impaired Eating/Feeding: Modified independent, Sitting Grooming: Minimal assistance, Contact guard assist, Standing Upper Body Bathing: Modified independent, Sitting Lower Body Bathing: Set up, Sitting/lateral leans Upper Body Dressing : Modified independent, Sitting Lower Body Dressing: Modified independent, Sitting/lateral leans Toilet Transfer: Minimal assistance, Contact guard assist, Stand-pivot, Rolling walker (2 wheels) Toilet Transfer Details (indicate cue type and reason): Simualted via EOB to chair transfer. Toileting- Clothing Manipulation and Hygiene: Set up, Sitting/lateral lean Tub/ Shower Transfer: Contact guard assist, Minimal assistance, Rolling walker (2 wheels) Functional mobility during ADLs: Contact guard assist, Minimal assistance, Rolling walker (2 wheels) General ADL Comments: Unsteady when in standing  completin ADL's     Mobility   Overal bed mobility: Modified Independent General bed mobility comments: sitting at edge of bed on therapist arrival     Transfers   Overall transfer level: Needs assistance Equipment used: Rolling walker (2 wheels) Transfers: Sit to/from Stand Sit to Stand: Contact guard assist Bed to/from chair/wheelchair/BSC transfer type:: Step pivot Step pivot transfers: Contact guard assist, Min assist General transfer comment: wide base of suppor having to lean on nearby objects for support when not using an AD     Ambulation / Gait / Stairs / Wheelchair Mobility   Ambulation/Gait Ambulation/Gait assistance: Min assist, Mod assist Gait Distance (Feet): 50 Feet Assistive device: Rolling walker (2 wheels) Gait Pattern/deviations: Decreased step length - right, Decreased step length - left, Decreased stride length, Antalgic, Ataxic, Wide base of support, Staggering right General Gait Details: ambulates with RW and CGA/min A; starts to list to the right as she fatigues Gait velocity: decreased     Posture / Balance Dynamic Sitting Balance Sitting balance - Comments: seated at EOB Balance Overall balance assessment: Needs assistance Sitting-balance support: Feet supported, No upper extremity supported Sitting balance-Leahy Scale: Good Sitting balance - Comments: seated at EOB Standing balance support: During functional activity, No upper extremity supported Standing balance-Leahy Scale: Poor Standing balance comment: fair with CGA     Special needs/care consideration Diabetic management yes    Previous Home Environment (from acute therapy documentation) Living Arrangements: Spouse/significant other Available Help at Discharge: Family, Other (Comment), Available 24 hours/day Type of Home: House Home Layout: Two level, Able to live on main level with bedroom/bathroom, Laundry or work area in basement Home Access: Stairs to enter Entrance Stairs-Rails:  None Secretary/administrator of Steps: 2 Bathroom Shower/Tub: Engineer, manufacturing systems: Pharmacist, community: Yes Home Care Services: No   Discharge Living Setting Plans for Discharge Living Setting: Patient's home, Lives with (comment) (spouse) Type of Home at Discharge: House Discharge Home Layout: Able to live on main level with bedroom/bathroom Discharge Home Access: Stairs to enter Entrance Stairs-Rails: None Entrance Stairs-Number of Steps: 2 Discharge Bathroom Shower/Tub: Tub/shower unit Discharge Bathroom Toilet: Standard Discharge Bathroom Accessibility: Yes How Accessible: Accessible via walker Does the patient have any problems  obtaining your medications?: No   Social/Family/Support Systems Patient Roles: Spouse Anticipated Caregiver: Arlys John Anticipated Caregiver's Contact Information: [phone] Ability/Limitations of Caregiver: none stated Caregiver Availability: 24/7 Discharge Plan Discussed with Primary Caregiver: Yes Is Caregiver In Agreement with Plan?: Yes Does Caregiver/Family have Issues with Lodging/Transportation while Pt is in Rehab?: No   Goals Patient/Family Goal for Rehab: PT/OT/SLP supervision to mod I Expected length of stay: 9-12 days Additional Information: Discharge plan: return home at intermittent mod I level.  Arlys John can provide 24/7 supervision if needed Pt/Family Agrees to Admission and willing to participate: Yes Program Orientation Provided & Reviewed with Pt/Caregiver Including Roles  & Responsibilities: Yes   Decrease burden of Care through IP rehab admission: n/a   Possible need for SNF placement upon discharge: Not anticipated.  Pt with good progress, anticipated discharge home at intermittent mod I level with spouse able to assist 24/7 if needed.    Patient Condition: I have reviewed medical records from [facility], spoken with CM, and patient and spouse. I discussed via phone for inpatient rehabilitation assessment.   Patient will benefit from ongoing PT, OT, and SLP, can actively participate in 3 hours of therapy a day 5 days of the week, and can make measurable gains during the admission.  Patient will also benefit from the coordinated team approach during an Inpatient Acute Rehabilitation admission.  The patient will receive intensive therapy as well as Rehabilitation physician, nursing, social worker, and care management interventions.  Due to bladder management, bowel management, safety, skin/wound care, disease management, medication administration, pain management, and patient education the patient requires 24 hour a day rehabilitation nursing.  The patient is currently min to mod assist with mobility and basic ADLs.  Discharge setting and therapy post discharge at home with home health is anticipated.  Patient has agreed to participate in the Acute Inpatient Rehabilitation Program and will admit today.   Preadmission Screen Completed By:  Stephania Fragmin, 10/05/2023 9:16 PM ______________________________________________________________________   Discussed status with Dr. Wynn Banker  on 10/06/23 at 930 and received approval for admission today.   Admission Coordinator:  Stephania Fragmin, PT, time 1050/Date 10/06/23    Assessment/Plan: Diagnosis:Right brainstem infarct Does the need for close, 24 hr/day Medical supervision in concert with the patient's rehab needs make it unreasonable for this patient to be served in a less intensive setting? Yes Co-Morbidities requiring supervision/potential complications: HTN Due to bladder management, bowel management, safety, skin/wound care, disease management, medication administration, pain management, and patient education, does the patient require 24 hr/day rehab nursing? Yes Does the patient require coordinated care of a physician, rehab nurse, PT, OT, and SLP to address physical and functional deficits in the context of the above medical diagnosis(es)?  Yes Addressing deficits in the following areas: balance, endurance, locomotion, strength, transferring, bowel/bladder control, bathing, dressing, feeding, grooming, toileting, cognition, and psychosocial support Can the patient actively participate in an intensive therapy program of at least 3 hrs of therapy 5 days a week? Yes The potential for patient to make measurable gains while on inpatient rehab is good Anticipated functional outcomes upon discharge from inpatient rehab: supervision PT, supervision OT, modified independent and supervision SLP Estimated rehab length of stay to reach the above functional goals is: 9-12d Anticipated discharge destination: Home 10. Overall Rehab/Functional Prognosis: good     MD Signature: Erick Colace M.D. [facility] Fellow Am Acad of Phys Med and Rehab Diplomate Am Board of Electrodiagnostic Med Fellow Am Board of Interventional Pain  Revision History

## 2023-10-06 NOTE — Discharge Instructions (Signed)
Inpatient Rehab Discharge Instructions  Melissa Wilkerson Discharge date and time: 10/09/2023  Activities/Precautions/ Functional Status: Activity: no lifting, driving, or strenuous exercise until cleared by MD Diet: diabetic diet Wound Care: none needed Functional status:  ___ No restrictions     ___ Walk up steps independently ___ 24/7 supervision/assistance   ___ Walk up steps with assistance _x__ Intermittent supervision/assistance  ___ Bathe/dress independently ___ Walk with walker     ___ Bathe/dress with assistance ___ Walk Independently    ___ Shower independently ___ Walk with assistance    __x_ Shower with assistance __x_ No alcohol     ___ Return to work/school ________  Special Instructions: No driving, alcohol consumption or tobacco use.  Recommend checking fingerstick blood sugars two (2) times daily and record. Bring this information with you to follow-up appointment with PCP.  Recommend daily BP measurement in same arm and record time of day. Bring this information with you to follow-up appointment with PCP.   COMMUNITY REFERRALS UPON DISCHARGE:    Outpatient: PT  &  OT             Agency:[facility]  [address] [place] [address] Phone:[phone]              Appointment Date/Time:WILL CALL TO SET UP FOLLOW UP APPOINTMENTS  Medical Equipment/Items Ordered: NO NEEDS                                                 Agency/Supplier:NA   STROKE/TIA DISCHARGE INSTRUCTIONS SMOKING Cigarette smoking nearly doubles your risk of having a stroke & is the single most alterable risk factor  If you smoke or have smoked in the last 12 months, you are advised to quit smoking for your health. Most of the excess cardiovascular risk related to smoking disappears within a year of stopping. Ask you doctor about anti-smoking medications [place] Quit Line: 1-800-QUIT NOW Free Smoking Cessation Classes [phone]  CHOLESTEROL Know your levels; limit fat &  cholesterol in your diet  Lipid Panel     Component Value Date/Time   CHOL 241 (H) 10/02/2023 0436   TRIG 481 (H) 10/02/2023 0436   HDL 30 (L) 10/02/2023 0436   CHOLHDL 8.0 10/02/2023 0436   VLDL UNABLE TO CALCULATE IF TRIGLYCERIDE OVER 400 mg/dL 25/36/6440 3474   LDLCALC UNABLE TO CALCULATE IF TRIGLYCERIDE OVER 400 mg/dL 25/95/6387 5643   LDLCALC 134 (H) 03/13/2021 0000     Many patients benefit from treatment even if their cholesterol is at goal. Goal: Total Cholesterol (CHOL) less than 160 Goal:  Triglycerides (TRIG) less than 150 Goal:  HDL greater than 40 Goal:  LDL (LDLCALC) less than 100   BLOOD PRESSURE American Stroke Association blood pressure target is less that 120/80 mm/Hg  Your discharge blood pressure is:  BP: (!) 147/70 Monitor your blood pressure Limit your salt and alcohol intake Many individuals will require more than one medication for high blood pressure  DIABETES (A1c is a blood sugar average for last 3 months) Goal HGBA1c is under 7% (HBGA1c is blood sugar average for last 3 months)  Diabetes: Diagnosis of diabetes:  Your A1c:7.4 %    Lab Results  Component Value Date   HGBA1C 7.4 (H) 10/01/2023    Your HGBA1c can be lowered with medications, healthy diet, and exercise. Check  your blood sugar as directed by your physician Call your physician if you experience unexplained or low blood sugars.  PHYSICAL ACTIVITY/REHABILITATION Goal is 30 minutes at least 4 days per week  Activity: Increase activity slowly, Therapies: Physical Therapy: Outpatient and Occupational Therapy: Outpatient Return to work: When cleared by MD Activity decreases your risk of heart attack and stroke and makes your heart stronger.  It helps control your weight and blood pressure; helps you relax and can improve your mood. Participate in a regular exercise program. Talk with your doctor about the best form of exercise for you (dancing, walking, swimming, cycling).  DIET/WEIGHT Goal is  to maintain a healthy weight  Your discharge diet is:  Diet Order             Diet Heart Room service appropriate? Yes; Fluid consistency: Thin  Diet effective now                 Thin liquids Your height is:    Your current weight is:   Your Body Mass Index (BMI) is:    Following the type of diet specifically designed for you will help prevent another stroke. Your goal weight range is:   Your goal Body Mass Index (BMI) is 19-24. Healthy food habits can help reduce 3 risk factors for stroke:  High cholesterol, hypertension, and excess weight.  RESOURCES Stroke/Support Group:  Call [phone]   STROKE EDUCATION PROVIDED/REVIEWED AND GIVEN TO PATIENT Stroke warning signs and symptoms How to activate emergency medical system (call 911). Medications prescribed at discharge. Need for follow-up after discharge. Personal risk factors for stroke. Pneumonia vaccine given: No Flu vaccine given: No My questions have been answered, the writing is legible, and I understand these instructions.  I will adhere to these goals & educational materials that have been provided to me after my discharge from the hospital.     My questions have been answered and I understand these instructions. I will adhere to these goals and the provided educational materials after my discharge from the hospital.  Patient/Caregiver Signature _______________________________ Date __________  Clinician Signature _______________________________________ Date __________  Please bring this form and your medication list with you to all your follow-up doctor's appointments.

## 2023-10-07 DIAGNOSIS — I639 Cerebral infarction, unspecified: Secondary | ICD-10-CM | POA: Diagnosis not present

## 2023-10-07 LAB — URINALYSIS, ROUTINE W REFLEX MICROSCOPIC
Bacteria, UA: NONE SEEN
Bilirubin Urine: NEGATIVE
Glucose, UA: >=500 mg/dL — AB
Hgb urine dipstick: NEGATIVE
Ketones, ur: NEGATIVE mg/dL
Leukocytes,Ua: NEGATIVE
Nitrite: NEGATIVE
Protein, ur: NEGATIVE mg/dL
Specific Gravity, Urine: 1.017 (ref 1.005–1.030)
pH: 5 (ref 5.0–8.0)

## 2023-10-07 LAB — CBC WITH DIFFERENTIAL/PLATELET
Abs Immature Granulocytes: 0.1 10*3/uL — ABNORMAL HIGH (ref 0.00–0.07)
Basophils Absolute: 0.1 10*3/uL (ref 0.0–0.1)
Basophils Relative: 1 %
Eosinophils Absolute: 0.3 10*3/uL (ref 0.0–0.5)
Eosinophils Relative: 2 %
HCT: 45.3 % (ref 36.0–46.0)
Hemoglobin: 15.6 g/dL — ABNORMAL HIGH (ref 12.0–15.0)
Immature Granulocytes: 1 %
Lymphocytes Relative: 21 %
Lymphs Abs: 3 10*3/uL (ref 0.7–4.0)
MCH: 30.8 pg (ref 26.0–34.0)
MCHC: 34.4 g/dL (ref 30.0–36.0)
MCV: 89.3 fL (ref 80.0–100.0)
Monocytes Absolute: 0.7 10*3/uL (ref 0.1–1.0)
Monocytes Relative: 5 %
Neutro Abs: 9.8 10*3/uL — ABNORMAL HIGH (ref 1.7–7.7)
Neutrophils Relative %: 70 %
Platelets: 298 10*3/uL (ref 150–400)
RBC: 5.07 MIL/uL (ref 3.87–5.11)
RDW: 12.2 % (ref 11.5–15.5)
WBC: 14 10*3/uL — ABNORMAL HIGH (ref 4.0–10.5)
nRBC: 0 % (ref 0.0–0.2)

## 2023-10-07 LAB — GLUCOSE, CAPILLARY
Glucose-Capillary: 148 mg/dL — ABNORMAL HIGH (ref 70–99)
Glucose-Capillary: 211 mg/dL — ABNORMAL HIGH (ref 70–99)
Glucose-Capillary: 163 mg/dL — ABNORMAL HIGH (ref 70–99)
Glucose-Capillary: 164 mg/dL — ABNORMAL HIGH (ref 70–99)

## 2023-10-07 LAB — COMPREHENSIVE METABOLIC PANEL
ALT: 33 U/L (ref 0–44)
AST: 24 U/L (ref 15–41)
Albumin: 3.8 g/dL (ref 3.5–5.0)
Alkaline Phosphatase: 84 U/L (ref 38–126)
Anion gap: 11 (ref 5–15)
BUN: 20 mg/dL (ref 6–20)
CO2: 23 mmol/L (ref 22–32)
Calcium: 9.5 mg/dL (ref 8.9–10.3)
Chloride: 102 mmol/L (ref 98–111)
Creatinine, Ser: 0.55 mg/dL (ref 0.44–1.00)
GFR, Estimated: >60 mL/min (ref >60)
Glucose, Bld: 150 mg/dL — ABNORMAL HIGH (ref 70–99)
Potassium: 4.1 mmol/L (ref 3.5–5.1)
Sodium: 136 mmol/L (ref 135–145)
Total Bilirubin: 0.9 mg/dL (ref <1.2)
Total Protein: 7.2 g/dL (ref 6.5–8.1)

## 2023-10-07 LAB — PROTHROMBIN GENE MUTATION

## 2023-10-07 MED ORDER — METFORMIN HCL ER 500 MG PO TB24
500.0000 mg | ORAL_TABLET | Freq: Every day | ORAL | Status: DC
  Administered 2023-10-07: 500 mg via ORAL
  Filled 2023-10-07: qty 1

## 2023-10-07 MED ORDER — MAGNESIUM GLUCONATE 500 MG PO TABS
250.0000 mg | ORAL_TABLET | Freq: Every day | ORAL | Status: DC
  Administered 2023-10-07: 250 mg via ORAL
  Filled 2023-10-07: qty 1

## 2023-10-07 NOTE — Progress Notes (Signed)
Inpatient Rehabilitation Care Coordinator Assessment and Plan Patient Details  Name: Melissa Wilkerson MRN: [id number] Date of Birth: October 27, 1976  Today's Date: 10/07/2023  Hospital Problems: Principal Problem:   CVA (cerebral vascular accident) [facility])  Past Medical History:  Past Medical History:  Diagnosis Date   Abdominal bloating 05/26/2015   Breast nodule 03/31/2015   Dyspareunia 05/26/2015   Essential hypertension, benign 09/28/2019   HLD (hyperlipidemia) 09/28/2019   Hypothyroidism, adult 09/28/2019   Vitamin D deficiency disease 09/28/2019   Past Surgical History: History reviewed. No pertinent surgical history. Social History:  reports that she has been smoking cigarettes. She has a 17 pack-year smoking history. She has never used smokeless tobacco. She reports that she does not drink alcohol and does not use drugs.  Family / Support Systems Marital Status: Married Patient Roles: Spouse, Parent, Other (Comment) (employee) Spouse/Significant Other: Arlys John [phone] Children: Letitia Libra [phone]  Laren Boom [phone] Other Supports: Extended family and friends Anticipated Caregiver: Arlys John and younger son Ability/Limitations of Caregiver: none Caregiver Availability: 24/7 Family Dynamics: Close knit family who will make sure Mom has her needs met but pt very motivated to do well and recover and get to independent level when leaves here  Social History Preferred language: English Religion: Baptist Cultural Background: no issues Education: HS Health Literacy - How often do you need to have someone help you when you read instructions, pamphlets, or other written material from your doctor or pharmacy?: Never Writes: Yes Employment Status: Employed Name of Employer: [employer] Return to Work Plans: Hopes to return but will need to recover first 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 She is not one to rely upon others and ask for assist. She is the one who is usually helping and giving assist. Recent Psychosocial Issues: other health issues needs a PCP to follow up with Psychiatric History: Hx-anxiety does not take medications for, due to young age feel would benefit from seeing neuro-psych while here Substance Abuse History: Tobacco aware of the health issues with this and plans to quit does not feel needs nicotine patch  Patient / Family Perceptions, Expectations & Goals Pt/Family understanding of illness & functional limitations: Pt and husband can explain her stroke and deficits as a result. Both talk with the MD involved and feel has a good understanding of her plan moving forward. Premorbid pt/family roles/activities: Wife, mom, employee, friend, etc Anticipated changes in roles/activities/participation: resume Pt/family expectations/goals: Pt states; " I hope to do well and recover I'm not one to ask for help."  Husband states; " I know she will push herself and do what she can for herself."  [employer]: None Premorbid Home Care/DME Agencies: None Transportation available at discharge: self and family Is the patient able to respond to transportation needs?: Yes In the past 12 months, has lack of transportation kept you from medical appointments or from getting medications?: No In the past 12 months, has lack of transportation kept you from meetings, work, or from getting  things needed for daily living?: No  Resource referrals recommended: Neuropsychology  Discharge Planning Living Arrangements: Spouse/significant other, Children Support Systems: Spouse/significant other, Children, Other relatives, Friends/neighbors Type of Residence: Private residence Insurance Resources: Media planner (specify) Herbalist) Financial Resources: Employment, Garment/textile technologist Screen Referred: No Living Expenses: Banker Management: Patient, Spouse Does the patient have any problems obtaining your medications?: No (Does not have a PCP) Home Management: self Patient/Family Preliminary Plans: Return home with husband and younger son hoeps will not need 24/7 care at discharge but has if needed. Aware being evaluated today and goals being set. She is working on her [employer] and STD papers. Care Coordinator Barriers to Discharge: Medication compliance Care Coordinator Anticipated Follow Up Needs: HH/OP  Clinical Impression Pleasant female who hopes to do well here and make good progress. Her husband is here and supportive. Aware being evaluated today and goals being set for stay here. She will work on her STD/FMLA forms to get her so can be completed. Have placed on neuro-psych list to be seen this week and will work on PCP to follow at discharge.  Lucy Chris 10/07/2023, 9:59 AM

## 2023-10-07 NOTE — Evaluation (Signed)
Physical Therapy Assessment and Plan  Patient Details  Name: Cloyce Levendusky MRN: [id number] Date of Birth: 07-19-1976  PT Diagnosis: Abnormality of gait, Ataxia, Difficulty walking, Impaired sensation, and Muscle weakness Rehab Potential: Excellent ELOS: 3-5 days   Today's Date: 10/07/2023 PT Individual Time: 1324-4010 PT Individual Time Calculation (min): 72 min    Hospital Problem: Principal Problem:   CVA (cerebral vascular accident) [facility])   Past Medical History:  Past Medical History:  Diagnosis Date   Abdominal bloating 05/26/2015   Breast nodule 03/31/2015   Dyspareunia 05/26/2015   Essential hypertension, benign 09/28/2019   HLD (hyperlipidemia) 09/28/2019   Hypothyroidism, adult 09/28/2019   Vitamin D deficiency disease 09/28/2019   Past Surgical History: History reviewed. No pertinent surgical history.  Assessment & Plan Clinical Impression: Patient is a 47 year old female who presented to the emergency department [facility] on 10/01/2023. She complained of right facial and right hand numbness, dizziness and right sided lean. Past medical history is significant for hyperlipidemia, hypertension, hypothyroidism and pre-diabetes mellitus. Imaging orders obtained and neurology consulted. She was outside the window for tenecteplase. CTA head and neck without emergent large vessel occlusion. To severe stenosis of the P2 PCA. MRI of the brain revealed acute infarct in the right midbrain. She was started on aspirin 81 mg daily Plavix 75 mg daily for 3 weeks to be followed by aspirin alone. Hemoglobin A1c 7.4%. Ejection fraction 65 to 70%. CT of abdomen chest and pelvis performed due to patient's young age and lung nodules noted and recommend outpatient surveillance. Also noted was a left ovarian cystic lesion and pelvic ultrasound performed. GYN follow-up arranged. Left adrenal nodule noted and recommend outpatient MRI. Symptoms have improved and she has tolerated heart healthy  diet. He required min assist to stand and ambulate with rolling walker.The patient requires inpatient medicine and rehabilitation evaluations and services for ongoing dysfunction secondary to right midbrain infarct  Patient transferred to CIR on 10/06/2023 .   Patient currently requires  CGA  with mobility secondary to muscle weakness, decreased cardiorespiratoy endurance, and decreased standing balance and decreased balance strategies.  Prior to hospitalization, patient was independent  with mobility and lived with Spouse, Son in a House home.  Home access is  Level entry.  Patient will benefit from skilled PT intervention to maximize safe functional mobility, minimize fall risk, and decrease caregiver burden for planned discharge home with intermittent assist.  Anticipate patient will benefit from follow up OP at discharge.  PT - End of Session Activity Tolerance: Tolerates 10 - 20 min activity with multiple rests Endurance Deficit: Yes PT Assessment Rehab Potential (ACUTE/IP ONLY): Excellent PT Barriers to Discharge: Insurance for SNF coverage PT Patient demonstrates impairments in the following area(s): Balance;Endurance;Motor;Safety;Sensory PT Transfers Functional Problem(s): Bed Mobility;Bed to Chair;Car PT Locomotion Functional Problem(s): Ambulation;Stairs PT Plan PT Intensity: Minimum of 1-2 x/day ,45 to 90 minutes PT Frequency: 5 out of 7 days PT Duration Estimated Length of Stay: 3-5 days PT Treatment/Interventions: Ambulation/gait training;Discharge planning;DME/adaptive equipment instruction;Functional mobility training;Psychosocial support;Splinting/orthotics;Therapeutic Activities;UE/LE Strength taining/ROM;UE/LE Coordination activities;Therapeutic Exercise;Stair training;Patient/family education;Neuromuscular re-education;Disease management/prevention;Functional electrical stimulation;Community reintegration;Balance/vestibular training PT Transfers Anticipated Outcome(s): mod  I PT Locomotion Anticipated Outcome(s): mod I PT Recommendation Recommendations for Other Services: Neuropsych consult Follow Up Recommendations: Outpatient PT;24 hour supervision/assistance Patient destination: Home Equipment Recommended: To be determined   PT Evaluation Precautions/Restrictions Precautions Precautions: Fall Precaution Comments: ataxia Restrictions Weight Bearing Restrictions: No Pain Interference Pain Interference Pain Effect on Sleep: 0. Does not apply - I  have not had any pain or hurting in the past 5 days Pain Interference with Therapy Activities: 0. Does not apply - I have not received rehabilitationtherapy in the past 5 days Pain Interference with Day-to-Day Activities: 1. Rarely or not at all Home Living/Prior Functioning Home Living Available Help at Discharge: Family;Other (Comment);Available 24 hours/day Type of Home: House Home Access: Level entry Home Layout: One level Bathroom Shower/Tub: Tub/shower unit;Curtain Firefighter: Standard  Lives With: Spouse;Son Prior Function Level of Independence: Independent with transfers;Independent with basic ADLs;Independent with gait  Able to Take Stairs?: Yes Driving: Yes Vocation: Full time employment Vision/Perception  Vision - History Ability to See in Adequate Light: 0 Adequate Perception Perception: Within Functional Limits Praxis Praxis: WFL  Cognition Overall Cognitive Status: Within Functional Limits for tasks assessed Arousal/Alertness: Awake/alert Orientation Level: Oriented X4 Memory: Appears intact Awareness: Appears intact Problem Solving: Appears intact Safety/Judgment: Appears intact Sensation Sensation Light Touch: Impaired by gross assessment Hot/Cold: Impaired by gross assessment Proprioception: Impaired by gross assessment Stereognosis: Not tested Additional Comments: reports L arm sensory impairments and R face sensory deficits Coordination Gross Motor Movements are  Fluid and Coordinated: No Coordination and Movement Description: ataxia Heel Shin Test: ataxic on R Motor  Motor Motor: Ataxia   Trunk/Postural Assessment  Cervical Assessment Cervical Assessment: Within Functional Limits Thoracic Assessment Thoracic Assessment: Within Functional Limits Lumbar Assessment Lumbar Assessment: Within Functional Limits Postural Control Postural Control: Within Functional Limits  Balance Balance Balance Assessed: Yes Standardized Balance Assessment Standardized Balance Assessment: Berg Balance Test Berg Balance Test Sit to Stand: Able to stand without using hands and stabilize independently Standing Unsupported: Able to stand 2 minutes with supervision Sitting with Back Unsupported but Feet Supported on Floor or Stool: Able to sit safely and securely 2 minutes Stand to Sit: Sits safely with minimal use of hands Transfers: Able to transfer safely, definite need of hands Standing Unsupported with Eyes Closed: Able to stand 10 seconds with supervision Standing Ubsupported with Feet Together: Able to place feet together independently and stand for 1 minute with supervision From Standing, Reach Forward with Outstretched Arm: Can reach confidently >25 cm (10") From Standing Position, Pick up Object from Floor: Able to pick up shoe, needs supervision From Standing Position, Turn to Look Behind Over each Shoulder: Looks behind from both sides and weight shifts well Turn 360 Degrees: Needs close supervision or verbal cueing Standing Unsupported, Alternately Place Feet on Step/Stool: Able to complete >2 steps/needs minimal assist Standing Unsupported, One Foot in Front: Able to take small step independently and hold 30 seconds Standing on One Leg: Unable to try or needs assist to prevent fall Total Score: 39 Extremity Assessment      RLE Assessment RLE Assessment: Within Functional Limits LLE Assessment LLE Assessment: Within Functional Limits  Care  Tool Care Tool Bed Mobility Roll left and right activity   Roll left and right assist level: Independent    Sit to lying activity   Sit to lying assist level: Supervision/Verbal cueing    Lying to sitting on side of bed activity   Lying to sitting on side of bed assist level: the ability to move from lying on the back to sitting on the side of the bed with no back support.: Supervision/Verbal cueing     Care Tool Transfers Sit to stand transfer   Sit to stand assist level: Supervision/Verbal cueing    Chair/bed transfer   Chair/bed transfer assist level: Contact Guard/Touching assist    Car transfer  Car transfer assist level: Contact Guard/Touching assist      Care Tool Locomotion Ambulation   Assist level: Contact Guard/Touching assist Assistive device: No Device Max distance: 116ft  Walk 10 feet activity   Assist level: Contact Guard/Touching assist     Walk 50 feet with 2 turns activity   Assist level: Contact Guard/Touching assist Assistive device: No Device  Walk 150 feet activity   Assist level: Contact Guard/Touching assist Assistive device: No Device  Walk 10 feet on uneven surfaces activity   Assist level: Contact Guard/Touching assist    Stairs   Assist level: Contact Guard/Touching assist Stairs assistive device: 2 hand rails Max number of stairs: 12  Walk up/down 1 step activity   Walk up/down 1 step (curb) assist level: Contact Guard/Touching assist Walk up/down 1 step or curb assistive device: 2 hand rails  Walk up/down 4 steps activity   Walk up/down 4 steps assist level: Contact Guard/Touching assist Walk up/down 4 steps assistive device: 2 hand rails  Walk up/down 12 steps activity   Walk up/down 12 steps assist level: Contact Guard/Touching assist Walk up/down 12 steps assistive device: 2 hand rails  Pick up small objects from floor   Pick up small object from the floor assist level: Contact Guard/Touching assist    Wheelchair Is the  patient using a wheelchair?: No          Wheel 50 feet with 2 turns activity      Wheel 150 feet activity        Refer to Care Plan for Long Term Goals  SHORT TERM GOAL WEEK 1 PT Short Term Goal 1 (Week 1): STG = LTG  Recommendations for other services: Neuropsych  Skilled Therapeutic Intervention Mobility Bed Mobility Bed Mobility: Supine to Sit;Sit to Supine Supine to Sit: Supervision/Verbal cueing Sit to Supine: Supervision/Verbal cueing Transfers Transfers: Sit to Stand;Stand Pivot Transfers;Stand to Sit Sit to Stand: Supervision/Verbal cueing Stand to Sit: Supervision/Verbal cueing Stand Pivot Transfers: Contact Guard/Touching assist Transfer (Assistive device): Rolling walker Locomotion  Gait Ambulation: Yes Gait Assistance: Contact Guard/Touching assist Gait Distance (Feet): 1150 Feet Assistive device: None Gait Gait: Yes Gait Pattern: Impaired Gait Pattern: Step-through pattern;Ataxic Stairs / Additional Locomotion Stairs: Yes Stairs Assistance: Contact Guard/Touching assist;Moderate Assistance - Patient 50 - 74% Stair Management Technique: Two rails;Alternating pattern;Forwards Number of Stairs: 12 Height of Stairs: 6 Ramp: Contact Guard/touching assist Wheelchair Mobility Wheelchair Mobility: No  Skilled Intervention: Pt sitting EOB on arrival - her husband, Arlys John, present throughout session. Patient agreeable to PT assessment. No cognitive impairments noted.   Functional mobility completed as outlined above. She presents with ataxia and sensory deficits that affect her functional mobility. Outcome measures completed (BERG, 5XSTS, TUG, , and ABC confidence scale).   5xSTS in 16s *scores > 13.5 seconds indicate increased falls risk  TUG 18s  *Scores > 15 seconds indicate increased falls risk.  = 1,128ft with no AD and CGA  ABC confidence scale = 65%  Instructed pt in results of PT evaluation as detailed above, PT POC, rehab potential,  rehab goals, and discharge recommendations. Additionally discussed CIR's policies regarding fall safety and use of chair alarm and/or quick release belt. Pt verbalized understanding and in agreement. Will update pt's family members as they become available.    The Activities-specific Balance Confidence (ABC) Scale* Instructions to Participants: For each of the following activities, please indicate your level of confidence in doing the activity without losing your balance or becoming unsteady from choosing one  of the percentage points on the scale from 0% to 100% If you do not currently do the activity in question, try and imagine how confident you would be if you had to do the activity. If you normally use a walking aid to do the activity or hold onto someone, rate your confidence as if you were using these supports.    No Confidence 0% 10 20 30  40 50 60 70 80 90 100% Completely Confident   How confident are you that you will not lose your balance or become unsteady when you. 1. .walk around the house? __70___% 2. .walk up or down stairs? ___50__% 3. .bend over and pick up a slipper from the front of a closet floor? __45___% 4. .reach for a small can off a shelf at eye level? __90___% 5. .stand on your tip toes and reach for something above your head? __80___% 6. .stand on a chair and reach for something? ___35__% 7. .sweep the floor? __85___% 8. .walk outside the house to a car parked in the driveway? __80___% 9. .get into or out of a car? ___80__% 10. .walk across a parking lot to the mall? __70___% 11. .walk up or down a ramp? ___85__% 12. .walk in a crowded mall where people rapidly walk past you? __70___% 13. Marland Kitchenare bumped into by people as you walk through the mall? ___50__% 14. .step onto or off of an escalator while you are holding onto a railing? ___75__% 15. .step onto or off an escalator while holding onto parcels such that you cannot hold onto the railing? ___60__% 16. .walk outside  on icy sidewalks? ___15__%  Lowell [employer] LE & Izola Price AM. The Activities-specific Balance Confidence (ABC) Scale. Journal of Gerontology  Med Sci [phone]; 50(1):M28-34. Total ABC Score: ____1040______ Scoring: _________1040____ / 16 =  Total ABC Score: _______65___% of self confidence   Discharge Criteria: Patient will be discharged from PT if patient refuses treatment 3 consecutive times without medical reason, if treatment goals not met, if there is a change in medical status, if patient makes no progress towards goals or if patient is discharged from hospital.  The above assessment, treatment plan, treatment alternatives and goals were discussed and mutually agreed upon: by patient and by family  Orrin Brigham  PT, DPT, CSRS  10/07/2023, 2:31 PM

## 2023-10-07 NOTE — Progress Notes (Signed)
Inpatient Rehabilitation  Patient information reviewed and entered into eRehab system by Oyuki Hogan M. Eri Mcevers, M.A., CCC/SLP, PPS Coordinator.  Information including medical coding, functional ability and quality indicators will be reviewed and updated through discharge.    

## 2023-10-07 NOTE — Plan of Care (Signed)
  Problem: RH Balance Goal: LTG Patient will maintain dynamic standing with ADLs (OT) Description: LTG:  Patient will maintain dynamic standing balance with assist during activities of daily living (OT)  Flowsheets (Taken 10/07/2023 1558) LTG: Pt will maintain dynamic standing balance during ADLs with: Independent with assistive device   Problem: Sit to Stand Goal: LTG:  Patient will perform sit to stand in prep for activites of daily living with assistance level (OT) Description: LTG:  Patient will perform sit to stand in prep for activites of daily living with assistance level (OT) Flowsheets (Taken 10/07/2023 1558) LTG: PT will perform sit to stand in prep for activites of daily living with assistance level: Independent with assistive device   Problem: RH Grooming Goal: LTG Patient will perform grooming w/assist,cues/equip (OT) Description: LTG: Patient will perform grooming with assist, with/without cues using equipment (OT) Flowsheets (Taken 10/07/2023 1558) LTG: Pt will perform grooming with assistance level of: Independent with assistive device    Problem: RH Bathing Goal: LTG Patient will bathe all body parts with assist levels (OT) Description: LTG: Patient will bathe all body parts with assist levels (OT) Flowsheets (Taken 10/07/2023 1558) LTG: Pt will perform bathing with assistance level/cueing: Independent with assistive device    Problem: RH Dressing Goal: LTG Patient will perform upper body dressing (OT) Description: LTG Patient will perform upper body dressing with assist, with/without cues (OT). Flowsheets (Taken 10/07/2023 1558) LTG: Pt will perform upper body dressing with assistance level of: Independent with assistive device Goal: LTG Patient will perform lower body dressing w/assist (OT) Description: LTG: Patient will perform lower body dressing with assist, with/without cues in positioning using equipment (OT) Flowsheets (Taken 10/07/2023 1558) LTG: Pt will  perform lower body dressing with assistance level of: Independent with assistive device   Problem: RH Toileting Goal: LTG Patient will perform toileting task (3/3 steps) with assistance level (OT) Description: LTG: Patient will perform toileting task (3/3 steps) with assistance level (OT)  Flowsheets (Taken 10/07/2023 1558) LTG: Pt will perform toileting task (3/3 steps) with assistance level: Independent with assistive device   Problem: RH Toilet Transfers Goal: LTG Patient will perform toilet transfers w/assist (OT) Description: LTG: Patient will perform toilet transfers with assist, with/without cues using equipment (OT) Flowsheets (Taken 10/07/2023 1558) LTG: Pt will perform toilet transfers with assistance level of: Independent with assistive device   Problem: RH Tub/Shower Transfers Goal: LTG Patient will perform tub/shower transfers w/assist (OT) Description: LTG: Patient will perform tub/shower transfers with assist, with/without cues using equipment (OT) Flowsheets (Taken 10/07/2023 1558) LTG: Pt will perform tub/shower stall transfers with assistance level of: Independent with assistive device

## 2023-10-07 NOTE — Evaluation (Signed)
Speech Language Pathology Assessment and Plan  Patient Details  Name: Melissa Wilkerson MRN: [id number] Date of Birth: Apr 30, 1976   Today's Date: 10/07/2023 SLP Individual Time: 0800-0900 SLP Individual Time Calculation (min): 60 min   Hospital Problem: Principal Problem:   CVA (cerebral vascular accident) [facility])  Past Medical History:  Past Medical History:  Diagnosis Date   Abdominal bloating 05/26/2015   Breast nodule 03/31/2015   Dyspareunia 05/26/2015   Essential hypertension, benign 09/28/2019   HLD (hyperlipidemia) 09/28/2019   Hypothyroidism, adult 09/28/2019   Vitamin D deficiency disease 09/28/2019   Past Surgical History: History reviewed. No pertinent surgical history.  Assessment / Plan / Recommendation Clinical Impression HPI: Pt is a 47 y/o female with PMH of HTN, tobacco use, DM, who presented to [facility] on 10/01/23 with c/o numbness of her R face and UE. Also endorses numbness on RLE about 1 week prior to admit as well as R lean with gait. In ED hemodynamically stable and labs unremarkable except sodium 132. MRI showed acute infarct in the right midbrain. CTA head/neck negative for LVO but showed moderate stenosis of P2 PCA   Clinical Impression:   Communication: Patient's expressive and receptive language WFL. Cognition: Patient evaluated via the CLQT (cognitive linguistic quick test) to assess cognitive and linguistic functioning. Patient scored WFL on all subtest's and denies cognitive changes.  Dysarthria: Patient 100% intelligible at the conversational level. Patient does not require further ST intervention at this time. Please re-consult if status changes.    Skilled Therapeutic Interventions          Patient evaluated using a non-standardized cognitive linguistic assessment and bedside swallow evaluation to assess current cognitive, communicative and swallowing function. See above for details.     SLP Assessment  Patient does not need any further Speech  [place]    Recommendations  Patient destination: Home Follow up Recommendations: None Equipment Recommended: None recommended by SLP     Pain None reported   SLP Evaluation Cognition Overall Cognitive Status: Within Functional Limits for tasks assessed Arousal/Alertness: Awake/alert Orientation Level: Oriented X4  Comprehension Auditory Comprehension Overall Auditory Comprehension: Appears within functional limits for tasks assessed Expression Expression Primary Mode of Expression: Verbal Verbal Expression Overall Verbal Expression: Appears within functional limits for tasks assessed Oral Motor Oral Motor/Sensory Function Overall Oral Motor/Sensory Function: Within functional limits Motor Speech Overall Motor Speech: Appears within functional limits for tasks assessed  Care Tool Care Tool Cognition Ability to hear (with hearing aid or hearing appliances if normally used Ability to hear (with hearing aid or hearing appliances if normally used): 0. Adequate - no difficulty in normal conservation, social interaction, listening to TV   Expression of Ideas and Wants Expression of Ideas and Wants: 4. Without difficulty (complex and basic) - expresses complex messages without difficulty and with speech that is clear and easy to understand   Understanding Verbal and Non-Verbal Content Understanding Verbal and Non-Verbal Content: 4. Understands (complex and basic) - clear comprehension without cues or repetitions  Memory/Recall Ability Memory/Recall Ability : Current season;That he or she is in a hospital/hospital unit   Refer to Care Plan for Long Term Goals  Recommendations for other services: None   Discharge Criteria: Patient will be discharged from SLP if patient refuses treatment 3 consecutive times without medical reason, if treatment goals not met, if there is a change in medical status, if patient makes no progress towards goals or if patient is  discharged from hospital.  The above assessment,  treatment plan, treatment alternatives and goals were discussed and mutually agreed upon: by patient and by family  Mavrick Mcquigg M.A., CF-SLP 10/07/2023, 8:57 AM

## 2023-10-07 NOTE — Progress Notes (Signed)
PROGRESS NOTE   Subjective/Complaints: WBC increased further today but patient has no signs or symptoms of infection, will repeat tomorrow to trend Doing great with therapy and is stable for d/c tomorrow   ROS: +dizziness, denies shortness of breath   Objective:   No results found. Recent Labs    10/07/23 0615  WBC 14.0*  HGB 15.6*  HCT 45.3  PLT 298   Recent Labs    10/07/23 0615  NA 136  K 4.1  CL 102  CO2 23  GLUCOSE 150*  BUN 20  CREATININE 0.55  CALCIUM 9.5    Intake/Output Summary (Last 24 hours) at 10/07/2023 1301 Last data filed at 10/07/2023 0720 Gross per 24 hour  Intake 240 ml  Output --  Net 240 ml        Physical Exam: Vital Signs Blood pressure (!) 148/60, pulse 70, temperature 98.3 F (36.8 C), temperature source Oral, resp. rate 16, SpO2 97%. Gen: no distress, normal appearing HEENT: oral mucosa pink and moist, NCAT Cardio: Reg rate Chest: normal effort, normal rate of breathing Abd: soft, non-distended Ext: no edema Psych: pleasant, normal affect Skin: intact Neuro:  Eyes without evidence of nystagmus  Tone is normal without evidence of spasticity Cerebellar exam shows no evidence of ataxia on finger nose finger or heel to shin testing No evidence of trunkal ataxia   Motor strength is 5/5 in bilateral deltoid, biceps, triceps, finger flexors and extensors, wrist flexors and extensors,LEFT hip flexors, knee flexors and extensors, ankle dorsiflexors, plantar flexors, invertors and evertors, toe flexors and extensors 4/5 Right HF, KE, ADF APF invertors and evertors, exam improved 11/20   Sensory exam is normal to pinprick, proprioception and light touch in the upper and lower limbs    Cranial nerves II- Visual fields are intact to confrontation testing, no blurring of vision III- no evidence of ptosis, upward, downward and medial gaze intact IV- no vertical diplopia or head  tilt V- no facial numbness or masseter weakness VI- no pupil abduction weakness VII- no facial droop, good lid closure VII- normal auditory acuity IX- no pharygeal weakness, gag nl X- no pharyngeal weakness, no hoarseness XI- no trap or SCM weakness XII- no glossal weakness     Assessment/Plan: 1. Functional deficits which require 3+ hours per day of interdisciplinary therapy in a comprehensive inpatient rehab setting. Physiatrist is providing close team supervision and 24 hour management of active medical problems listed below. Physiatrist and rehab team continue to assess barriers to discharge/monitor patient progress toward functional and medical goals  Care Tool:  Bathing              Bathing assist       Upper Body Dressing/Undressing Upper body dressing        Upper body assist      Lower Body Dressing/Undressing Lower body dressing            Lower body assist       Toileting Toileting    Toileting assist       Transfers Chair/bed transfer  Transfers assist           Locomotion Ambulation   Ambulation assist  Walk 10 feet activity   Assist           Walk 50 feet activity   Assist           Walk 150 feet activity   Assist           Walk 10 feet on uneven surface  activity   Assist           Wheelchair     Assist               Wheelchair 50 feet with 2 turns activity    Assist            Wheelchair 150 feet activity     Assist          Blood pressure (!) 148/60, pulse 70, temperature 98.3 F (36.8 C), temperature source Oral, resp. rate 16, SpO2 97%.  Medical Problem List and Plan: 1. Functional deficits secondary to right brainstem infarct 10/01/23             -patient may  shower             -ELOS/Goals: 3 days, discussed plan for d/c tomorrow   Vitamin D and metanx started 2.  Antithrombotics: -DVT/anticoagulation:  Pharmaceutical: Lovenox              -antiplatelet therapy: Aspirin and Plavix for three weeks followed by aspirin alone   3. Pain Management: Tylenol as needed   4. Mood/Behavior/Sleep: LCSW to evaluate and provide emotional support             -antipsychotic agents: n/a   5. Neuropsych/cognition: This patient is capable of making decisions on her own behalf.   6. Skin/Wound Care: Routine skin care checks   7. Fluids/Electrolytes/Nutrition: Routine Is and Os and follow-up chemistries   8: Hypertension: monitor TID and prn             -continue amlodipine 5 mg daily             -continue losartan 25 mg daily  -add magnesium gluconate 250mg  HS, increase to 500mg  HS   9: Hyperlipidemia: continue statin   10: DM-2: CBGs x QID; A1c = 7.4% (diet controlled at home)             -continue SSI, metformin started, discussed that she has experienced no adverse symptoms with this   11: Tobacco use: Cessation counseling  12. Dizziness: discussed importance of staying well hydrated  13. Luekocytosis: UA/UC, repeat WBC ordered for tomorrow, discussed with patient  LOS: 1 days A FACE TO FACE EVALUATION WAS PERFORMED  Tamyka Bezio P Asna Muldrow 10/07/2023, 1:01 PM

## 2023-10-07 NOTE — Plan of Care (Signed)
  Problem: RH Balance Goal: LTG Patient will maintain dynamic standing balance (PT) Description: LTG:  Patient will maintain dynamic standing balance with assistance during mobility activities (PT) Flowsheets (Taken 10/07/2023 1445) LTG: Pt will maintain dynamic standing balance during mobility activities with:: Independent with assistive device    Problem: Sit to Stand Goal: LTG:  Patient will perform sit to stand with assistance level (PT) Description: LTG:  Patient will perform sit to stand with assistance level (PT) Flowsheets (Taken 10/07/2023 1445) LTG: PT will perform sit to stand in preparation for functional mobility with assistance level: Independent with assistive device   Problem: RH Bed Mobility Goal: LTG Patient will perform bed mobility with assist (PT) Description: LTG: Patient will perform bed mobility with assistance, with/without cues (PT). Flowsheets (Taken 10/07/2023 1445) LTG: Pt will perform bed mobility with assistance level of: Independent with assistive device    Problem: RH Bed to Chair Transfers Goal: LTG Patient will perform bed/chair transfers w/assist (PT) Description: LTG: Patient will perform bed to chair transfers with assistance (PT). Flowsheets (Taken 10/07/2023 1445) LTG: Pt will perform Bed to Chair Transfers with assistance level: Independent with assistive device    Problem: RH Car Transfers Goal: LTG Patient will perform car transfers with assist (PT) Description: LTG: Patient will perform car transfers with assistance (PT). Flowsheets (Taken 10/07/2023 1445) LTG: Pt will perform car transfers with assist:: Independent with assistive device    Problem: RH Ambulation Goal: LTG Patient will ambulate in controlled environment (PT) Description: LTG: Patient will ambulate in a controlled environment, # of feet with assistance (PT). Flowsheets (Taken 10/07/2023 1445) LTG: Pt will ambulate in controlled environ  assist needed:: Independent with  assistive device LTG: Ambulation distance in controlled environment: 158ft Goal: LTG Patient will ambulate in home environment (PT) Description: LTG: Patient will ambulate in home environment, # of feet with assistance (PT). Flowsheets (Taken 10/07/2023 1445) LTG: Pt will ambulate in home environ  assist needed:: Independent with assistive device LTG: Ambulation distance in home environment: 79ft   Problem: RH Stairs Goal: LTG Patient will ambulate up and down stairs w/assist (PT) Description: LTG: Patient will ambulate up and down # of stairs with assistance (PT) Flowsheets (Taken 10/07/2023 1445) LTG: Pt will ambulate up/down stairs assist needed:: Independent with assistive device LTG: Pt will  ambulate up and down number of stairs: at least 4 with 2 hand rails

## 2023-10-07 NOTE — Progress Notes (Signed)
[facility]patient Rehabilitation Center Individual Statement of Services  Patient Name:  Melissa Wilkerson  Date:  10/07/2023  Welcome to the Inpatient Rehabilitation Center.  Our goal is to provide you with an individualized program based on your diagnosis and situation, designed to meet your specific needs.  With this comprehensive rehabilitation program, you will be expected to participate in at least 3 hours of rehabilitation therapies Monday-Friday, with modified therapy programming on the weekends.  Your rehabilitation program will include the following services:  Physical Therapy (PT), Occupational Therapy (OT), Speech Therapy (ST), 24 hour per day rehabilitation nursing, Therapeutic Recreaction (TR), Neuropsychology, Care Coordinator, Rehabilitation Medicine, Nutrition Services, and Pharmacy Services  Weekly team conferences will be held on Wednesday to discuss your progress.  Your Inpatient Rehabilitation Care Coordinator will talk with you frequently to get your input and to update you on team discussions.  Team conferences with you and your family in attendance may also be held.  Expected length of stay: 3-5 days  Overall anticipated outcome: mod/I-supervision  Depending on your progress and recovery, your program may change. Your Inpatient Rehabilitation Care Coordinator will coordinate services and will keep you informed of any changes. Your Inpatient Rehabilitation Care Coordinator's name and contact numbers are listed  below.  The following services may also be recommended but are not provided by the Inpatient Rehabilitation Center:  Driving Evaluations Home Health Rehabiltiation Services Outpatient Rehabilitation Services Vocational Rehabilitation   Arrangements will be made to provide these services after discharge if needed.  Arrangements include referral to agencies that provide these services.  Your insurance has been verified to be:  Clinical research associate primary doctor is:     Pertinent information will be shared with your doctor and your insurance company.  Inpatient Rehabilitation Care Coordinator:  Dossie Der, [place] [phone] or [phone]  Information discussed with and copy given to patient by: Lucy Chris, 10/07/2023, 10:00 AM

## 2023-10-07 NOTE — Evaluation (Signed)
Occupational Therapy Assessment and Plan  Patient Details  Name: Melissa Wilkerson MRN: [id number] Date of Birth: [birth year]  OT Diagnosis: ataxia and hemiplegia affecting dominant side Rehab Potential: Rehab Potential (ACUTE ONLY): Excellent ELOS: 3-5 days   Today's Date: 10/07/2023 OT Individual Time: 7829-5621 OT Individual Time Calculation (min): 75 min     Hospital Problem: Principal Problem:   CVA (cerebral vascular accident) [facility])   Past Medical History:  Past Medical History:  Diagnosis Date   Abdominal bloating 05/26/2015   Breast nodule 03/31/2015   Dyspareunia 05/26/2015   Essential hypertension, benign 09/28/2019   HLD (hyperlipidemia) 09/28/2019   Hypothyroidism, adult 09/28/2019   Vitamin D deficiency disease 09/28/2019   Past Surgical History: History reviewed. No pertinent surgical history.  Assessment & Plan Clinical Impression:   Patient currently requires min with basic self-care skills and IADL secondary to muscle weakness and muscle paralysis, decreased cardiorespiratoy endurance, impaired timing and sequencing, unbalanced muscle activation, and decreased coordination, and decreased standing balance, decreased postural control, hemiplegia, and decreased balance strategies.  Prior to hospitalization, patient could complete all aspects of ADL/IADL/work and driving with independent  level.   Patient will benefit from skilled intervention to decrease level of assist with basic self-care skills, increase independence with basic self-care skills, and increase level of independence with iADL prior to discharge home with care partner.  Anticipate patient will require intermittent supervision and follow up outpatient.  OT - End of Session Activity Tolerance: Decreased this session Endurance Deficit: Yes Endurance Deficit Description: rests between sessions OT Assessment Rehab Potential (ACUTE ONLY): Excellent OT Barriers to Discharge: Home environment access/layout OT  Barriers to Discharge Comments: Tub shower and 2 STE OT Patient demonstrates impairments in the following area(s): Balance;Endurance;Motor;Sensory OT Basic ADL's Functional Problem(s): Grooming;Bathing;Dressing;Toileting OT Advanced ADL's Functional Problem(s): Simple Meal Preparation;Laundry;Light Housekeeping OT Transfers Functional Problem(s): Toilet;Tub/Shower OT Plan OT Intensity: Minimum of 1-2 x/day, 45 to 90 minutes OT Frequency: 5 out of 7 days OT Duration/Estimated Length of Stay: 3-5 days OT Treatment/Interventions: Balance/vestibular training;Community reintegration;Disease mangement/prevention;Functional electrical stimulation;Neuromuscular re-education;Patient/family education;Self Care/advanced ADL retraining;Splinting/orthotics;Therapeutic Exercise;UE/LE Coordination activities;Wheelchair propulsion/positioning;Cognitive remediation/compensation;Discharge planning;DME/adaptive equipment instruction;Functional mobility training;Pain management;Psychosocial support;Skin care/wound managment;Therapeutic Activities;UE/LE Strength taining/ROM;Visual/perceptual remediation/compensation OT Self Feeding Anticipated Outcome(s): mod I OT Basic Self-Care Anticipated Outcome(s): mod I OT Toileting Anticipated Outcome(s): mod I OT Bathroom Transfers Anticipated Outcome(s): mod I OT Recommendation Patient destination: Home Follow Up Recommendations: Outpatient OT Equipment Recommended: Tub/shower bench Equipment Details: tub shower seat   OT Evaluation Precautions/Restrictions  Precautions Precautions: Fall Precaution Comments: ataxia Restrictions Weight Bearing Restrictions: No  Pain Pain Assessment Pain Scale: 0-10 Pain Score: 0-No pain Home Living/Prior Functioning Home Living Family/patient expects to be discharged to:: Private residence Living Arrangements: Spouse/significant other, Children Available Help at Discharge: Family, Other (Comment), Available 24 hours/day Type  of Home: House Home Access: Level entry Home Layout: One level Bathroom Shower/Tub: Tub/shower unit, Engineer, building services: Standard  Lives With: Spouse, Son Prior Function Level of Independence: Independent with transfers, Independent with basic ADLs, Independent with gait  Able to Take Stairs?: Yes Driving: Yes Vocation: Full time employment Vision Baseline Vision/History: 1 Wears glasses Ability to See in Adequate Light: 0 Adequate Patient Visual Report: No change from baseline Vision Assessment?: No apparent visual deficits Perception  Perception: Within Functional Limits Praxis Praxis: WFL Cognition Cognition Overall Cognitive Status: Within Functional Limits for tasks assessed Arousal/Alertness: Awake/alert Memory: Appears intact Awareness: Appears intact Problem Solving: Appears intact Safety/Judgment: Appears intact Brief Interview for Mental Status (BIMS) Repetition of Three  Words (First Attempt): 3 Temporal Orientation: Year: Correct Temporal Orientation: Month: Accurate within 5 days Temporal Orientation: Day: Correct Recall: "Sock": Yes, no cue required Recall: "Blue": Yes, no cue required Recall: "Bed": Yes, no cue required BIMS Summary Score: 15 Sensation Sensation Light Touch: Impaired by gross assessment Hot/Cold: Impaired by gross assessment Proprioception: Impaired by gross assessment Stereognosis: Appears Intact Additional Comments: reports L arm sensory impairments and R face sensory deficits Coordination Gross Motor Movements are Fluid and Coordinated: No Fine Motor Movements are Fluid and Coordinated: No Coordination and Movement Description: ataxia Finger Nose Finger Test: mildly slowed R hand Heel Shin Test: ataxic on R 9 Hole Peg Test: 20 sec L 23 sec R Motor  Motor Motor: Ataxia  Trunk/Postural Assessment  Cervical Assessment Cervical Assessment: Within Functional Limits Thoracic Assessment Thoracic Assessment: Within Functional  Limits Lumbar Assessment Lumbar Assessment: Within Functional Limits Postural Control Postural Control: Within Functional Limits  Balance Balance Balance Assessed: Yes Standardized Balance Assessment Standardized Balance Assessment: Berg Balance Test Berg Balance Test Sit to Stand: Able to stand without using hands and stabilize independently Standing Unsupported: Able to stand 2 minutes with supervision Sitting with Back Unsupported but Feet Supported on Floor or Stool: Able to sit safely and securely 2 minutes Stand to Sit: Sits safely with minimal use of hands Transfers: Able to transfer safely, definite need of hands Standing Unsupported with Eyes Closed: Able to stand 10 seconds with supervision Standing Ubsupported with Feet Together: Able to place feet together independently and stand for 1 minute with supervision From Standing, Reach Forward with Outstretched Arm: Can reach confidently >25 cm (10") From Standing Position, Pick up Object from Floor: Able to pick up shoe, needs supervision From Standing Position, Turn to Look Behind Over each Shoulder: Looks behind from both sides and weight shifts well Turn 360 Degrees: Needs close supervision or verbal cueing Standing Unsupported, Alternately Place Feet on Step/Stool: Able to complete >2 steps/needs minimal assist Standing Unsupported, One Foot in Front: Able to take small step independently and hold 30 seconds Standing on One Leg: Unable to try or needs assist to prevent fall Total Score: 39 Extremity/Trunk Assessment RUE Assessment RUE Assessment: Within Functional Limits General Strength Comments: 60 lbs grip with very mild impact LUE Assessment LUE Assessment: Within Functional Limits General Strength Comments: 60 lbs grip  Care Tool Care Tool Self Care Eating   Eating Assist Level: Set up assist    Oral Care    Oral Care Assist Level: Set up assist    Bathing   Body parts bathed by patient: Right arm;Left  arm;Chest;Abdomen;Face;Front perineal area;Buttocks;Right upper leg;Left upper leg;Right lower leg;Left lower leg     Assist Level: Contact Guard/Touching assist    Upper Body Dressing(including orthotics)   What is the patient wearing?: Bra;Pull over shirt   Assist Level: Supervision/Verbal cueing    Lower Body Dressing (excluding footwear)   What is the patient wearing?: Underwear/pull up;Pants Assist for lower body dressing: Contact Guard/Touching assist    Putting on/Taking off footwear   What is the patient wearing?: Shoes;Socks Assist for footwear: Contact Guard/Touching assist       Care Tool Toileting Toileting activity   Assist for toileting: Set up assist     Care Tool Bed Mobility Roll left and right activity   Roll left and right assist level: Independent    Sit to lying activity   Sit to lying assist level: Supervision/Verbal cueing    Lying to sitting on side of bed  activity   Lying to sitting on side of bed assist level: the ability to move from lying on the back to sitting on the side of the bed with no back support.: Supervision/Verbal cueing     Care Tool Transfers Sit to stand transfer   Sit to stand assist level: Supervision/Verbal cueing    Chair/bed transfer   Chair/bed transfer assist level: Contact Guard/Touching assist     Toilet transfer   Assist Level: Contact Guard/Touching assist     Care Tool Cognition  Expression of Ideas and Wants Expression of Ideas and Wants: 4. Without difficulty (complex and basic) - expresses complex messages without difficulty and with speech that is clear and easy to understand  Understanding Verbal and Non-Verbal Content Understanding Verbal and Non-Verbal Content: 4. Understands (complex and basic) - clear comprehension without cues or repetitions   Memory/Recall Ability Memory/Recall Ability : Current season;That he or she is in a hospital/hospital unit   Refer to Care Plan for Long Term Goals  SHORT  TERM GOAL WEEK 1 OT Short Term Goal 1 (Week 1): STG=LTG's based on LOS  Recommendations for other services: None    Skilled Therapeutic Intervention ADL ADL Eating: Set up Where Assessed-Eating: Edge of bed Grooming: Setup Where Assessed-Grooming: Sitting at sink Upper Body Bathing: Setup Where Assessed-Upper Body Bathing: Sitting at sink Lower Body Bathing: Contact guard Where Assessed-Lower Body Bathing: Standing at sink;Sitting at sink Upper Body Dressing: Setup Where Assessed-Upper Body Dressing: Sitting at sink Lower Body Dressing: Contact guard Where Assessed-Lower Body Dressing: Standing at sink;Sitting at sink Toileting: Supervision/safety Where Assessed-Toileting: Teacher, adult education: Furniture conservator/restorer Method: Proofreader: Grab bars Tub/Shower Transfer: Scientific laboratory technician Method: Ship broker: Information systems manager with back Film/video editor: Administrator, arts Method: Designer, industrial/product: Information systems manager with back ADL Comments: CGA fading to close S for all ADL and bathroom transfers Mobility  Bed Mobility Bed Mobility: Supine to Sit;Sit to Supine Supine to Sit: Supervision/Verbal cueing Sit to Supine: Supervision/Verbal cueing Transfers Sit to Stand: Supervision/Verbal cueing Stand to Sit: Supervision/Verbal cueing  OT Treatment/Intervention:   Pt seen for full initial OT evaluation and training session this am. Pt in bed upon OT arrival. OT introduced role of therapy and purpose of session. Pt  open to all presented assessment and training this visit. Husband bedside and throughout session.  OT assisted and assessed ADL's, mobility, vision, sensation. cognition/lang, G/FMC, strength and balance throughout session. See above for levels. OT cleared husband for ADL and mobility in room including shower with use of gait belt. Staff and team aware. Pt will benefit  from short LOS for skilled OT services at [facility] to maximize function and safety with recommendation to return home with mod I with outpt OT services upon d/c home. Pt left at end of session at EOB, husband bedside, alarm set, tray table and nurse call bell within reach.   Discharge Criteria: Patient will be discharged from OT if patient refuses treatment 3 consecutive times without medical reason, if treatment goals not met, if there is a change in medical status, if patient makes no progress towards goals or if patient is discharged from hospital.  The above assessment, treatment plan, treatment alternatives and goals were discussed and mutually agreed upon: by patient and by family  Vicenta Dunning 10/07/2023, 4:23 PM

## 2023-10-08 LAB — CBC WITH DIFFERENTIAL/PLATELET
Abs Immature Granulocytes: 0.09 10*3/uL — ABNORMAL HIGH (ref 0.00–0.07)
Basophils Absolute: 0.1 10*3/uL (ref 0.0–0.1)
Basophils Relative: 1 %
Eosinophils Absolute: 0.4 10*3/uL (ref 0.0–0.5)
Eosinophils Relative: 3 %
HCT: 46.4 % — ABNORMAL HIGH (ref 36.0–46.0)
Hemoglobin: 16.4 g/dL — ABNORMAL HIGH (ref 12.0–15.0)
Immature Granulocytes: 1 %
Lymphocytes Relative: 22 %
Lymphs Abs: 3.3 10*3/uL (ref 0.7–4.0)
MCH: 31.7 pg (ref 26.0–34.0)
MCHC: 35.3 g/dL (ref 30.0–36.0)
MCV: 89.7 fL (ref 80.0–100.0)
Monocytes Absolute: 0.8 10*3/uL (ref 0.1–1.0)
Monocytes Relative: 5 %
Neutro Abs: 10.4 10*3/uL — ABNORMAL HIGH (ref 1.7–7.7)
Neutrophils Relative %: 68 %
Platelets: 322 10*3/uL (ref 150–400)
RBC: 5.17 MIL/uL — ABNORMAL HIGH (ref 3.87–5.11)
RDW: 12.1 % (ref 11.5–15.5)
WBC: 15.1 10*3/uL — ABNORMAL HIGH (ref 4.0–10.5)
nRBC: 0 % (ref 0.0–0.2)

## 2023-10-08 LAB — GLUCOSE, CAPILLARY
Glucose-Capillary: 153 mg/dL — ABNORMAL HIGH (ref 70–99)
Glucose-Capillary: 126 mg/dL — ABNORMAL HIGH (ref 70–99)
Glucose-Capillary: 155 mg/dL — ABNORMAL HIGH (ref 70–99)
Glucose-Capillary: 184 mg/dL — ABNORMAL HIGH (ref 70–99)

## 2023-10-08 MED ORDER — METFORMIN HCL ER 750 MG PO TB24
750.0000 mg | ORAL_TABLET | Freq: Every day | ORAL | Status: DC
  Administered 2023-10-08: 750 mg via ORAL
  Filled 2023-10-08 (×2): qty 1

## 2023-10-08 MED ORDER — MAGNESIUM GLUCONATE 500 MG PO TABS
500.0000 mg | ORAL_TABLET | Freq: Every day | ORAL | Status: DC
  Administered 2023-10-08: 500 mg via ORAL
  Filled 2023-10-08: qty 1

## 2023-10-08 NOTE — Progress Notes (Incomplete)
Inpatient Rehabilitation Care Coordinator Discharge Note   Patient Details  Name: Melissa Wilkerson MRN: [id number] Date of Birth: [birth year]   Discharge location: HOME WITH HUSBAND AND YOUNGER SON 24/7  Length of Stay: 3 DAYS  Discharge activity level: MOD/I LEVEL  Home/community participation: ACTIVE  Patient response NW:GNFAOZ Literacy - How often do you need to have someone help you when you read instructions, pamphlets, or other written material from your doctor or pharmacy?: Never  Patient response HY:QMVHQI Isolation - How often do you feel lonely or isolated from those around you?: Never  Services provided included: MD, RD, PT, OT, SLP, RN, CM, TR, Pharmacy, Neuropsych, SW  Financial Services:  Field seismologist Utilized: [employer]  Choices offered to/list presented to: PT AND HUSBAND  Follow-up services arranged:  Outpatient    Outpatient Servicies: [facility] OT WILL CALL TO SET UP FOLLOW UP APPOINTMENTS    HAS ROLLATOR AT HOME NO OTHER EQUIPMENT NEEDS  Patient response to transportation need: Is the patient able to respond to transportation needs?: Yes In the past 12 months, has lack of transportation kept you from medical appointments or from getting medications?: No In the past 12 months, has lack of transportation kept you from meetings, work, or from getting things needed for daily living?: No   Patient/Family verbalized understanding of follow-up arrangements:  Yes  Individual responsible for coordination of the follow-up plan: SELF AND BRIAN-HUSBAND [phone]  Confirmed correct DME delivered: Lucy Chris 10/08/2023    Comments (or additional information):HUSBAND WAS HERE DAILY AND WAS CHECKED OFF TO TRANSFER AND AMBULATE WITH WIFE IN HER ROOM. BOTH FEEL READY FOR DC HOME  Summary of Stay    Date/Time Discharge Planning CSW  10/08/23 [phone] Home with husband and younger son between if 24/7 needed will have this,  Husband has been here daily-short LOS due to high level RGD       Thurmond Hildebran, Lemar Livings

## 2023-10-08 NOTE — Plan of Care (Signed)

## 2023-10-08 NOTE — Progress Notes (Signed)
Inpatient Rehabilitation Discharge Medication Review by a Pharmacist  A complete drug regimen review was completed for this patient to identify any potential clinically significant medication issues.  High Risk Drug Classes Is patient taking? Indication by Medication  Antipsychotic No   Anticoagulant No   Antibiotic Yes Cephalexin - UTI  Opioid No   Antiplatelet Yes Asprin 81 mg and clopidogrel for 3 weeks, then aspirin alone  - stroke prophylaxis  Hypoglycemics/insulin Yes Metformin - glucose control  Vasoactive Medication Yes Amlodipine, losartan - hypertension  Chemotherapy No   Other Yes Atorvastatin - hyperlipidemia Magnesium, Elfolate Plus - supplement Acetaminophen - mild pain     Type of Medication Issue Identified Description of Issue Recommendation(s)  Drug Interaction(s) (clinically significant)     Duplicate Therapy     Allergy     No Medication Administration End Date     Incorrect Dose     Additional Drug Therapy Needed     Significant med changes from prior encounter (inform family/care partners about these prior to discharge). Only taking PRN acetaminophen prior to admission. All meds are new.    Other       Clinically significant medication issues were identified that warrant physician communication and completion of prescribed/recommended actions by midnight of the next day:  No  Pharmacist comments:   Time spent performing this drug regimen review (minutes):  15  Loura Back, PharmD, BCPS Clinical Pharmacist Clinical phone for 10/09/2023 is [phone] 10/09/2023 10:11 AM

## 2023-10-08 NOTE — Progress Notes (Addendum)
Occupational Therapy Discharge Summary  Patient Details  Name: Melissa Wilkerson MRN: [id number] Date of Birth: Jul 11, 1976  Date of Discharge from OT service:October 08, 2023  Today's Date: 10/08/2023 OT Individual Time: 0947-1100 and 2130-8657 OT Individual Time Calculation (min): 73 min and 49 min  AM Session:  Patient received seated at edge of bed - reports showering last night. Husband has been walking with her to the bathroom.  Patient agreeable to walk to gym with rollator to address balance and coordination.  Worked on management of RLE with walking/ slowing pace and addressing weight shift forward.  Patient with increased tension in RLE with increased speed initially, then leading to scissor like pattern.  With slight proprioceptive input at trunk able to improve step length, and functional walking - walking and tossing a ball, catching a ball, changing directions and sudden stops without loss of balance and without a device.  Left up in room with no alarms engaged - patient now at Modified independence in room. Reminded patient to use rollator for mobility when not in therapy.    PM Session:  Patient received seated at edge of bed. Father and son in room.  All very excited that patient is ready for discharge tomorrow.  Patient with questions about DM management and how often to check blood sugar.  Informed patient that nursing would provide that training.  Patient walked to gym with rollator and distant supervision. Continued work to walk without rollator - challenging balance by walking and tossing, walking backward, turning without changing stride, etc.  Completed discharge testing - improvement noted in fine motor coordination as evidenced by several second reduction in 9 hole peg test.  Patient walked back to room without rollator and close supervision to contact guard, son present.  Reminded patient that she should use the rollator in her room when walking by herself.  She has occasional  times where she catches/ scuffs her right foot.  Patient in agreement.     Patient has met 9 of 9 long term goals due to improved balance, ability to compensate for deficits, functional use of  RIGHT upper and RIGHT lower extremity, and improved coordination.  Patient to discharge at overall Modified Independent level.  Patient's care partner is independent to provide the necessary physical assistance at discharge.    Reasons goals not met: NA  Recommendation:  Patient will benefit from ongoing skilled OT services in outpatient setting to continue to advance functional skills in the area of BADL, iADL, Vocation, and Reduce care partner burden.  Equipment: No equipment provided  Reasons for discharge: treatment goals met and discharge from hospital  Patient/family agrees with progress made and goals achieved: Yes  OT Discharge Precautions/Restrictions  Precautions Precautions: Fall Precaution Comments: ataxia Restrictions Weight Bearing Restrictions: No  Vital Signs Therapy Vitals Temp: 97.9 F (36.6 C) Temp Source: Oral Pulse Rate: 72 Resp: 18 BP: 138/76 Patient Position (if appropriate): Sitting Oxygen Therapy SpO2: 96 % O2 Device: Room Air Pain am/pm Pain Assessment Pain Score: 0-No pain ADL ADL Eating: Independent Where Assessed-Eating: Edge of bed Grooming: Independent, Modified independent Where Assessed-Grooming: Standing at sink, Sitting at sink Upper Body Bathing: Independent, Modified independent Where Assessed-Upper Body Bathing: Shower Lower Body Bathing: Modified independent Where Assessed-Lower Body Bathing: Shower Upper Body Dressing: Independent, Modified independent (Device) Where Assessed-Upper Body Dressing: Edge of bed Lower Body Dressing: Modified independent Where Assessed-Lower Body Dressing: Edge of bed Toileting: Modified independent Where Assessed-Toileting: Teacher, adult education: Modified Community education officer Method:  Ambulating  Acupuncturist: Clinical biochemist Method: Ship broker: Information systems manager with back Film/video editor: Modified independent Film/video editor Method: Designer, industrial/product: Information systems manager with back ADL Comments: CGA fading to close S for all ADL and bathroom transfers Vision Baseline Vision/History: 1 Wears glasses Patient Visual Report: No change from baseline Vision Assessment?: No apparent visual deficits Perception  Perception: Within Functional Limits Praxis Praxis: WFL Cognition Cognition Overall Cognitive Status: Within Functional Limits for tasks assessed Arousal/Alertness: Awake/alert Memory: Appears intact Awareness: Appears intact Problem Solving: Appears intact Safety/Judgment: Appears intact Brief Interview for Mental Status (BIMS) Repetition of Three Words (First Attempt): 3 Temporal Orientation: Year: Correct Temporal Orientation: Month: Accurate within 5 days Temporal Orientation: Day: Correct Recall: "Sock": Yes, no cue required Recall: "Blue": Yes, no cue required Recall: "Bed": Yes, no cue required BIMS Summary Score: 15 Sensation Sensation Light Touch: Impaired by gross assessment Hot/Cold: Impaired by gross assessment Proprioception: Impaired by gross assessment Stereognosis: Appears Intact Additional Comments: reports L arm sensory impairments and R face sensory deficits Coordination Gross Motor Movements are Fluid and Coordinated: No Fine Motor Movements are Fluid and Coordinated: No Coordination and Movement Description: ataxia Finger Nose Finger Test: mild undershooting bilaterally 9 Hole Peg Test: 17.65 sec  19.49 sec Motor  Motor Motor: Ataxia Mobility  Bed Mobility Bed Mobility: Supine to Sit;Sit to Supine Supine to Sit: Independent Sit to Supine: Independent Transfers Sit to Stand: Independent Stand to Sit: Independent   Trunk/Postural Assessment  Cervical Assessment Cervical Assessment: Within Functional Limits Thoracic Assessment Thoracic Assessment: Within Functional Limits Lumbar Assessment Lumbar Assessment: Within Functional Limits Postural Control Postural Control: Within Functional Limits  Balance Balance Balance Assessed: Yes Static Sitting Balance Static Sitting - Balance Support: Feet supported;No upper extremity supported Static Sitting - Level of Assistance: 7: Independent Dynamic Sitting Balance Dynamic Sitting - Balance Support: Feet supported;No upper extremity supported Dynamic Sitting - Level of Assistance: 7: Independent Static Standing Balance Static Standing - Balance Support: No upper extremity supported;During functional activity Static Standing - Level of Assistance: 7: Independent Dynamic Standing Balance Dynamic Standing - Balance Support: No upper extremity supported;During functional activity Dynamic Standing - Level of Assistance: 7: Independent Extremity/Trunk Assessment RUE Assessment RUE Assessment: Within Functional Limits LUE Assessment LUE Assessment: Within Functional Limits General Strength Comments: 60 lbs grip   Merleen Milliner M 10/08/2023, 3:45 PM

## 2023-10-08 NOTE — Progress Notes (Signed)
Physical Therapy Session Note  Patient Details  Name: Melissa Wilkerson MRN: [id number] Date of Birth: [birth year]  Today's Date: 10/08/2023 PT Individual Time: 0800-0900 PT Individual Time Calculation (min): 60 min   Short Term Goals: Week 1:  PT Short Term Goal 1 (Week 1): STG = LTG  Skilled Therapeutic Interventions/Progress Updates:       Pt sitting EOB on arrival - husband, Arlys John, present throughout session. Pt has no c/o pain.   Sit<>Stand to RW with distant supervision. She ambulates at supervision level with the RW from her room to main rehab gym, ~139ft. Assisted onto Nustep and completed x10 minutes at L7 resistance using BUE/BLE - challenged to keep cadence at minimum 40spm. She achieved a total of 550 steps.   Retrieved a 4-wheeled walker (rollator) and adjusted this to fit. Educated patient on safety precautions and she voiced understanding. Gait training with rollator with supervision in hallway distances, >582ft. Practiced turning, sitting, and standing from rollator with it parked against wall for added safety to prevent it from rolling away.   Challenged patient with higher level balance training in // bars working with various compliant surfaces (rocker board, inverted bosu ball) while also challenge somatosensory with eyes open/closed, perturbations, semi-tandem stance, etc. Also able to challenge her with 3Kg med ball with shoulder press and chest press combo while standing on these surfaces. CGA/minA for balance while doing these tasks.   FGA completed with results described below. Educated patient on her falls risk and recommendation for using rollator for all mobility until improvement. Pt voiced understanding.  Patient demonstrates increased fall risk as noted by score of 15/30 on  Functional Gait Assessment.   <22/30 = predictive of falls, <20/30 = fall in 6 months, <18/30 = predictive of falls in PD MCID: 5 points stroke population, 4 points geriatric  population (ANPTA Core Set of Outcome Measures for Adults with Neurologic Conditions, 2018)  Pt ambulated back to her room with distant supervision using the rollator, ~264ft. No LOB or knee buckling. Ended treatment sitting EOB with family present, all needs met.    Therapy Documentation Precautions:  Precautions Precautions: Fall Precaution Comments: ataxia Restrictions Weight Bearing Restrictions: No General:    Balance: Balance Balance Assessed: Yes Standardized Balance Assessment Standardized Balance Assessment: Functional Gait Assessment Functional Gait  Assessment Gait assessed : Yes Gait Level Surface: Walks 20 ft in less than 7 sec but greater than 5.5 sec, uses assistive device, slower speed, mild gait deviations, or deviates 6-10 in outside of the 12 in walkway width. Change in Gait Speed: Able to change speed, demonstrates mild gait deviations, deviates 6-10 in outside of the 12 in walkway width, or no gait deviations, unable to achieve a major change in velocity, or uses a change in velocity, or uses an assistive device. Gait with Horizontal Head Turns: Performs head turns smoothly with slight change in gait velocity (eg, minor disruption to smooth gait path), deviates 6-10 in outside 12 in walkway width, or uses an assistive device. Gait with Vertical Head Turns: Performs task with slight change in gait velocity (eg, minor disruption to smooth gait path), deviates 6 - 10 in outside 12 in walkway width or uses assistive device Gait and Pivot Turn: Cannot turn safely, requires assistance to turn and stop. Step Over Obstacle: Is able to step over one shoe box (4.5 in total height) but must slow down and adjust steps to clear box safely. May require verbal cueing. Gait with Narrow Base of Support: Ambulates 7-9  steps. Gait with Eyes Closed: Walks 20 ft, slow speed, abnormal gait pattern, evidence for imbalance, deviates 10-15 in outside 12 in walkway width. Requires more than  9 sec to ambulate 20 ft. Ambulating Backwards: Walks 20 ft, slow speed, abnormal gait pattern, evidence for imbalance, deviates 10-15 in outside 12 in walkway width. Steps: Alternating feet, must use rail. Total Score: 15   Therapy/Group: Individual Therapy  Orrin Brigham 10/08/2023, 8:51 AM

## 2023-10-08 NOTE — Progress Notes (Signed)
Patient ID: Melissa Wilkerson, female   DOB: [birth year], 47 y.o.   MRN: [id number]  Met with pt and husband to give them the team conference update with goals of mod/I level and discharge tomorrow. Pt has done well and recovered quickly. Husband has been here the whole time and been checked off to transfer and ambulate with her in her room. Discussed OP therapy recommendations and will send referral to [facility] OP and have them contact pt or husband to set up follow up appointments. PCP arranged for follow up also. Pt has a rollator at home and no other equipment needs. She is still trying to find STD and FMLA paperwork from [employer]. Work on discharge tomorrow.

## 2023-10-08 NOTE — Patient Care Conference (Signed)
Inpatient RehabilitationTeam Conference and Plan of Care Update Date: 10/08/2023   Time: 11:09 AM    Patient Name: Melissa Wilkerson      Medical Record Number: [id number]  Date of Birth: 03-Mar-1976 Sex: Female         Room/Bed: 4W03C/4W03C-01 Payor Info: Payor: BLUE CROSS BLUE SHIELD / Plan: BCBS COMM PPO / Product Type: *No Product type* /    Admit Date/Time:  10/06/2023  1:21 PM  Primary Diagnosis:  CVA (cerebral vascular accident) [facility])  Hospital Problems: Principal Problem:   CVA (cerebral vascular accident) [facility])    Expected Discharge Date: Expected Discharge Date: 10/09/23  Team Members Present: Physician leading conference: Dr. Sula Soda Social Worker Present: Dossie Der, LCSW Nurse Present: Chana Bode, RN PT Present: Wynelle Link, PT OT Present: Bretta Bang, OT SLP Present: Jeannie Done, SLP PPS Coordinator present : Fae Pippin, SLP     Current Status/Progress Goal Weekly Team Focus  Bowel/Bladder   continent to bowel and bladder LBM 11/18    pt to remain continent   assist with toileting needs    Swallow/Nutrition/ Hydration   no SLP needed           ADL's   CGA fading to close S for all ADL and bathroom transfers   mod I   outpt OT, already cleared husband for in room ADL's and mobility, and family Educ for tub shower transfer and needs    Mobility   supervision bed mobility and transfers with RW, CGA to supervision for gait with RW. Ataxic gait with some mild sensory impairments. Strength intact. Impaired balance (BERG 39/56)   mod I  dynamic standing balance, dynamic gait, DC planning, pt and family ed    Communication   no SLP needed            Safety/Cognition/ Behavioral Observations  no SLP needed            Pain   pt denies pain   patient to remain pain free   assess as needed and PRn    Skin   skin intact, bruising to RUE   skin to remain intact  assess skin Q shift and PRN , maintan skin intergrity       Discharge Planning:  Home with husband and younger son between if 24/7 needed will have this, Husband has been here daily-short LOS due to high level   Team Discussion: Patient with ataxia post CVA.    Patient on target to meet rehab goals: yes, currently MOD I with ADLs and using a rollator.   *See Care Plan and progress notes for long and short-term goals.   Revisions to Treatment Plan:  N/A   Teaching Needs: Safety, medications, dietary modifications, transfers, toileting, etc.   Current Barriers to Discharge: Decreased caregiver support  Possible Resolutions to Barriers: Family education OP follow up services     Medical Summary Current Status: uncontrolled type 2 diabetes, dizziness, leukocytosis, HTN, obesity  Barriers to Discharge: Morbid Obesity;Medical stability  Barriers to Discharge Comments: uncontrolled type 2 diabetes, dizziness, leukocytosis, HTN, obesity Possible Resolutions to [employer] Focus: added long acting metformin with dinner, encouraged adequate hydration, repeat CBC tomorrow, continue to monitor BP TID, repeat WBC tomorrow   Continued Need for Acute Rehabilitation Level of Care: The patient requires daily medical management by a physician with specialized training in physical medicine and rehabilitation for the following reasons: Direction of a multidisciplinary physical rehabilitation program to maximize functional independence : Yes Medical management  of patient stability for increased activity during participation in an intensive rehabilitation regime.: Yes Analysis of laboratory values and/or radiology reports with any subsequent need for medication adjustment and/or medical intervention. : Yes   I attest that I was present, lead the team conference, and concur with the assessment and plan of the team.   Chana Bode B 10/08/2023, 3:13 PM

## 2023-10-08 NOTE — Progress Notes (Signed)
Physical Therapy Discharge Summary  Patient Details  Name: Melissa Wilkerson MRN: [id number] Date of Birth: 05/24/1976  Date of Discharge from PT service:October 08, 2023  Patient has met 8 of 8 long term goals due to improved activity tolerance, improved balance, increased strength, ability to compensate for deficits, functional use of  left lower extremity, improved attention, and improved awareness.  Patient to discharge at an ambulatory level Modified Independent.   Patient's care partner is independent to provide the necessary physical assistance at discharge.  Reasons goals not met: n/a  Functional Outcome Measures: FGA 15/30 115ft  5xSTS 16s  TUG 18s  BERG 39/56  ABC confidence 65%    Recommendation:  Patient will benefit from ongoing skilled PT services in outpatient setting to continue to advance safe functional mobility, address ongoing impairments in ataxia, dynamic balance, dynamic gait, and minimize fall risk.  Equipment: No equipment provided - pt owns rollator.   Reasons for discharge: treatment goals met and discharge from hospital  Patient/family agrees with progress made and goals achieved: Yes  PT Discharge Precautions/Restrictions Precautions Precautions: Fall Precaution Comments: ataxia Restrictions Weight Bearing Restrictions: No Pain Interference Pain Interference Pain Effect on Sleep: 0. Does not apply - I have not had any pain or hurting in the past 5 days Pain Interference with Therapy Activities: 0. Does not apply - I have not received rehabilitationtherapy in the past 5 days Pain Interference with Day-to-Day Activities: 1. Rarely or not at all Vision/Perception  Vision - History Ability to See in Adequate Light: 0 Adequate Perception Perception: Within Functional Limits Praxis Praxis: WFL  Cognition Overall Cognitive Status: Within Functional Limits for tasks assessed Arousal/Alertness: Awake/alert Orientation Level: Oriented  X4 Memory: Appears intact Awareness: Appears intact Problem Solving: Appears intact Safety/Judgment: Appears intact Sensation Sensation Light Touch: Impaired by gross assessment Hot/Cold: Impaired by gross assessment Proprioception: Impaired by gross assessment Stereognosis: Appears Intact Additional Comments: reports L arm sensory impairments and R face sensory deficits Coordination Gross Motor Movements are Fluid and Coordinated: No Fine Motor Movements are Fluid and Coordinated: No Coordination and Movement Description: ataxia Heel Shin Test: ataxic on R Motor  Motor Motor: Ataxia  Mobility Bed Mobility Bed Mobility: Supine to Sit;Sit to Supine Supine to Sit: Independent Sit to Supine: Independent Transfers Transfers: Sit to Stand;Stand Pivot Transfers;Stand to Sit Sit to Stand: Independent Stand to Sit: Independent Stand Pivot Transfers: Independent with assistive device Transfer (Assistive device): Rollator Locomotion  Gait Ambulation: Yes Gait Assistance: Independent with assistive device Gait Distance (Feet): 1150 Feet ( ) Assistive device: Rollator Gait Gait: Yes Gait Pattern: Impaired Gait Pattern: Step-through pattern;Ataxic Stairs / Additional Locomotion Stairs: Yes Stairs Assistance: Independent with assistive device Stair Management Technique: Two rails;Alternating pattern;Forwards Number of Stairs: 12 Height of Stairs: 6 Pick up small object from the floor assist level: Independent with assistive device Wheelchair Mobility Wheelchair Mobility: No  Trunk/Postural Assessment  Cervical Assessment Cervical Assessment: Within Functional Limits Thoracic Assessment Thoracic Assessment: Within Functional Limits Lumbar Assessment Lumbar Assessment: Within Functional Limits Postural Control Postural Control: Within Functional Limits  Balance Balance Balance Assessed: Yes Standardized Balance Assessment Standardized Balance Assessment: Functional  Gait Assessment Static Sitting Balance Static Sitting - Balance Support: Feet supported;No upper extremity supported Dynamic Sitting Balance Dynamic Sitting - Balance Support: Feet supported;No upper extremity supported Dynamic Sitting - Level of Assistance: 7: Independent Static Standing Balance Static Standing - Balance Support: No upper extremity supported Static Standing - Level of Assistance: 7: Independent Dynamic Standing Balance Dynamic Standing -  Balance Support: Bilateral upper extremity supported;During functional activity Dynamic Standing - Level of Assistance: 6: Modified independent (Device/Increase time) Functional Gait  Assessment Gait assessed : Yes Gait Level Surface: Walks 20 ft in less than 7 sec but greater than 5.5 sec, uses assistive device, slower speed, mild gait deviations, or deviates 6-10 in outside of the 12 in walkway width. Change in Gait Speed: Able to change speed, demonstrates mild gait deviations, deviates 6-10 in outside of the 12 in walkway width, or no gait deviations, unable to achieve a major change in velocity, or uses a change in velocity, or uses an assistive device. Gait with Horizontal Head Turns: Performs head turns smoothly with slight change in gait velocity (eg, minor disruption to smooth gait path), deviates 6-10 in outside 12 in walkway width, or uses an assistive device. Gait with Vertical Head Turns: Performs task with slight change in gait velocity (eg, minor disruption to smooth gait path), deviates 6 - 10 in outside 12 in walkway width or uses assistive device Gait and Pivot Turn: Cannot turn safely, requires assistance to turn and stop. Step Over Obstacle: Is able to step over one shoe box (4.5 in total height) but must slow down and adjust steps to clear box safely. May require verbal cueing. Gait with Narrow Base of Support: Ambulates 7-9 steps. Gait with Eyes Closed: Walks 20 ft, slow speed, abnormal gait pattern, evidence for  imbalance, deviates 10-15 in outside 12 in walkway width. Requires more than 9 sec to ambulate 20 ft. Ambulating Backwards: Walks 20 ft, slow speed, abnormal gait pattern, evidence for imbalance, deviates 10-15 in outside 12 in walkway width. Steps: Alternating feet, must use rail. Total Score: 15 Extremity Assessment      RLE Assessment RLE Assessment: Within Functional Limits LLE Assessment LLE Assessment: Within Functional Limits   Edword Cu P Taisia Fantini  PT, DPT, CSRS  10/08/2023, 11:21 AM

## 2023-10-09 ENCOUNTER — Other Ambulatory Visit (HOSPITAL_COMMUNITY): Payer: Self-pay

## 2023-10-09 DIAGNOSIS — I639 Cerebral infarction, unspecified: Secondary | ICD-10-CM | POA: Diagnosis not present

## 2023-10-09 LAB — GLUCOSE, CAPILLARY: Glucose-Capillary: 146 mg/dL — ABNORMAL HIGH (ref 70–99)

## 2023-10-09 LAB — URINE CULTURE: Culture: >=100000 — AB

## 2023-10-09 MED ORDER — MAGNESIUM OXIDE 400 MG PO TABS
400.0000 mg | ORAL_TABLET | Freq: Two times a day (BID) | ORAL | 0 refills | Status: DC
  Filled 2023-10-09: qty 60, 30d supply, fill #0

## 2023-10-09 MED ORDER — ONETOUCH DELICA PLUS LANCET33G MISC
0 refills | Status: AC
  Filled 2023-10-09: qty 100, 25d supply, fill #0

## 2023-10-09 MED ORDER — METFORMIN HCL ER 750 MG PO TB24
750.0000 mg | ORAL_TABLET | Freq: Every day | ORAL | 0 refills | Status: DC
  Filled 2023-10-09: qty 30, 30d supply, fill #0

## 2023-10-09 MED ORDER — CEPHALEXIN 500 MG PO CAPS
500.0000 mg | ORAL_CAPSULE | Freq: Two times a day (BID) | ORAL | 0 refills | Status: AC
  Filled 2023-10-09: qty 10, 5d supply, fill #0

## 2023-10-09 MED ORDER — LOSARTAN POTASSIUM 25 MG PO TABS
25.0000 mg | ORAL_TABLET | Freq: Every day | ORAL | 0 refills | Status: DC
  Filled 2023-10-09: qty 30, 30d supply, fill #0

## 2023-10-09 MED ORDER — ATORVASTATIN CALCIUM 40 MG PO TABS
40.0000 mg | ORAL_TABLET | Freq: Every day | ORAL | 0 refills | Status: DC
  Filled 2023-10-09: qty 30, 30d supply, fill #0

## 2023-10-09 MED ORDER — BLOOD GLUCOSE METER KIT
PACK | 0 refills | Status: AC
  Filled 2023-10-09 (×2): qty 1, 30d supply, fill #0

## 2023-10-09 MED ORDER — GLUCOSE BLOOD VI STRP
ORAL_STRIP | 0 refills | Status: AC
  Filled 2023-10-09: qty 100, 25d supply, fill #0

## 2023-10-09 MED ORDER — CEPHALEXIN 250 MG PO CAPS
500.0000 mg | ORAL_CAPSULE | Freq: Two times a day (BID) | ORAL | Status: DC
  Administered 2023-10-09: 500 mg via ORAL
  Filled 2023-10-09: qty 2

## 2023-10-09 MED ORDER — ASPIRIN 81 MG PO TBEC
81.0000 mg | DELAYED_RELEASE_TABLET | Freq: Every day | ORAL | 0 refills | Status: DC
  Filled 2023-10-09: qty 30, 30d supply, fill #0

## 2023-10-09 MED ORDER — AMLODIPINE BESYLATE 5 MG PO TABS
5.0000 mg | ORAL_TABLET | Freq: Every day | ORAL | 0 refills | Status: DC
  Filled 2023-10-09: qty 30, 30d supply, fill #0

## 2023-10-09 MED ORDER — ELFOLATE PLUS 3-35-2 MG PO TABS
1.0000 | ORAL_TABLET | Freq: Every day | ORAL | 0 refills | Status: DC
  Filled 2023-10-09: qty 30, 30d supply, fill #0

## 2023-10-09 MED ORDER — CLOPIDOGREL BISULFATE 75 MG PO TABS
75.0000 mg | ORAL_TABLET | Freq: Every day | ORAL | 0 refills | Status: DC
  Filled 2023-10-09: qty 13, 13d supply, fill #0

## 2023-10-09 MED ORDER — ACETAMINOPHEN 325 MG PO TABS: 325.0000 mg | ORAL_TABLET | ORAL | Status: AC | PRN

## 2023-10-09 NOTE — Progress Notes (Signed)
PROGRESS NOTE   Subjective/Complaints: No new complaints this morning Discussed UC is positive for >100,000 E coli, pharm consulted to start antibiotic, discussed with PA as well  ROS: +urinary frequency  Objective:   No results found. Recent Labs    10/07/23 0615 10/08/23 0704  WBC 14.0* 15.1*  HGB 15.6* 16.4*  HCT 45.3 46.4*  PLT 298 322   Recent Labs    10/07/23 0615  NA 136  K 4.1  CL 102  CO2 23  GLUCOSE 150*  BUN 20  CREATININE 0.55  CALCIUM 9.5    Intake/Output Summary (Last 24 hours) at 10/09/2023 0931 Last data filed at 10/08/2023 2100 Gross per 24 hour  Intake 916 ml  Output --  Net 916 ml        Physical Exam: Vital Signs Blood pressure (!) 147/70, pulse 67, temperature 97.8 F (36.6 C), resp. rate 18, SpO2 97%. Gen: no distress, normal appearing HEENT: oral mucosa pink and moist, NCAT Cardio: Reg rate Chest: normal effort, normal rate of breathing Abd: soft, non-distended Ext: no edema Psych: pleasant, normal affect Skin: intact Motor strength is 5/5 in bilateral deltoid, biceps, triceps, finger flexors and extensors, wrist flexors and extensors,LEFT hip flexors, knee flexors and extensors, ankle dorsiflexors, plantar flexors, invertors and evertors, toe flexors and extensors 4/5 Right HF, KE, ADF APF invertors and evertors, exam improved 11/20   Sensory exam is normal to pinprick, proprioception and light touch in the upper and lower limbs    Cranial nerves II- Visual fields are intact to confrontation testing, no blurring of vision III- no evidence of ptosis, upward, downward and medial gaze intact IV- no vertical diplopia or head tilt V- no facial numbness or masseter weakness VI- no pupil abduction weakness VII- no facial droop, good lid closure VII- normal auditory acuity IX- no pharygeal weakness, gag nl X- no pharyngeal weakness, no hoarseness XI- no trap or SCM  weakness XII- no glossal weakness     Assessment/Plan: 1. Functional deficits which require 3+ hours per day of interdisciplinary therapy in a comprehensive inpatient rehab setting. Physiatrist is providing close team supervision and 24 hour management of active medical problems listed below. Physiatrist and rehab team continue to assess barriers to discharge/monitor patient progress toward functional and medical goals  Care Tool:  Bathing    Body parts bathed by patient: Right arm, Left arm, Chest, Abdomen, Face, Front perineal area, Buttocks, Right upper leg, Left upper leg, Right lower leg, Left lower leg         Bathing assist Assist Level: Independent with assistive device     Upper Body Dressing/Undressing Upper body dressing   What is the patient wearing?: Bra, Pull over shirt    Upper body assist Assist Level: Independent with assistive device    Lower Body Dressing/Undressing Lower body dressing      What is the patient wearing?: Underwear/pull up, Pants     Lower body assist Assist for lower body dressing: Independent with assitive device     Toileting Toileting    Toileting assist Assist for toileting: Independent with assistive device     Transfers Chair/bed transfer  Transfers assist  Chair/bed transfer assist level: Independent with assistive device Chair/bed transfer assistive device: Geologist, engineering   Ambulation assist      Assist level: Independent with assistive device Assistive device: Rollator Max distance: 1193ft   Walk 10 feet activity   Assist     Assist level: Independent with assistive device Assistive device: Rollator   Walk 50 feet activity   Assist    Assist level: Independent with assistive device Assistive device: Rollator    Walk 150 feet activity   Assist    Assist level: Independent with assistive device Assistive device: Rollator    Walk 10 feet on uneven surface   activity   Assist     Assist level: Independent with assistive device Assistive device: Rollator   Wheelchair     Assist Is the patient using a wheelchair?: No             Wheelchair 50 feet with 2 turns activity    Assist            Wheelchair 150 feet activity     Assist          Blood pressure (!) 147/70, pulse 67, temperature 97.8 F (36.6 C), resp. rate 18, SpO2 97%.  Medical Problem List and Plan: 1. Functional deficits secondary to right brainstem infarct 10/01/23             -patient may  shower             -ELOS/Goals: 3 days, discussed plan for d/c today              Vitamin D and metanx started, discussed alternative carried by [facility] pharmacy 2.  Antithrombotics: -DVT/anticoagulation:  Pharmaceutical: Lovenox             -antiplatelet therapy: Aspirin and Plavix for three weeks followed by aspirin alone   3. Pain Management: Tylenol as needed   4. Mood/Behavior/Sleep: LCSW to evaluate and provide emotional support             -antipsychotic agents: n/a   5. Neuropsych/cognition: This patient is capable of making decisions on her own behalf.   6. Skin/Wound Care: Routine skin care checks   7. Fluids/Electrolytes/Nutrition: Routine Is and Os and follow-up chemistries   8: Hypertension: monitor TID and prn             -continue amlodipine 5 mg daily             -continue losartan 25 mg daily             -add magnesium gluconate 250mg  HS, increase to 500mg  HS   9: Hyperlipidemia: continue statin   10: DM-2: CBGs x QID; A1c = 7.4% (diet controlled at home)             -continue SSI, metformin started, discussed that she has experienced no adverse symptoms with this   11: Tobacco use: Cessation counseling   12. Dizziness: discussed importance of staying well hydrated   13. Luekocytosis: discussed likely 2/2 UTI  14. E coli UTI: consulted pharm to start abx  15. Headache: discussed tylenol is a safer medication option   >30  minutes spent in discharge of patient including review of medications and follow-up appointments, physical examination, and in answering all patient's questions   LOS: 3 days A FACE TO FACE EVALUATION WAS PERFORMED  Drema Pry Meliss Fleek 10/09/2023, 9:31 AM

## 2023-10-09 NOTE — IPOC Note (Signed)
Overall Plan of Care [facility]) Patient Details Name: Melissa Wilkerson MRN: [id number] DOB: 04/28/1976  Admitting Diagnosis: CVA (cerebral vascular accident) [facility])  Hospital Problems: Principal Problem:   CVA (cerebral vascular accident) [facility])     Functional Problem List: Nursing Safety, Endurance, Medication Management  PT Balance, Endurance, Motor, Safety, Sensory  OT Balance, Endurance, Motor, Sensory  SLP    TR         Basic ADL's: OT Grooming, Bathing, Dressing, Toileting     Advanced  ADL's: OT Simple Meal Preparation, Laundry, Light Housekeeping     Transfers: PT Bed Mobility, Bed to Chair, Customer service manager, Tub/Shower     Locomotion: PT Ambulation, Stairs     Additional Impairments: OT    SLP        TR      Anticipated Outcomes Item Anticipated Outcome  Self Feeding mod I  Swallowing      Basic self-care  mod I  Toileting  mod I   Bathroom Transfers mod I  Bowel/Bladder  n/a  Transfers  mod I  Locomotion  mod I  Communication     Cognition     Pain  n/a  Safety/Judgment  manage w cues   Therapy Plan: PT Intensity: Minimum of 1-2 x/day ,45 to 90 minutes PT Frequency: 5 out of 7 days PT Duration Estimated Length of Stay: 3-5 days OT Intensity: Minimum of 1-2 x/day, 45 to 90 minutes OT Frequency: 5 out of 7 days OT Duration/Estimated Length of Stay: 3-5 days     Team Interventions: Nursing Interventions Patient/Family Education, Disease Management/Prevention, Discharge Planning, Medication Management  PT interventions Ambulation/gait training, Discharge planning, DME/adaptive equipment instruction, Functional mobility training, Psychosocial support, Splinting/orthotics, Therapeutic Activities, UE/LE Strength taining/ROM, UE/LE Coordination activities, Therapeutic Exercise, Stair training, Patient/family education, Neuromuscular re-education, Disease management/prevention, Functional electrical stimulation, Community reintegration,  Warden/ranger  OT Interventions Warden/ranger, Community reintegration, Disease mangement/prevention, Development worker, international aid stimulation, Neuromuscular re-education, Patient/family education, Self Care/advanced ADL retraining, Splinting/orthotics, Therapeutic Exercise, UE/LE Coordination activities, Wheelchair propulsion/positioning, Cognitive remediation/compensation, Discharge planning, DME/adaptive equipment instruction, Functional mobility training, Pain management, Psychosocial support, Skin care/wound managment, Therapeutic Activities, UE/LE Strength taining/ROM, Visual/perceptual remediation/compensation  SLP Interventions    TR Interventions    SW/CM Interventions Discharge Planning, Psychosocial Support, Patient/Family Education   Barriers to Discharge MD  Medical stability  Nursing Decreased caregiver support, Home environment access/layout 2 level main B+B, 2ste no rail w spouse  Futures trader for SNF coverage    OT Home environment access/layout Tub shower and 2 STE  SLP      SW Medication compliance     Team Discharge Planning: Destination: PT-Home ,OT- Home , SLP-Home Projected Follow-up: PT-Outpatient PT, 24 hour supervision/assistance, OT-  Outpatient OT, SLP-None Projected Equipment Needs: PT-To be determined, OT- Tub/shower bench, SLP-None recommended by SLP Equipment Details: PT- , OT-tub shower seat Patient/family involved in discharge planning: PT- Patient, Family Adult nurse,  OT-Patient, Family member/caregiver, SLP-Patient, Family member/caregiver  MD ELOS: 3 days Medical Rehab Prognosis:  Excellent Assessment: The patient has been admitted for CIR therapies with the diagnosis of right brainstem infarct. The team will be addressing functional mobility, strength, stamina, balance, safety, adaptive techniques and equipment, self-care, bowel and bladder mgt, patient and caregiver education. Goals have been set at modI. Anticipated  discharge destination is home.        See Team Conference Notes for weekly updates to the plan of care

## 2023-10-13 ENCOUNTER — Telehealth: Payer: Self-pay

## 2023-10-13 LAB — FACTOR 5 LEIDEN

## 2023-10-13 NOTE — Transitions of Care (Post Inpatient/ED Visit) (Signed)
   10/13/2023  Name: Melissa Wilkerson MRN: [id number] DOB: 09/18/1976  Today's TOC FU Call Status: Today's TOC FU Call Status:: Successful TOC FU Call Completed TOC FU Call Complete Date: 10/13/23 Patient's Name and Date of Birth confirmed.  Transition Care Management Follow-up Telephone Call Date of Discharge: 10/09/23 Discharge Facility: [facility]) Type of Discharge: Inpatient Admission Primary Inpatient Discharge Diagnosis:: CVA- inpatient rehab How have you been since you were released from the hospital?: Better Any questions or concerns?: No  Items Reviewed: Did you receive and understand the discharge instructions provided?: Yes Medications obtained,verified, and reconciled?: Partial Review Completed Reason for Partial Mediation Review: She said she has all medications and did not have any questions about the med regime and did not need to review the med list.  She also confirmed that she has a glucometer Any new allergies since your discharge?: No Dietary orders reviewed?: Yes Type of Diet Ordered:: heart healthy, low sodium Do you have support at home?: Yes People in Home: spouse Name of Support/Comfort Primary Source: her husband and son are with her  Medications Reviewed Today: Medications Reviewed Today   Medications were not reviewed in this encounter     Home Care and Equipment/Supplies: Were Home Health Services Ordered?: No (She has been referred to outpatient PT/OT at [facility]) Any new equipment or medical supplies ordered?: No  Functional Questionnaire: Do you need assistance with bathing/showering or dressing?: Yes (She said she just needs helping getting over the tub and into the shower.) Do you need assistance with meal preparation?: Yes (her husband assists) Do you need assistance with eating?: No Do you have difficulty maintaining continence: No Do you need assistance with getting out of bed/getting out of a chair/moving?: Yes (She has a rollator  for ambulation) Do you have difficulty managing or taking your medications?: No  Follow up appointments reviewed: PCP Follow-up appointment confirmed?: Yes Date of PCP follow-up appointment?: 10/21/23 Follow-up Provider: Gwinda Passe, NP Specialist Hospital Follow-up appointment confirmed?: Yes Date of Specialist follow-up appointment?: 10/20/23 Follow-Up Specialty Provider:: PMR, 12/08/2022 - neurology. Do you need transportation to your follow-up appointment?: No Do you understand care options if your condition(s) worsen?: Yes-patient verbalized understanding    SIGNATURE Robyne Peers, RN

## 2023-10-14 ENCOUNTER — Other Ambulatory Visit (HOSPITAL_COMMUNITY): Payer: Self-pay

## 2023-10-20 ENCOUNTER — Telehealth (INDEPENDENT_AMBULATORY_CARE_PROVIDER_SITE_OTHER): Payer: Self-pay | Admitting: Primary Care

## 2023-10-20 ENCOUNTER — Encounter: Payer: BC Managed Care – PPO | Attending: Registered Nurse | Admitting: Registered Nurse

## 2023-10-20 ENCOUNTER — Encounter: Payer: Self-pay | Admitting: Registered Nurse

## 2023-10-20 VITALS — BP 121/71 | HR 92 | Ht 62.0 in | Wt 167.0 lb

## 2023-10-20 DIAGNOSIS — I639 Cerebral infarction, unspecified: Secondary | ICD-10-CM | POA: Diagnosis not present

## 2023-10-20 DIAGNOSIS — E7849 Other hyperlipidemia: Secondary | ICD-10-CM | POA: Diagnosis not present

## 2023-10-20 DIAGNOSIS — I10 Essential (primary) hypertension: Secondary | ICD-10-CM | POA: Insufficient documentation

## 2023-10-20 NOTE — Progress Notes (Unsigned)
Subjective:    Patient ID: Melissa Wilkerson, female    DOB: [birth year], 47 y.o.   MRN: [id number]  HPI: Melissa Wilkerson is a 47 y.o. female who returns for follow up appointment for chronic pain and medication refill. states *** pain is located in  ***. rates pain ***. current exercise regime is walking and performing stretching exercises.     Pain Inventory Average Pain 3 Pain Right Now 0 My pain is intermittent and dull  LOCATION OF PAIN  right neck, lower back  BOWEL Number of stools per week: 20 per per week Oral laxative use No  Type of laxative none Enema or suppository use No   BLADDER Pads Leakage with coughing Yes     Mobility walk without assistance use a walker ability to climb steps?  yes do you drive?  no  Function what is your job? Applied for FMLA  I need assistance with the following:  meal prep, household duties, and shopping Do you have any goals in this area?  yes  Neuro/Psych numbness trouble walking dizziness  Prior Studies Any changes since last visit?  no  Physicians involved in your care Any changes since last visit?  no   Family History  Problem Relation Age of Onset   Cancer Mother        lung   Diabetes Mother    Hypertension Mother    Cancer Father        throat, tongue   Diabetes Maternal Grandmother    Hypertension Maternal Grandmother    Cancer Paternal Grandfather    Social History   Socioeconomic History   Marital status: Married    Spouse name: Not on file   Number of children: Not on file   Years of education: Not on file   Highest education level: Not on file  Occupational History   Not on file  Tobacco Use   Smoking status: Every Day    Current packs/day: 1.00    Average packs/day: 1 pack/day for 17.0 years (17.0 ttl pk-yrs)    Types: Cigarettes   Smokeless tobacco: Never  Vaping Use   Vaping status: Former  Substance and Sexual Activity   Alcohol use: No    Alcohol/week: 0.0 standard drinks of  alcohol   Drug use: No   Sexual activity: Yes    Birth control/protection: Coitus interruptus  Other Topics Concern   Not on file  Social History Narrative   Married for 15 years.Works at [employer] shift.   Social Determinants of Health   Financial Resource Strain: Medium Risk (12/01/2020)   Overall Financial Resource Strain (CARDIA)    Difficulty of Paying Living Expenses: Somewhat hard  Food Insecurity: Patient Declined (10/01/2023)   Hunger Vital Sign    Worried About Running Out of Food in the Last Year: Patient declined    Ran Out of Food in the Last Year: Patient declined  Transportation Needs: Patient Declined (10/01/2023)   PRAPARE - Administrator, Civil Service (Medical): Patient declined    Lack of Transportation (Non-Medical): Patient declined  Physical Activity: Inactive (12/01/2020)   Exercise Vital Sign    Days of Exercise per Week: 0 days    Minutes of Exercise per Session: 0 min  Stress: Stress Concern Present (12/01/2020)   [employer] of Occupational Health - Occupational Stress Questionnaire    Feeling of Stress : To some extent  Social Connections: Moderately Integrated (12/01/2020)   Social Connection and Isolation Panel [NHANES]  Frequency of Communication with Friends and Family: More than three times a week    Frequency of Social Gatherings with Friends and Family: Once a week    Attends Religious Services: 1 to 4 times per year    Active Member of [employer] or Organizations: No    Attends Banker Meetings: Never    Marital Status: Married   No past surgical history on file. Past Medical History:  Diagnosis Date   Abdominal bloating 05/26/2015   Breast nodule 03/31/2015   Dyspareunia 05/26/2015   Essential hypertension, benign 09/28/2019   HLD (hyperlipidemia) 09/28/2019   Hypothyroidism, adult 09/28/2019   Vitamin D deficiency disease 09/28/2019   There were no vitals taken for this visit.  Opioid Risk Score:    Fall Risk Score:  `1  Depression screen [facility] 2/9     12/01/2020   11:04 AM 09/20/2020   10:49 AM 02/09/2020    9:44 AM 09/28/2019    9:23 AM  Depression screen PHQ 2/9  Decreased Interest 0 0 0 0  Down, Depressed, Hopeless 1 0 0 0  PHQ - 2 Score 1 0 0 0  Altered sleeping 2 0    Tired, decreased energy 1 0    Change in appetite 1 0    Feeling bad or failure about yourself  0 0    Trouble concentrating 0 0    Moving slowly or fidgety/restless 0 0    Suicidal thoughts 0 0    PHQ-9 Score 5 0    Difficult doing work/chores  Not difficult at all      Review of Systems  Musculoskeletal:  Positive for gait problem.  Neurological:  Positive for light-headedness and numbness (Coldness in the left arm & left hand).       Off balance  All other systems reviewed and are negative.      Objective:   Physical Exam        Assessment & Plan:

## 2023-10-20 NOTE — Telephone Encounter (Signed)
 Called pt to confirm apt. Pt will be present.

## 2023-10-21 ENCOUNTER — Encounter (INDEPENDENT_AMBULATORY_CARE_PROVIDER_SITE_OTHER): Payer: Self-pay | Admitting: Primary Care

## 2023-10-21 ENCOUNTER — Ambulatory Visit (HOSPITAL_COMMUNITY): Payer: BC Managed Care – PPO | Attending: Physician Assistant

## 2023-10-21 ENCOUNTER — Other Ambulatory Visit: Payer: Self-pay

## 2023-10-21 ENCOUNTER — Ambulatory Visit (INDEPENDENT_AMBULATORY_CARE_PROVIDER_SITE_OTHER): Payer: BC Managed Care – PPO | Admitting: Primary Care

## 2023-10-21 VITALS — BP 130/64 | HR 89 | Resp 16 | Ht 61.0 in | Wt 165.4 lb

## 2023-10-21 DIAGNOSIS — Z1211 Encounter for screening for malignant neoplasm of colon: Secondary | ICD-10-CM

## 2023-10-21 DIAGNOSIS — Z2821 Immunization not carried out because of patient refusal: Secondary | ICD-10-CM

## 2023-10-21 DIAGNOSIS — I639 Cerebral infarction, unspecified: Secondary | ICD-10-CM | POA: Insufficient documentation

## 2023-10-21 DIAGNOSIS — Z1231 Encounter for screening mammogram for malignant neoplasm of breast: Secondary | ICD-10-CM

## 2023-10-21 DIAGNOSIS — Z7984 Long term (current) use of oral hypoglycemic drugs: Secondary | ICD-10-CM | POA: Diagnosis not present

## 2023-10-21 DIAGNOSIS — Z7689 Persons encountering health services in other specified circumstances: Secondary | ICD-10-CM

## 2023-10-21 DIAGNOSIS — R262 Difficulty in walking, not elsewhere classified: Secondary | ICD-10-CM | POA: Diagnosis not present

## 2023-10-21 DIAGNOSIS — E1165 Type 2 diabetes mellitus with hyperglycemia: Secondary | ICD-10-CM

## 2023-10-21 DIAGNOSIS — R29818 Other symptoms and signs involving the nervous system: Secondary | ICD-10-CM | POA: Diagnosis not present

## 2023-10-21 DIAGNOSIS — Z7409 Other reduced mobility: Secondary | ICD-10-CM | POA: Insufficient documentation

## 2023-10-21 DIAGNOSIS — Z1159 Encounter for screening for other viral diseases: Secondary | ICD-10-CM

## 2023-10-21 DIAGNOSIS — R278 Other lack of coordination: Secondary | ICD-10-CM | POA: Insufficient documentation

## 2023-10-21 DIAGNOSIS — Z09 Encounter for follow-up examination after completed treatment for conditions other than malignant neoplasm: Secondary | ICD-10-CM

## 2023-10-21 LAB — GLUCOSE, POCT (MANUAL RESULT ENTRY): POC Glucose: 128 mg/dL — AB (ref 70–99)

## 2023-10-21 MED ORDER — AMLODIPINE BESYLATE 5 MG PO TABS: 5.0000 mg | ORAL_TABLET | Freq: Every day | ORAL | 2 refills | Status: DC

## 2023-10-21 MED ORDER — ATORVASTATIN CALCIUM 40 MG PO TABS: 40.0000 mg | ORAL_TABLET | Freq: Every day | ORAL | 2 refills | Status: DC

## 2023-10-21 MED ORDER — METFORMIN HCL ER 750 MG PO TB24
750.0000 mg | ORAL_TABLET | Freq: Every day | ORAL | 2 refills | Status: DC
Start: 2023-10-21 — End: 2024-04-21

## 2023-10-21 MED ORDER — MAGNESIUM OXIDE 400 MG PO TABS
400.0000 mg | ORAL_TABLET | Freq: Two times a day (BID) | ORAL | 2 refills | Status: DC
Start: 2023-10-21 — End: 2024-04-21

## 2023-10-21 MED ORDER — LOSARTAN POTASSIUM 25 MG PO TABS: 25.0000 mg | ORAL_TABLET | Freq: Every day | ORAL | 2 refills | Status: DC

## 2023-10-21 MED ORDER — ASPIRIN 81 MG PO TBEC
81.0000 mg | DELAYED_RELEASE_TABLET | Freq: Every day | ORAL | 2 refills | Status: AC
Start: 2023-10-21 — End: ?

## 2023-10-21 NOTE — Therapy (Addendum)
OUTPATIENT PHYSICAL THERAPY NEURO EVALUATION   Patient Name: Melissa Wilkerson MRN: [id number] DOB:[birth year], 47 y.o., female Today's Date: 10/21/2023   PCP: Grayce Sessions, NP REFERRING PROVIDER: Charlton Amor, PA-C  END OF SESSION:  PT End of Session - 10/21/23 0726     Visit Number 1    Number of Visits 12    Date for PT Re-Evaluation 12/02/23    Authorization Type [employer] [place]    PT Start Time [phone]    PT Stop Time 0800    PT Time Calculation (min) 40 min    Activity Tolerance Patient tolerated treatment well    Behavior During Therapy [facility] for tasks assessed/performed             Past Medical History:  Diagnosis Date   Abdominal bloating 05/26/2015   Breast nodule 03/31/2015   Dyspareunia 05/26/2015   Essential hypertension, benign 09/28/2019   HLD (hyperlipidemia) 09/28/2019   Hypothyroidism, adult 09/28/2019   Vitamin D deficiency disease 09/28/2019   No past surgical history on file. Patient Active Problem List   Diagnosis Date Noted   CVA (cerebral vascular accident) (HCC) 10/06/2023   Controlled type 2 diabetes mellitus with hyperglycemia, without long-term current use of insulin (HCC) 10/04/2023   Acute ischemic stroke (HCC) 10/02/2023   Acute CVA (cerebrovascular accident) (HCC) 10/01/2023   Pre-diabetes 10/01/2023   Encounter for screening fecal occult blood testing 12/01/2020   Encounter for gynecological examination with Papanicolaou smear of cervix 12/01/2020   Tobacco abuse 12/01/2020   Cigarette nicotine dependence without complication 09/20/2020   Essential hypertension, benign 09/28/2019   Hypothyroidism, adult 09/28/2019   Vitamin D deficiency disease 09/28/2019   HLD (hyperlipidemia) 09/28/2019   Abdominal bloating 05/26/2015   Dyspareunia 05/26/2015   Breast nodule 03/31/2015    ONSET DATE: 10/01/2023  REFERRING DIAG: I63.9 (ICD-10-CM) - Cerebral infarction, unspecified  THERAPY DIAG:  Cerebrovascular accident (CVA),  unspecified mechanism (HCC)  Rationale for Evaluation and Treatment: Rehabilitation  SUBJECTIVE:                                                                                                                                                                                             SUBJECTIVE STATEMENT: Patient presents to the clinic post CVA on 10/01/23. She states standing up from her recliner and fell right and caught herself on the coffee table. Noticed right side weakness in UE, face and could tell something was off. Went to ED; discharged from CIR on 10/09/23. To date, patient has noticed balance problems when walking and turning. Not so much when standing. Prior to CVA she was completely  independent will all ADLs and worked as a Brewing technologist; currently trying to get on disability. Notices leaning to the right and listing that way when walking; has left sided numbness and doesn't feel pain on that side in UE or LE. Can feel touch but not the pain from pinch. Sometimes her left arm is goes cold. Patient is doing well with most ADLs and has her husband and son help when needed; currently not driving. Patient is seeing family med NP this afternoon for the first session.  Pt accompanied by: family member; son in lobby; didn't come back for evaluation.   PERTINENT HISTORY: Smoker, HTN  PAIN:  Are you having pain? No; has some LBP and neck pain but doesn't attribute that to the CVA.  PRECAUTIONS: None  RED FLAGS: None   WEIGHT BEARING RESTRICTIONS: No  FALLS: Has patient fallen in last 6 months? No  LIVING ENVIRONMENT: Lives with: lives with their family, lives with their spouse, and lives with their son Lives in: House/apartment Stairs: Yes: Internal: 1 steps; none and External: 1 steps; none Has following equipment at home: Walker - 4 wheeled  PLOF: Independent  PATIENT GOALS: Get back to work. Cleaning around the house.   OBJECTIVE:  Note: Objective measures were completed at  Evaluation unless otherwise noted.  DIAGNOSTIC FINDINGS:  10/01/23 MRI FINDINGS: Brain: Acute infarct in the right midbrain. Slight edema without mass effect. Age advanced T2/FLAIR hyperintensities in the white matter. No acute hemorrhage, mass lesion, midline shift or hydrocephalus. Vascular: Major arterial flow voids are maintained skull base. Skull and upper cervical spine: Normal marrow signal. Sinuses/Orbits: Negative.   COGNITION: Overall cognitive status: Within functional limits for tasks assessed   SENSATION: WFL  COORDINATION: Appears WFL for UE and LE   MUSCLE TONE: LLE: Within functional limits   POSTURE: rounded shoulders and forward head    UE and LE AROM grossly WNL throughout UE MMTs grossly WNL throughout  LOWER EXTREMITY ROM:     Active  Right Eval Left Eval  Hip flexion    Hip extension    Hip abduction    Hip adduction    Hip internal rotation    Hip external rotation    Knee flexion    Knee extension    Ankle dorsiflexion    Ankle plantarflexion    Ankle inversion    Ankle eversion     (Blank rows = not tested)  LOWER EXTREMITY MMT:    MMT Right Eval Left Eval  Hip flexion 4+ 4+  Hip extension    Hip abduction    Hip adduction    Hip internal rotation    Hip external rotation    Knee flexion (sitting) 4 4  Knee extension 5 5  Ankle dorsiflexion 4 5  Ankle plantarflexion    Ankle inversion    Ankle eversion    (Blank rows = not tested)  BED MOBILITY:  Patient reports no issues with bed mobility  TRANSFERS: Assistive device utilized: None  Sit to stand: Complete Independence Stand to sit: Complete Independence Chair to chair:  Not tested; no issue reported Floor:  Not tested; no issue reported  RAMP:  Level of Assistance: Modified independence Assistive device utilized: Walker - 4 wheeled and None Ramp Comments:   CURB:  Level of Assistance: SBA Assistive device utilized: None Curb Comments: Patient mentions having to  go slow and be intentional   GAIT: Gait pattern: WFL, step through pattern, and decreased stride length Distance walked: 500  feet in clinic Assistive device utilized: Walker - 4 wheeled Level of assistance: Modified independence Comments:   FUNCTIONAL TESTS:  5 times sit to stand: 12 second 2 minute walk test: 349 feet Single leg balance: Right: 4 sec; Left: 6 sec  PATIENT SURVEYS:  FOTO 59  TODAY'S TREATMENT:                                                                                                                              DATE:   10/21/2023: Initial Evaluation and initiated HEP  PATIENT EDUCATION: Education details: Patient educated on exam findings, POC, scope of PT, HEP, and PT prognosis. Person educated: Patient Education method: Explanation, Demonstration, and Handouts Education comprehension: verbalized understanding, returned demonstration, verbal cues required, and tactile cues required   HOME EXERCISE PROGRAM: Access Code: [email or web address]  URL: [email or web address]  Date: 10/21/2023  Prepared by: AP  Rehab Exercises  Sit to Stand  - 2 x daily - 7 x weekly - 1 sets - 10 reps  Standing Tandem Balance with Counter Support  - 2 x daily - 7 x weekly - 1 sets - 3 reps - 30 sec hold  Standing March with Counter Support  - 2 x daily - 7 x weekly - 2 sets - 10 reps    GOALS: Goals reviewed with patient? No  SHORT TERM GOALS: Target date: 11/11/2023  Patient will be independent with managing initial HEP to improve self management and self care.  Baseline: Goal status: INITIAL  2.  Patient will be able confident in standing and walking for >20 minutes without AD in order to tolerate grocery shopping. Baseline:  Goal status: INITIAL   LONG TERM GOALS: Target date: 12/02/2023  Patient will improve 2 MWT to 400 feet w/o AD in order to demonstrate improved functional ambulatory capacity in community setting and promote return to work. Baseline:   Goal status: INITIAL  2.  By discharge, patient will increase FOTO score by >10 points in order to improve functional capacity and tolerance to daily activity.  Baseline: 59 Goal status: INITIAL  3.  Patient will be able confident in standing and walking for >45 minutes without AD in order to tolerate demands of returning to work. Baseline:  Goal status: INITIAL  4.  Patient will demonstrate SLS > 15 sec bilaterally to improve balance and decrease risk of falls. Baseline: Right: 4 sec; Left: 6 sec Goal status: INITIAL  5.  Patient will improve R LE knee flexion and ankle dorsiflexion to 4+/5 to decrease risk of falls and improve functional strength.  Baseline:  Goal status: INITIAL  ASSESSMENT:  CLINICAL IMPRESSION: Patient is a 47 y.o. female who was seen today for physical therapy evaluation and treatment for a right midbrain infarction. 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 She's been able to navigate her home without the use of her RW but takes it  any time she's out in the community. Functional tests (5x STS and ) reveal good physical ability for an acute stroke. Patient has little difficulty with any transfers. She still notices a right pulsion which is slightly observed during and while seated during the evaluation. Single leg stance is more difficult on the right leg. SLS on both sides resulted in a LOB to the right. LE MMTs also revealed slight right-sided weakness compared to the left. Throughout session, patient's speech was slow but fluent, not slurred. No difficulty with cognition, processing questions and commands.   OBJECTIVE IMPAIRMENTS: decreased balance, dizziness, and obesity.   ACTIVITY LIMITATIONS: caring for others  PARTICIPATION LIMITATIONS: cleaning, laundry, driving, shopping, and occupation  PERSONAL FACTORS: Transportation and 1-2 comorbidities: HTN, smoker  are also affecting patient's  functional outcome.   REHAB POTENTIAL: Good  CLINICAL DECISION MAKING: Stable/uncomplicated  EVALUATION COMPLEXITY: Moderate  PLAN:  PT FREQUENCY: 1-2x/week  PT DURATION: 6 weeks  PLANNED INTERVENTIONS: 97164- PT Re-evaluation, 97110-Therapeutic exercises, 97530- Therapeutic activity, 97112- Neuromuscular re-education, 97535- Self Care, 16109- Manual therapy, [phone]- Gait training, Balance training, and Stair training  PLAN FOR NEXT SESSION: Review goals and initial HEP; functional strength and balance activities.    Chas Braycen Burandt, Student-PT 10/21/2023, 10:02 AM   " I agree with the following treatment note after reviewing documentation. This session was performed under the supervision of a licensed clinician."  10:02 AM, 10/21/23 Amy Small Lynch MPT [facility] [place] [phone] [phone]      Managed Blue Cross [facility] Authorization Request  Visit Dx Codes: I63.9 (ICD-10-CM) - Cerebral infarction, unspecified  Functional Tool Score: FOTO: 59  For all possible CPT codes, reference the Planned Interventions line above.     Check all conditions that are expected to impact treatment: {Conditions expected to impact treatment:Morbid obesity and Diabetes mellitus   If treatment provided at initial evaluation, no treatment charged due to lack of authorization.

## 2023-10-21 NOTE — Addendum Note (Signed)
Addended byBurnadette Peter, Tinleigh Whitmire S on: 10/21/2023 10:15 AM   Modules accepted: Orders

## 2023-10-21 NOTE — Progress Notes (Signed)
Subjective:   Melissa Wilkerson is a 47 y.o. female presents for hospital follow up and establish care.  Patient presented to the emergency room after her husband noticed slurred speech and weakness leaning to her right.  Admit date to the hospital was 10/01/23 at [facility],  for Acute CVA,  Debility secondary to CVA,  Hypertension,Hyperlipidemia,Diabetes mellitus type 2, Tobacco use, Bilateral pulmonary lung nodules,  Left ovarian cyst, Left adrenal nodule, UTI due to e. Coli, Leukocytosis . Transferred to [facility] cont rehab 10/09/23, patient was admitted for: CVA (cerebral vascular accident)  Raulkar, Drema Pry, MD (Physical Medicine and Rehabilitation.  Today she presents alert and oriented she has a rollator she is able to raise her arms, move her neck and arms still has unstable gait due to right leg weakness and frontal numbness right side of face. No sensation on the left side of body. Past Medical History:  Diagnosis Date   Abdominal bloating 05/26/2015   Breast nodule 03/31/2015   Dyspareunia 05/26/2015   Essential hypertension, benign 09/28/2019   HLD (hyperlipidemia) 09/28/2019   Hypothyroidism, adult 09/28/2019   Vitamin D deficiency disease 09/28/2019     No Known Allergies  Current Outpatient Medications on File Prior to Visit  Medication Sig Dispense Refill   acetaminophen (TYLENOL) 325 MG tablet Take 1-2 tablets (325-650 mg total) by mouth every 4 (four) hours as needed for mild pain (pain score 1-3).     amLODipine (NORVASC) 5 MG tablet Take 1 tablet (5 mg total) by mouth daily. 30 tablet 0   aspirin EC 81 MG tablet Take 1 tablet (81 mg total) by mouth daily. Swallow whole. 30 tablet 0   atorvastatin (LIPITOR) 40 MG tablet Take 1 tablet (40 mg total) by mouth daily. 30 tablet 0   blood glucose meter kit and supplies Use up to four times daily as directed 1 each 0   clopidogrel (PLAVIX) 75 MG tablet Take 1 tablet (75 mg total) by mouth daily for 13 days 13 tablet 0    glucose blood test strip Use up to four times a day as directed 100 each 0   L-Methylfolate-B6-B12 (ELFOLATE PLUS) 3-35-2 MG TABS Take 1 tablet by mouth daily at 2 PM. 30 tablet 0   Lancets (ONETOUCH DELICA PLUS LANCET33G) MISC Use up to four times a day as directed 100 each 0   losartan (COZAAR) 25 MG tablet Take 1 tablet (25 mg total) by mouth daily. 30 tablet 0   magnesium oxide (MAG-OX) 400 MG tablet Take 1 tablet (400 mg total) by mouth 2 (two) times daily. 60 tablet 0   metFORMIN (GLUCOPHAGE-XR) 750 MG 24 hr tablet Take 1 tablet (750 mg total) by mouth daily with supper. 30 tablet 0   No current facility-administered medications on file prior to visit.    Review of System:  Comprehensive ROS Pertinent positive and negative noted in HPI   Objective:  BP 130/64 (BP Location: Right Arm, Patient Position: Sitting, Cuff Size: Large)   Pulse 89   Resp 16   Ht 5\' 1"  (1.549 m)   Wt 165 lb 6.4 oz (75 kg)   LMP 10/19/2023   SpO2 96%   BMI 31.25 kg/m     Physical Exam Vitals reviewed.  HENT:     Head: Normocephalic.     Right Ear: Tympanic membrane, ear canal and external ear normal.     Left Ear: Tympanic membrane, ear canal and external ear normal.  Nose: Nose normal.     Mouth/Throat:     Mouth: Mucous membranes are moist.  Eyes:     Extraocular Movements: Extraocular movements intact.     Pupils: Pupils are equal, round, and reactive to light.  Cardiovascular:     Rate and Rhythm: Normal rate.  Pulmonary:     Effort: Pulmonary effort is normal.     Breath sounds: Normal breath sounds.  Abdominal:     General: Bowel sounds are normal.     Palpations: Abdomen is soft.  Musculoskeletal:     Cervical back: Normal range of motion.     Comments: Right side weakness and Left side loss of sensation touch feel, temp or pins Unstable gait uses a rollator   Skin:    General: Skin is warm and dry.  Neurological:     Mental Status: She is alert and oriented to person, place,  and time.  Psychiatric:        Mood and Affect: Mood normal.      Assessment:   Diagnoses and all orders for this visit:  Acute CVA (cerebrovascular accident) (HCC) -     Ambulatory referral to Neurology -     atorvastatin (LIPITOR) 40 MG tablet; Take 1 tablet (40 mg total) by mouth daily. -     magnesium oxide (MAG-OX) 400 MG tablet; Take 1 tablet (400 mg total) by mouth 2 (two) times daily. -     aspirin EC 81 MG tablet; Take 1 tablet (81 mg total) by mouth daily. Swallow whole.  Controlled type 2 diabetes mellitus with hyperglycemia, without long-term current use of insulin (HCC) -     POCT glucose (manual entry) -     Microalbumin / creatinine urine ratio -     Ambulatory referral to Ophthalmology -     metFORMIN (GLUCOPHAGE-XR) 750 MG 24 hr tablet; Take 1 tablet (750 mg total) by mouth daily with supper.  Encounter for screening mammogram for malignant neoplasm of breast -     MM DIGITAL SCREENING BILATERAL; Future  Screening for colon cancer -     Ambulatory referral to Gastroenterology  Encounter for hepatitis C screening test for low risk patient -     HCV Ab w Reflex to Quant PCR  Influenza vaccination declined  Hospital discharge follow-up See HPI scheduled neurology 10/22/23 @ 2:45 patient aware   Encounter to establish care   Other orders -     amLODipine (NORVASC) 5 MG tablet; Take 1 tablet (5 mg total) by mouth daily. -     losartan (COZAAR) 25 MG tablet; Take 1 tablet (25 mg total) by mouth daily.    This note has been created with Education officer, environmental. Any transcriptional errors are unintentional.   No follow-ups on file.  Grayce Sessions, NP 10/21/2023, 2:10 PM

## 2023-10-22 ENCOUNTER — Other Ambulatory Visit: Payer: Self-pay

## 2023-10-22 ENCOUNTER — Encounter: Payer: Self-pay | Admitting: Neurology

## 2023-10-22 ENCOUNTER — Encounter: Payer: Self-pay | Admitting: *Deleted

## 2023-10-22 ENCOUNTER — Ambulatory Visit (HOSPITAL_COMMUNITY): Payer: BC Managed Care – PPO | Admitting: Occupational Therapy

## 2023-10-22 ENCOUNTER — Ambulatory Visit (INDEPENDENT_AMBULATORY_CARE_PROVIDER_SITE_OTHER): Payer: BC Managed Care – PPO | Admitting: Neurology

## 2023-10-22 ENCOUNTER — Encounter (HOSPITAL_COMMUNITY): Payer: Self-pay | Admitting: Occupational Therapy

## 2023-10-22 VITALS — BP 133/67 | HR 96 | Ht 62.0 in | Wt 165.0 lb

## 2023-10-22 DIAGNOSIS — R262 Difficulty in walking, not elsewhere classified: Secondary | ICD-10-CM | POA: Diagnosis not present

## 2023-10-22 DIAGNOSIS — I639 Cerebral infarction, unspecified: Secondary | ICD-10-CM

## 2023-10-22 DIAGNOSIS — R278 Other lack of coordination: Secondary | ICD-10-CM | POA: Diagnosis not present

## 2023-10-22 DIAGNOSIS — Z7409 Other reduced mobility: Secondary | ICD-10-CM | POA: Diagnosis not present

## 2023-10-22 DIAGNOSIS — R29818 Other symptoms and signs involving the nervous system: Secondary | ICD-10-CM

## 2023-10-22 MED ORDER — ATORVASTATIN CALCIUM 80 MG PO TABS: 80.0000 mg | ORAL_TABLET | Freq: Every day | ORAL | 3 refills | Status: DC

## 2023-10-22 MED ORDER — CLOPIDOGREL BISULFATE 75 MG PO TABS: 75.0000 mg | ORAL_TABLET | Freq: Every day | ORAL | 0 refills | Status: AC

## 2023-10-22 NOTE — Progress Notes (Signed)
[facility]  PATIENT: Melissa Wilkerson DOB: Mar 27, 1976  REQUESTING CLINICIAN: Charlsie Quest, MD HISTORY FROM: Patient/Father and Chart review  REASON FOR VISIT: Left medullary stroke    HISTORICAL  CHIEF COMPLAINT:  Chief Complaint  Patient presents with   Hospitalization Follow-up    Rm12, dad present, Hospital f/u for acute ischemic stroke: unsteady gait (uses walker), don't feel pain or hot & cold left side of body, numb on right side of face    HISTORY OF PRESENT ILLNESS:  This is a 47 year old woman past medical history of hypertension, hyperlipidemia, diabetes mellitus who is presenting after being admitted to hospital for a left medullary stroke.  Patient initially presented to the hospital after waking up with right-sided weakness, MRI Brain showed a left medullary stroke, stroke etiology likely small vessel disease.  Her angiogram also showed intracranial atherosclerosis.  Patient was started on DAPT, aspirin and Plavix for total of 21 days and started on atorvastatin 40.  She was also recommended for inpatient acute rehab.  She completed rehab, discharged home with outpatient rehab.  She has followed up, was told that she does not need OT but still need PT about twice a week.  Since discharge from the hospital, her right lower extremity weakness is improving, she is having other sensory deficit, cannot feel sharp pain or hot or cold on the left side of her body but light touch is good, there is also right lower extremity weakness.  She uses a walker for ambulation, denies any recent fall.   Hospital Course and Summary: 47 y/o female with a history of hypertension, tobacco abuse, and diabetes presenting with numbness on her right face and right hand up to her elbow that began at 7 AM on 10/01/2023.  The patient states that she had some numbness in her right foot up to her right knee about a week prior to this admission lasting " a few minutes".  She states  that on and off for the past week she has noted that she has been " leaning to the right" with ambulation.  However she denies any frank leg weakness or falling.  She has had some dizziness but denies any visual disturbance, syncope, chest pain, shortness of breath.  She has not had any fevers, chills, nausea, vomiting or diarrhea, abdominal pain.  Spouse reported that the patient speech has been slow, but there has been no dysarthria.  Because of her right-sided numbness and dizziness, the patient presented for further evaluation and treatment. In the ED, the patient was afebrile and hemodynamically stable with oxygen saturation 99% room air.  WBC 9.6, hemoglobin 15.2, platelets 243.  Sodium 132, potassium 3.6, bicarbonate 22, serum creatinine 0.57.  LFTs unremarkable.  UA was negative for pyuria.  Urine drug screen was negative.  MRI brain showed an acute infarct in the right medulla.  CTA head and neck was negative for LVO.  Showed moderate to severe stenosis of the P2 PCA.  The patient was started on aspirin and Plavix.  She was admitted for further evaluation and treatment of her stroke. Neurology was consulted and recommended DAPT x 3 weeks followed by ASA 81 mg  daily.  CT CAP was ordered by neurology with results discussed below.   PT recommended CIR with which patient agreed.   Assessment and Plan:   Acute ischemic stroke -Appreciate Neurology Consult>>hypercoagulable work up ordered as well as CT CAP to look for occult malignancy -PT/OT evaluation>>CIR -Speech therapy eval appreciated -MRI brain--acute  infarct right medulla -CTA H&N--negative LVO.  Moderate severe stenosis P2 PCA, moderate bilateral paraclinoid ICA stenosis.  Severe right vertebral artery stenosis -Echo--EF 65-70%, not optimal to eval WMA, no PFO, trivial MR -LDL--unable to calculate secondary to elevated triglycerides -HbA1C--7.4 -Antiplatelet--ASA 81 mg and plavix 75 mg daily x 3 weeks, then ASA 81 mg daily -plan 16  more days of DAPT starting 10/07/23 and then ASA 81 mg daily -discussed +lupus anticoagulant with Dr. Azzie Roup change in therapy for now.  Keep outpt follow up with neurology after d/c   Essential hypertension -not on any medications as an outpatient -Allowing for permissive hypertension temporarily -Plan to start antihypertensive medications at time of discharge>>started amlodipine and losartan   Mixed hyperlipidemia -Unable to calculate LDL secondary to elevated triglycerides -Triglycerides 481 -Total cholesterol 241 -direct LDL 164 -started lipitor 40 mg daily    OTHER MEDICAL CONDITIONS: Hypertension, hyperlipidemia, Diabetes Mellitus    REVIEW OF SYSTEMS: Full 14 system review of systems performed and negative with exception of: As noted in the HPI   ALLERGIES: No Known Allergies  HOME MEDICATIONS: Outpatient Medications Prior to Visit  Medication Sig Dispense Refill   acetaminophen (TYLENOL) 325 MG tablet Take 1-2 tablets (325-650 mg total) by mouth every 4 (four) hours as needed for mild pain (pain score 1-3).     amLODipine (NORVASC) 5 MG tablet Take 1 tablet (5 mg total) by mouth daily. 30 tablet 2   aspirin EC 81 MG tablet Take 1 tablet (81 mg total) by mouth daily. Swallow whole. 30 tablet 2   blood glucose meter kit and supplies Use up to four times daily as directed 1 each 0   glucose blood test strip Use up to four times a day as directed 100 each 0   L-Methylfolate-B6-B12 (ELFOLATE PLUS) 3-35-2 MG TABS Take 1 tablet by mouth daily at 2 PM. 30 tablet 0   Lancets (ONETOUCH DELICA PLUS LANCET33G) MISC Use up to four times a day as directed 100 each 0   losartan (COZAAR) 25 MG tablet Take 1 tablet (25 mg total) by mouth daily. 30 tablet 2   magnesium oxide (MAG-OX) 400 MG tablet Take 1 tablet (400 mg total) by mouth 2 (two) times daily. 60 tablet 2   metFORMIN (GLUCOPHAGE-XR) 750 MG 24 hr tablet Take 1 tablet (750 mg total) by mouth daily with supper. 30 tablet 2    atorvastatin (LIPITOR) 40 MG tablet Take 1 tablet (40 mg total) by mouth daily. 30 tablet 2   clopidogrel (PLAVIX) 75 MG tablet Take 1 tablet (75 mg total) by mouth daily for 13 days 13 tablet 0   No facility-administered medications prior to visit.    PAST MEDICAL HISTORY: Past Medical History:  Diagnosis Date   Abdominal bloating 05/26/2015   Breast nodule 03/31/2015   Dyspareunia 05/26/2015   Essential hypertension, benign 09/28/2019   HLD (hyperlipidemia) 09/28/2019   Hypothyroidism, adult 09/28/2019   Vitamin D deficiency disease 09/28/2019    PAST SURGICAL HISTORY: History reviewed. No pertinent surgical history.  FAMILY HISTORY: Family History  Problem Relation Age of Onset   Cancer Mother        lung   Diabetes Mother    Hypertension Mother    Cancer Father        throat, tongue   Diabetes Maternal Grandmother    Hypertension Maternal Grandmother    Cancer Paternal Grandfather     SOCIAL HISTORY: Social History   Socioeconomic History   Marital status: Married  Spouse name: Not on file   Number of children: Not on file   Years of education: Not on file   Highest education level: Not on file  Occupational History   Not on file  Tobacco Use   Smoking status: Every Day    Current packs/day: 1.00    Average packs/day: 1 pack/day for 17.0 years (17.0 ttl pk-yrs)    Types: Cigarettes   Smokeless tobacco: Never  Vaping Use   Vaping status: Never Used  Substance and Sexual Activity   Alcohol use: No    Alcohol/week: 0.0 standard drinks of alcohol   Drug use: No   Sexual activity: Yes    Birth control/protection: Coitus interruptus  Other Topics Concern   Not on file  Social History Narrative   Married for 15 years.Works at [employer] shift.   Social Determinants of Health   Financial Resource Strain: Medium Risk (12/01/2020)   Overall Financial Resource Strain (CARDIA)    Difficulty of Paying Living Expenses: Somewhat hard  Food  Insecurity: No Food Insecurity (10/21/2023)   Hunger Vital Sign    Worried About Running Out of Food in the Last Year: Never true    Ran Out of Food in the Last Year: Never true  Transportation Needs: Patient Declined (10/01/2023)   PRAPARE - Transportation    Lack of Transportation (Medical): Patient declined    Lack of Transportation (Non-Medical): Patient declined  Physical Activity: Inactive (12/01/2020)   Exercise Vital Sign    Days of Exercise per Week: 0 days    Minutes of Exercise per Session: 0 min  Stress: Stress Concern Present (12/01/2020)   [employer] of Occupational Health - Occupational Stress Questionnaire    Feeling of Stress : To some extent  Social Connections: Moderately Integrated (12/01/2020)   Social Connection and Isolation Panel [NHANES]    Frequency of Communication with Friends and Family: More than three times a week    Frequency of Social Gatherings with Friends and Family: Once a week    Attends Religious Services: 1 to 4 times per year    Active Member of [employer] or Organizations: No    Attends Banker Meetings: Never    Marital Status: Married  Catering manager Violence: Not At Risk (10/21/2023)   Humiliation, Afraid, Rape, and Kick questionnaire    Fear of Current or Ex-Partner: No    Emotionally Abused: No    Physically Abused: No    Sexually Abused: No    PHYSICAL EXAM  GENERAL EXAM/CONSTITUTIONAL: Vitals:  Vitals:   10/22/23 1350  BP: 133/67  Pulse: 96  Weight: 165 lb (74.8 kg)  Height: 5\' 2"  (1.575 m)   Body mass index is 30.18 kg/m. Wt Readings from Last 3 Encounters:  10/22/23 165 lb (74.8 kg)  10/21/23 165 lb 6.4 oz (75 kg)  10/20/23 167 lb (75.8 kg)   Patient is in no distress; well developed, nourished and groomed; neck is supple  MUSCULOSKELETAL: Gait, strength, tone, movements noted in Neurologic exam below  NEUROLOGIC: MENTAL STATUS:      No data to display         awake, alert, oriented to person,  place and time recent and remote memory intact normal attention and concentration language fluent, comprehension intact, naming intact fund of knowledge appropriate  CRANIAL NERVE:  2nd, 3rd, 4th, 6th - Visual fields full to confrontation, extraocular muscles intact, no nystagmus 5th - facial sensation symmetric 7th - facial strength symmetric 8th - hearing intact  9th - palate elevates symmetrically, uvula midline 11th - shoulder shrug symmetric 12th - tongue protrusion midline  MOTOR:  normal bulk and tone, full strength in the BUE, and LLE  Right hip flexion 4/5, Knee extension and ankle dorsiflexion 4+/5  SENSORY:  Decrease pinprick and temperature in the LUE and LLE, normal light touch  COORDINATION:  finger-nose-finger, fine finger movements normal  REFLEXES:  deep tendon reflexes present and symmetric  GAIT/STATION:  Able to stand unassisted, hesitant gait, mild circumduction of the RLE. She does use a walker    DIAGNOSTIC DATA (LABS, IMAGING, TESTING) - I reviewed patient records, labs, notes, testing and imaging myself where available.  Lab Results  Component Value Date   WBC 15.1 (H) 10/08/2023   HGB 16.4 (H) 10/08/2023   HCT 46.4 (H) 10/08/2023   MCV 89.7 10/08/2023   PLT 322 10/08/2023      Component Value Date/Time   NA 136 10/07/2023 0615   NA 138 10/06/2018 1131   K 4.1 10/07/2023 0615   CL 102 10/07/2023 0615   CO2 23 10/07/2023 0615   GLUCOSE 150 (H) 10/07/2023 0615   BUN 20 10/07/2023 0615   CREATININE 0.55 10/07/2023 0615   CREATININE 0.69 03/13/2021 0000   CALCIUM 9.5 10/07/2023 0615   PROT 7.2 10/07/2023 0615   ALBUMIN 3.8 10/07/2023 0615   AST 24 10/07/2023 0615   ALT 33 10/07/2023 0615   ALKPHOS 84 10/07/2023 0615   BILITOT 0.9 10/07/2023 0615   GFRNONAA >60 10/07/2023 0615   GFRNONAA 107 03/13/2021 0000   GFRAA 124 03/13/2021 0000   Lab Results  Component Value Date   CHOL 241 (H) 10/02/2023   HDL 30 (L) 10/02/2023   LDLCALC  UNABLE TO CALCULATE IF TRIGLYCERIDE OVER 400 mg/dL 40/98/1191   LDLDIRECT 164 (H) 10/02/2023   TRIG 481 (H) 10/02/2023   CHOLHDL 8.0 10/02/2023   Lab Results  Component Value Date   HGBA1C 7.4 (H) 10/01/2023   No results found for: "VITAMINB12" Lab Results  Component Value Date   TSH 1.57 10/06/2018    MRI Brain 10/01/2023 1. Acute infarct in the right midbrain. 2. Moderate T2/FLAIR hyperintensities in the white matter, which are age advanced. These most likely represent accelerated chronic microvascular ischemic disease, but chronic demyelination could have a similar appearance  CTA Head and Neck 10/01/2023 1. No emergent large vessel occlusion. 2. The left PCA is small with suspected superimposed moderate to severe stenosis of the P2 PCA which is irregular and diminutive. 3. Moderate bilateral paraclinoid ICA stenosis. 4. Severe right vertebral artery origin stenosis.  Echo  1. Left ventricular ejection fraction, by estimation, is 65 to 70%. The left ventricle has normal function. Left ventricular endocardial border not optimally defined to evaluate regional wall motion. Left ventricular diastolic parameters were normal.  2. Right ventricular systolic function is normal. The right ventricular size is normal. Tricuspid regurgitation signal is inadequate for assessing PA pressure.  3. The mitral valve is grossly normal. Trivial mitral valve regurgitation.  4. The aortic valve was not well visualized. Aortic valve regurgitation is not visualized. Aortic valve mean gradient measures 6.0 mmHg.  5. The inferior vena cava is normal in size with <50% respiratory variability, suggesting right atrial pressure of 8 mmHg.    ASSESSMENT AND PLAN  47 y.o. year old female with vascular risk factors including hypertension, hyperlipidemia, diabetes mellitus who is presenting after a left medullary stroke.  Stroke etiology likely small vessel disease.  Due to her intracranial  atherosclerosis, we  will continue her DAPT, aspirin and Plavix for total of 90 days.  Will also increase her atorvastatin to 80 mg nightly with a LDL goal less than 70.  Advised her to continue with PT, and to do the exercises at home.  Currently she is out of work, informed patient that we will complete her short-term disability if needed.  Continue to follow with PCP and return in 1 year or sooner if worse.   1. Acute stroke of medulla oblongata (HCC)   2. Acute CVA (cerebrovascular accident) [facility])      Patient Instructions  Will continue with DAPT, aspirin and Plavix for total of 90 days due to intracranial atherosclerosis Increase atorvastatin to 80 mg nightly, goal of LDL less than 70 Continue physical therapy Continue your other medications Continue follow-up PCP Return in 1 year or sooner if worse. Will complete short-term disability form for patient.  No orders of the defined types were placed in this encounter.   Meds ordered this encounter  Medications   clopidogrel (PLAVIX) 75 MG tablet    Sig: Take 1 tablet (75 mg total) by mouth daily.    Dispense:  69 tablet    Refill:  0   atorvastatin (LIPITOR) 80 MG tablet    Sig: Take 1 tablet (80 mg total) by mouth daily.    Dispense:  90 tablet    Refill:  3    Return in about 1 year (around 10/21/2024).  I have spent a total of 45 minutes dedicated to this patient today, preparing to see patient, performing a medically appropriate examination and evaluation, ordering tests and/or medications and procedures, and counseling and educating the patient/family/caregiver; independently interpreting result and communicating results to the family/patient/caregiver; and documenting clinical information in the electronic medical record.   Windell Norfolk, MD 10/22/2023, 5:22 PM  [facility] [address] [place], [place] [address] [phone]

## 2023-10-22 NOTE — Patient Instructions (Signed)
Will continue with DAPT, aspirin and Plavix for total of 90 days due to intracranial atherosclerosis Increase atorvastatin to 80 mg nightly, goal of LDL less than 70 Continue physical therapy Continue your other medications Continue follow-up PCP Return in 1 year or sooner if worse. Will complete short-term disability form for patient.

## 2023-10-22 NOTE — Therapy (Unsigned)
OUTPATIENT OCCUPATIONAL THERAPY NEURO EVALUATION  Patient Name: Melissa Wilkerson MRN: [id number] DOB:22-Dec-1975, 47 y.o., female Today's Date: 10/23/2023  PCP: Gwinda Passe, NP REFERRING PROVIDER: Mariam Dollar, PA-C  END OF SESSION:   10/22/23 1230  OT Visits / Re-Eval  Visit Number 1  Number of Visits 1  Date for OT Re-Evaluation 10/22/23  Authorization  Authorization Type [employer]  OT Time Calculation  OT Start Time 1153  OT Stop Time 1230  OT Time Calculation (min) 37 min  End of Session  Activity Tolerance Patient tolerated treatment well  Behavior During Therapy [facility] for tasks assessed/performed    Past Medical History:  Diagnosis Date   Abdominal bloating 05/26/2015   Breast nodule 03/31/2015   Dyspareunia 05/26/2015   Essential hypertension, benign 09/28/2019   HLD (hyperlipidemia) 09/28/2019   Hypothyroidism, adult 09/28/2019   Vitamin D deficiency disease 09/28/2019   History reviewed. No pertinent surgical history. Patient Active Problem List   Diagnosis Date Noted   CVA (cerebral vascular accident) (HCC) 10/06/2023   Controlled type 2 diabetes mellitus with hyperglycemia, without long-term current use of insulin (HCC) 10/04/2023   Acute ischemic stroke (HCC) 10/02/2023   Acute CVA (cerebrovascular accident) (HCC) 10/01/2023   Pre-diabetes 10/01/2023   Encounter for screening fecal occult blood testing 12/01/2020   Encounter for gynecological examination with Papanicolaou smear of cervix 12/01/2020   Tobacco abuse 12/01/2020   Cigarette nicotine dependence without complication 09/20/2020   Essential hypertension, benign 09/28/2019   Hypothyroidism, adult 09/28/2019   Vitamin D deficiency disease 09/28/2019   HLD (hyperlipidemia) 09/28/2019   Abdominal bloating 05/26/2015   Dyspareunia 05/26/2015   Breast nodule 03/31/2015    ONSET DATE: 10/01/23  REFERRING DIAG: L CVA  THERAPY DIAG:  Other lack of coordination  Other symptoms and signs involving  the nervous system  Rationale for Evaluation and Treatment: Rehabilitation  SUBJECTIVE:   SUBJECTIVE STATEMENT: "My arms feel fine" Pt accompanied by: self  PERTINENT HISTORY: Patient presents to the clinic post CVA on 10/01/23. She states standing up from her recliner and fell right and caught herself on the coffee table. Noticed right side weakness in UE, face and could tell something was off. Went to ED; discharged from CIR on 10/09/23. To date, patient has noticed balance problems when walking and turning. Not so much when standing. Prior to CVA she was completely independent will all ADLs and worked as a Brewing technologist; currently trying to get on disability. Notices leaning to the right and listing that way when walking; has left sided numbness and doesn't feel pain on that side in UE or LE. Can feel touch but not the pain from pinch. Sometimes her left arm is goes cold. Patient is doing well with most ADLs and has her husband and son help when needed; currently not driving.   PRECAUTIONS: Fall  WEIGHT BEARING RESTRICTIONS: No  PAIN:  Are you having pain? No  FALLS: Has patient fallen in last 6 months? No  LIVING ENVIRONMENT: Lives with: lives with their family Lives in: House/apartment Stairs: Yes: Internal: 13 steps; on right going up and External: 1-2 steps; none Has following equipment at home: Walker - 4 wheeled, shower chair, bed side commode, and Grab bars  PLOF: Independent  PATIENT GOALS: "I want to go back to work"  OBJECTIVE:  Note: Objective measures were completed at Evaluation unless otherwise noted.  HAND DOMINANCE: Right  ADLs and IADLs: Overall ADLs: Balance is the biggest problem affecting her ability to complete standing ADL's and  IADL's.   MOBILITY STATUS: difficulty with turns, difficulty carrying objections with ambulation, and Heavily relies on Rollator  POSTURE COMMENTS:  rounded shoulders Sitting balance: Moves/returns truncal midpoint 1-2  inches in multiple planes  ACTIVITY TOLERANCE: Activity tolerance: Pt reports no difficulties  FUNCTIONAL OUTCOME MEASURES: FOTO: 91.72  UPPER EXTREMITY ROM:    BUE WFL  UPPER EXTREMITY MMT:     MMT Right eval  Shoulder flexion 5/5  Shoulder abduction 5/5  Shoulder adduction 5/5  Shoulder extension 5/5  Shoulder internal rotation 5/5  Shoulder external rotation 5/5  Elbow flexion 5/5  Elbow extension 5/5  Wrist flexion 5/5  Wrist extension 5/5  Wrist ulnar deviation 5/5  Wrist radial deviation 5/5  Wrist pronation 5/5  Wrist supination 5/5  (Blank rows = not tested)  HAND FUNCTION: Grip strength: Right: 80 lbs; Left: 65 lbs, Lateral pinch: Right: 18 lbs, Left: 16 lbs, and 3 point pinch: Right: 17 lbs, Left: 18 lbs  COORDINATION: 9 Hole Peg test: Right: 19.99 sec; Left: 20.46 sec  SENSATION: Pt reporting not able to feel hot/cold or pain on the L side, however she is able to feel touch.  EDEMA: No Swelling noted  COGNITION: Overall cognitive status: Within functional limits for tasks assessed  VISION: Subjective report: I have some wooziness now Baseline vision: No visual deficits Visual history:  No Concerns   TODAY'S TREATMENT:                                                                                                                              DATE: 10/23/23: Evaluation Only   PATIENT EDUCATION: Education details: UE strengthening Person educated: Patient Education method: Explanation Education comprehension: verbalized understanding  HOME EXERCISE PROGRAM: UE strengthening   ASSESSMENT:  CLINICAL IMPRESSION: Patient is a 47 y.o. female who was seen today for occupational therapy evaluation for CVA. Pt presents with decreased balance, left sided sensation deficits, and intermittent dizziness/wooziness. All ADL's are affected only due to poor balance.    PERFORMANCE DEFICITS: in functional skills including ADLs, IADLs, balance, and body  mechanics.  IMPAIRMENTS: are limiting patient from ADLs, IADLs, rest and sleep, work, leisure, and social participation.   CO-MORBIDITIES: has no other co-morbidities that affects occupational performance. Patient will benefit from skilled OT to address above impairments and improve overall function.  MODIFICATION OR ASSISTANCE TO COMPLETE EVALUATION: No modification of tasks or assist necessary to complete an evaluation.  OT OCCUPATIONAL PROFILE AND HISTORY: Problem focused assessment: Including review of records relating to presenting problem.  CLINICAL DECISION MAKING: LOW - limited treatment options, no task modification necessary  REHAB POTENTIAL: Good  EVALUATION COMPLEXITY: Low    PLAN:  OT FREQUENCY: one time visit  OT DURATION: other: Evaluation Only  PLANNED INTERVENTIONS:  N/A  RECOMMENDED OTHER SERVICES: PT  CONSULTED AND AGREED WITH PLAN OF CARE: Patient  PLAN FOR NEXT SESSION: Discharge - Evaluation Only   Umair Rosiles Bing Plume, OTR/L [facility]  [phone] Kennyth Arnold, OT 10/23/2023, 9:17 PM

## 2023-10-23 ENCOUNTER — Telehealth: Payer: Self-pay | Admitting: Neurology

## 2023-10-23 ENCOUNTER — Encounter: Payer: Self-pay | Admitting: Obstetrics & Gynecology

## 2023-10-23 ENCOUNTER — Ambulatory Visit: Payer: BC Managed Care – PPO | Admitting: Obstetrics & Gynecology

## 2023-10-23 VITALS — BP 140/77 | HR 97 | Ht 60.0 in | Wt 165.0 lb

## 2023-10-23 DIAGNOSIS — N83299 Other ovarian cyst, unspecified side: Secondary | ICD-10-CM | POA: Diagnosis not present

## 2023-10-23 NOTE — Progress Notes (Signed)
Follow up appointment for results: sonogram  Chief Complaint  Patient presents with   Follow-up   [id number] Patient's last menstrual period was 10/19/2023.  Blood pressure (!) 140/77, pulse 97, height 5' (1.524 m), weight 165 lb (74.8 kg), last menstrual period 10/19/2023.  Had CVA now on plavix, asked to see regarding sonogram findings  Narrative & Impression  CLINICAL DATA:  Left ovarian cyst on CT yesterday   EXAM: TRANSABDOMINAL AND TRANSVAGINAL ULTRASOUND OF PELVIS   TECHNIQUE: Both transabdominal and transvaginal ultrasound examinations of the pelvis were performed. Transabdominal technique was performed for global imaging of the pelvis including uterus, ovaries, adnexal regions, and pelvic cul-de-sac. It was necessary to proceed with endovaginal exam following the transabdominal exam to visualize the 10/02/2023.   COMPARISON:  10/02/2023   FINDINGS: Uterus   Measurements: 8.6 x 4.5 x 5.7 cm = volume: 115.5 mL. No fibroids or other mass visualized.   Endometrium   Thickness: 10 mm.  No focal abnormality visualized.   Right ovary   Measurements: 2.8 x 1.6 x 1.8 cm = volume: 4.1 mL. Normal appearance/no adnexal mass.   Left ovary   Measurements: 3.1 x 1.8 by 2.3 cm = volume: 6.5 mL. Normal follicles are seen within the left ovary, largest measuring 1.8 cm. No adnexal masses.   Other findings   Trace pelvic free fluid within the cul-de-sac and left adnexa, likely physiologic.   IMPRESSION: 1. Normal appearing follicles within the left ovary. No adnexal masses. 2. Trace pelvic free fluid, likely physiologic.     Electronically Signed   By: Sharlet Salina M.D.   On: 10/03/2023 16:42    MEDS ordered this encounter: No orders of the defined types were placed in this encounter.   Orders for this encounter: No orders of the defined types were placed in this encounter.   Impression + Management Plan   ICD-10-CM   1. Physiological ovarian cyst, left  ovary, menstruated 3 weeks later  N83.299    no follow up needed, menses may get heavier on plavix      Follow Up: Return if symptoms worsen or fail to improve.     All questions were answered.  Past Medical History:  Diagnosis Date   Abdominal bloating 05/26/2015   Breast nodule 03/31/2015   Dyspareunia 05/26/2015   Essential hypertension, benign 09/28/2019   HLD (hyperlipidemia) 09/28/2019   Hypothyroidism, adult 09/28/2019   Vitamin D deficiency disease 09/28/2019    History reviewed. No pertinent surgical history.  OB History     Gravida  3   Para  2   Term  2   Preterm      AB  1   Living  2      SAB  1   IAB      Ectopic      Multiple      Live Births  2           No Known Allergies  Social History   Socioeconomic History   Marital status: Married    Spouse name: Not on file   Number of children: Not on file   Years of education: Not on file   Highest education level: Not on file  Occupational History   Not on file  Tobacco Use   Smoking status: Every Day    Current packs/day: 1.00    Average packs/day: 1 pack/day for 17.0 years (17.0 ttl pk-yrs)    Types: Cigarettes   Smokeless tobacco: Never  Vaping  Use   Vaping status: Never Used  Substance and Sexual Activity   Alcohol use: No    Alcohol/week: 0.0 standard drinks of alcohol   Drug use: No   Sexual activity: Yes    Birth control/protection: Coitus interruptus  Other Topics Concern   Not on file  Social History Narrative   Married for 15 years.Works at [employer] shift.   Social Determinants of Health   Financial Resource Strain: Medium Risk (12/01/2020)   Overall Financial Resource Strain (CARDIA)    Difficulty of Paying Living Expenses: Somewhat hard  Food Insecurity: No Food Insecurity (10/21/2023)   Hunger Vital Sign    Worried About Running Out of Food in the Last Year: Never true    Ran Out of Food in the Last Year: Never true  Transportation Needs:  Patient Declined (10/01/2023)   PRAPARE - Transportation    Lack of Transportation (Medical): Patient declined    Lack of Transportation (Non-Medical): Patient declined  Physical Activity: Inactive (12/01/2020)   Exercise Vital Sign    Days of Exercise per Week: 0 days    Minutes of Exercise per Session: 0 min  Stress: Stress Concern Present (12/01/2020)   [employer] of Occupational Health - Occupational Stress Questionnaire    Feeling of Stress : To some extent  Social Connections: Moderately Integrated (12/01/2020)   Social Connection and Isolation Panel [NHANES]    Frequency of Communication with Friends and Family: More than three times a week    Frequency of Social Gatherings with Friends and Family: Once a week    Attends Religious Services: 1 to 4 times per year    Active Member of [employer] or Organizations: No    Attends Banker Meetings: Never    Marital Status: Married    Family History  Problem Relation Age of Onset   Cancer Mother        lung   Diabetes Mother    Hypertension Mother    Cancer Father        throat, tongue   Diabetes Maternal Grandmother    Hypertension Maternal Grandmother    Cancer Paternal Grandfather

## 2023-10-23 NOTE — Telephone Encounter (Signed)
We can complete these forms. We will see her sooner if her symptoms gets worse.

## 2023-10-23 NOTE — Telephone Encounter (Signed)
Pt stopped in today to have forms filled out and asked why she is scheduled to come back in 1 year (was here yesterday) She is thinking she will need to be seen earlier since she is still in PT and will need to be released to go back to work before 1 year. Please call and advise. Can leave a message if not home

## 2023-10-23 NOTE — Telephone Encounter (Signed)
Will forward to Jane 

## 2023-10-23 NOTE — Telephone Encounter (Signed)
Pt is calling to speak to Erskine Squibb regarding community resources. Please advise CB- [phone]

## 2023-10-23 NOTE — Telephone Encounter (Signed)
I returned the call to the patient and she said it was her father's girlfriend that called this office. The patient didn't call. She went on to explain that the girlfriend is just worried about her and wants to make sure she gets paperwork done.  The patient said that she turned the [facility] paperwork into the neurologist yesterday and at this time nothing else is needed, she just needs to wait for an answer about the [facility] request.   She did not have any other questions at this time

## 2023-10-28 ENCOUNTER — Ambulatory Visit (HOSPITAL_COMMUNITY): Payer: BC Managed Care – PPO

## 2023-10-28 ENCOUNTER — Encounter (HOSPITAL_COMMUNITY): Payer: BC Managed Care – PPO | Admitting: Occupational Therapy

## 2023-10-28 ENCOUNTER — Encounter (HOSPITAL_COMMUNITY): Payer: Self-pay

## 2023-10-28 DIAGNOSIS — R29818 Other symptoms and signs involving the nervous system: Secondary | ICD-10-CM | POA: Diagnosis not present

## 2023-10-28 DIAGNOSIS — Z0289 Encounter for other administrative examinations: Secondary | ICD-10-CM

## 2023-10-28 DIAGNOSIS — Z7409 Other reduced mobility: Secondary | ICD-10-CM | POA: Diagnosis not present

## 2023-10-28 DIAGNOSIS — R278 Other lack of coordination: Secondary | ICD-10-CM | POA: Diagnosis not present

## 2023-10-28 DIAGNOSIS — I639 Cerebral infarction, unspecified: Secondary | ICD-10-CM

## 2023-10-28 DIAGNOSIS — R262 Difficulty in walking, not elsewhere classified: Secondary | ICD-10-CM

## 2023-10-28 NOTE — Therapy (Signed)
OUTPATIENT PHYSICAL THERAPY NEURO TREATMENT   Patient Name: Melissa Wilkerson MRN: [id number] DOB:Apr 28, 1976, 47 y.o., female Today's Date: 10/28/2023   PCP: Grayce Sessions, NP REFERRING PROVIDER: Charlton Amor, PA-C  END OF SESSION:  PT End of Session - 10/28/23 1302     Visit Number 2    Number of Visits 12    Date for PT Re-Evaluation 12/02/23    Authorization Type Blue Cross Blue [employer]    PT Start Time 1302    PT Stop Time 1340    PT Time Calculation (min) 38 min    Activity Tolerance Patient tolerated treatment well    Behavior During Therapy [facility] for tasks assessed/performed             Past Medical History:  Diagnosis Date   Abdominal bloating 05/26/2015   Breast nodule 03/31/2015   Dyspareunia 05/26/2015   Essential hypertension, benign 09/28/2019   HLD (hyperlipidemia) 09/28/2019   Hypothyroidism, adult 09/28/2019   Vitamin D deficiency disease 09/28/2019   History reviewed. No pertinent surgical history. Patient Active Problem List   Diagnosis Date Noted   CVA (cerebral vascular accident) (HCC) 10/06/2023   Controlled type 2 diabetes mellitus with hyperglycemia, without long-term current use of insulin (HCC) 10/04/2023   Acute ischemic stroke (HCC) 10/02/2023   Acute CVA (cerebrovascular accident) (HCC) 10/01/2023   Pre-diabetes 10/01/2023   Encounter for screening fecal occult blood testing 12/01/2020   Encounter for gynecological examination with Papanicolaou smear of cervix 12/01/2020   Tobacco abuse 12/01/2020   Cigarette nicotine dependence without complication 09/20/2020   Essential hypertension, benign 09/28/2019   Hypothyroidism, adult 09/28/2019   Vitamin D deficiency disease 09/28/2019   HLD (hyperlipidemia) 09/28/2019   Abdominal bloating 05/26/2015   Dyspareunia 05/26/2015   Breast nodule 03/31/2015    ONSET DATE: 10/01/2023  REFERRING DIAG: I63.9 (ICD-10-CM) - Cerebral infarction, unspecified  THERAPY DIAG:  Difficulty in  walking, not elsewhere classified  Cerebrovascular accident (CVA), unspecified mechanism (HCC)  Other symptoms and signs involving the nervous system  Rationale for Evaluation and Treatment: Rehabilitation  SUBJECTIVE:                                                                                                                                                                                             SUBJECTIVE STATEMENT:  10/28/23:  Pt arrived ambulating with rollator, only uses when out and about.  Does not ambulate with any AD at home.  No reports of pain or recent falls.    Eval:Patient presents to the clinic post CVA on 10/01/23. She states standing up from her recliner and fell right  and caught herself on the coffee table. Noticed right side weakness in UE, face and could tell something was off. Went to ED; discharged from CIR on 10/09/23. To date, patient has noticed balance problems when walking and turning. Not so much when standing. Prior to CVA she was completely independent will all ADLs and worked as a Brewing technologist; currently trying to get on disability. Notices leaning to the right and listing that way when walking; has left sided numbness and doesn't feel pain on that side in UE or LE. Can feel touch but not the pain from pinch. Sometimes her left arm is goes cold. Patient is doing well with most ADLs and has her husband and son help when needed; currently not driving. Patient is seeing family med NP this afternoon for the first session.  Pt accompanied by: family member; son in lobby; didn't come back for evaluation.   PERTINENT HISTORY: Smoker, HTN  PAIN:  Are you having pain? No; has some LBP and neck pain but doesn't attribute that to the CVA.  PRECAUTIONS: None  RED FLAGS: None   WEIGHT BEARING RESTRICTIONS: No  FALLS: Has patient fallen in last 6 months? No  LIVING ENVIRONMENT: Lives with: lives with their family, lives with their spouse, and lives with their  son Lives in: House/apartment Stairs: Yes: Internal: 1 steps; none and External: 1 steps; none Has following equipment at home: Walker - 4 wheeled  PLOF: Independent  PATIENT GOALS: Get back to work. Cleaning around the house.   OBJECTIVE:  Note: Objective measures were completed at Evaluation unless otherwise noted.  DIAGNOSTIC FINDINGS:  10/01/23 MRI FINDINGS: Brain: Acute infarct in the right midbrain. Slight edema without mass effect. Age advanced T2/FLAIR hyperintensities in the white matter. No acute hemorrhage, mass lesion, midline shift or hydrocephalus. Vascular: Major arterial flow voids are maintained skull base. Skull and upper cervical spine: Normal marrow signal. Sinuses/Orbits: Negative.   COGNITION: Overall cognitive status: Within functional limits for tasks assessed   SENSATION: WFL  COORDINATION: Appears WFL for UE and LE   MUSCLE TONE: LLE: Within functional limits   POSTURE: rounded shoulders and forward head    UE and LE AROM grossly WNL throughout UE MMTs grossly WNL throughout  LOWER EXTREMITY ROM:     Active  Right Eval Left Eval  Hip flexion    Hip extension    Hip abduction    Hip adduction    Hip internal rotation    Hip external rotation    Knee flexion    Knee extension    Ankle dorsiflexion    Ankle plantarflexion    Ankle inversion    Ankle eversion     (Blank rows = not tested)  LOWER EXTREMITY MMT:    MMT Right Eval Left Eval  Hip flexion 4+ 4+  Hip extension    Hip abduction    Hip adduction    Hip internal rotation    Hip external rotation    Knee flexion (sitting) 4 4  Knee extension 5 5  Ankle dorsiflexion 4 5  Ankle plantarflexion    Ankle inversion    Ankle eversion    (Blank rows = not tested)  BED MOBILITY:  Patient reports no issues with bed mobility  TRANSFERS: Assistive device utilized: None  Sit to stand: Complete Independence Stand to sit: Complete Independence Chair to chair:  Not tested;  no issue reported Floor:  Not tested; no issue reported  RAMP:  Level of Assistance: Modified independence  Assistive device utilized: Environmental consultant - 4 wheeled and None Ramp Comments:   CURB:  Level of Assistance: SBA Assistive device utilized: None Curb Comments: Patient mentions having to go slow and be intentional   GAIT: Gait pattern: WFL, step through pattern, and decreased stride length Distance walked: 500 feet in clinic Assistive device utilized: Walker - 4 wheeled Level of assistance: Modified independence Comments:   FUNCTIONAL TESTS:  5 times sit to stand: 12 second 2 minute walk test: 349 feet Single leg balance: Right: 4 sec; Left: 6 sec  PATIENT SURVEYS:  FOTO 59  TODAY'S TREATMENT:                                                                                                                              DATE:  12/10:  Reviewed goals Educated importance of HEP complinace for maximal benefits 10 STS no HHA (WB L>Rt, cueing to equalize) 10x STS with Rt foot behind Standing: Tandem stance Tandem stance with 6in step 2x 30" Tandem stance with 6in step and UE flexion with 3# bar Alternating marching 10x 5" no HHA Toe tapping 6in 10x Toe tapping 6in opposite UE/LE SLS Rt 16", Lt 16" Abduction 10x 3" holds iwht RTB around thigh Sidestep RTB around thigh 5RT inside //bars no HHA Gait training with cueing to improve arm swing Vector stance with 1 HHA 3x 5" BLE  10/21/2023: Initial Evaluation and initiated HEP  PATIENT EDUCATION: Education details: Patient educated on exam findings, POC, scope of PT, HEP, and PT prognosis. Person educated: Patient Education method: Explanation, Demonstration, and Handouts Education comprehension: verbalized understanding, returned demonstration, verbal cues required, and tactile cues required   HOME EXERCISE PROGRAM: Access Code: [email or web address]  URL: [email or web address]  Date: 10/21/2023  Prepared by: AP  Rehab  Exercises  Sit to Stand  - 2 x daily - 7 x weekly - 1 sets - 10 reps  Standing Tandem Balance with Counter Support  - 2 x daily - 7 x weekly - 1 sets - 3 reps - 30 sec hold  Standing March with Counter Support  - 2 x daily - 7 x weekly - 2 sets - 10 reps    10/28/23: - Side Stepping with Resistance at Thighs and Counter Support  - 2 x daily - 7 x weekly - 3 sets - 10 reps - Single Leg Stance  - 2 x daily - 7 x weekly - 1 sets - 5 reps - 5" hold  GOALS: Goals reviewed with patient? No  SHORT TERM GOALS: Target date: 11/11/2023  Patient will be independent with managing initial HEP to improve self management and self care.  Baseline: Goal status: INITIAL  2.  Patient will be able confident in standing and walking for >20 minutes without AD in order to tolerate grocery shopping. Baseline:  Goal status: INITIAL   LONG TERM GOALS: Target date: 12/02/2023  Patient will improve 2 MWT to 400 feet w/o AD in order  to demonstrate improved functional ambulatory capacity in community setting and promote return to work. Baseline:  Goal status: INITIAL  2.  By discharge, patient will increase FOTO score by >10 points in order to improve functional capacity and tolerance to daily activity.  Baseline: 59 Goal status: INITIAL  3.  Patient will be able confident in standing and walking for >45 minutes without AD in order to tolerate demands of returning to work. Baseline:  Goal status: INITIAL  4.  Patient will demonstrate SLS > 15 sec bilaterally to improve balance and decrease risk of falls. Baseline: Right: 4 sec; Left: 6 sec Goal status: INITIAL  5.  Patient will improve R LE knee flexion and ankle dorsiflexion to 4+/5 to decrease risk of falls and improve functional strength.  Baseline:  Goal status: INITIAL  ASSESSMENT:  CLINICAL IMPRESSION: 10/28/23:  Reviewed goals and educated importance of HEP compliance.  Pt able to recall and demonstrate with minor cueing for mechanics.  Pt  with tendency to present with decreased Rt LE weight bearing during sit to stand.  Added STS with split stance and balance activities with increased Rt LE weight bearing for strengthening.  Min cueing to improve arm swing with gait.  Added sidestep and SLS to HEP with printout given with verbalized understanding.    Eval:  Patient is a 47 y.o. female who was seen today for physical therapy evaluation and treatment for a right midbrain infarction. 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 She's been able to navigate her home without the use of her RW but takes it any time she's out in the community. Functional tests (5x STS and ) reveal good physical ability for an acute stroke. Patient has little difficulty with any transfers. She still notices a right pulsion which is slightly observed during and while seated during the evaluation. Single leg stance is more difficult on the right leg. SLS on both sides resulted in a LOB to the right. LE MMTs also revealed slight right-sided weakness compared to the left. Throughout session, patient's speech was slow but fluent, not slurred. No difficulty with cognition, processing questions and commands.   OBJECTIVE IMPAIRMENTS: decreased balance, dizziness, and obesity.   ACTIVITY LIMITATIONS: caring for others  PARTICIPATION LIMITATIONS: cleaning, laundry, driving, shopping, and occupation  PERSONAL FACTORS: Transportation and 1-2 comorbidities: HTN, smoker  are also affecting patient's functional outcome.   REHAB POTENTIAL: Good  CLINICAL DECISION MAKING: Stable/uncomplicated  EVALUATION COMPLEXITY: Moderate  PLAN:  PT FREQUENCY: 1-2x/week  PT DURATION: 6 weeks  PLANNED INTERVENTIONS: 97164- PT Re-evaluation, 97110-Therapeutic exercises, 97530- Therapeutic activity, 97112- Neuromuscular re-education, 97535- Self Care, 04540- Manual therapy, [phone]- Gait training, Balance training, and Stair  training  PLAN FOR NEXT SESSION: Functional strength and balance activities.   Becky Sax, LPTA/CLT; CBIS [phone]  Juel Burrow, PTA 10/28/2023, 1:48 PM   1:48 PM, 10/28/23

## 2023-10-30 ENCOUNTER — Encounter (HOSPITAL_COMMUNITY): Payer: BC Managed Care – PPO | Admitting: Occupational Therapy

## 2023-10-30 ENCOUNTER — Encounter (HOSPITAL_COMMUNITY): Payer: Self-pay

## 2023-10-30 ENCOUNTER — Ambulatory Visit (HOSPITAL_COMMUNITY)
Admission: RE | Admit: 2023-10-30 | Discharge: 2023-10-30 | Disposition: A | Discharge status: Home / Self Care | Payer: BC Managed Care – PPO | Source: Ambulatory Visit | Attending: Primary Care | Admitting: Primary Care

## 2023-10-30 ENCOUNTER — Ambulatory Visit (HOSPITAL_COMMUNITY): Payer: BC Managed Care – PPO

## 2023-10-30 ENCOUNTER — Telehealth: Payer: Self-pay | Admitting: *Deleted

## 2023-10-30 DIAGNOSIS — Z7409 Other reduced mobility: Secondary | ICD-10-CM | POA: Diagnosis not present

## 2023-10-30 DIAGNOSIS — Z1231 Encounter for screening mammogram for malignant neoplasm of breast: Secondary | ICD-10-CM | POA: Insufficient documentation

## 2023-10-30 DIAGNOSIS — R278 Other lack of coordination: Secondary | ICD-10-CM | POA: Diagnosis not present

## 2023-10-30 DIAGNOSIS — I639 Cerebral infarction, unspecified: Secondary | ICD-10-CM | POA: Diagnosis not present

## 2023-10-30 DIAGNOSIS — R262 Difficulty in walking, not elsewhere classified: Secondary | ICD-10-CM

## 2023-10-30 DIAGNOSIS — R29818 Other symptoms and signs involving the nervous system: Secondary | ICD-10-CM

## 2023-10-30 NOTE — Telephone Encounter (Signed)
Pt [place] form @ front desk for p/u

## 2023-10-30 NOTE — Therapy (Signed)
OUTPATIENT PHYSICAL THERAPY NEURO TREATMENT   Patient Name: Melissa Wilkerson MRN: [id number] DOB:1976/02/12, 47 y.o., female Today's Date: 10/30/2023   PCP: Grayce Sessions, NP REFERRING PROVIDER: Charlton Amor, PA-C  END OF SESSION:  PT End of Session - 10/30/23 1256     Visit Number 3    Number of Visits 12    Date for PT Re-Evaluation 12/02/23    Authorization Type Blue Cross Blue Shield    PT Start Time [phone]    PT Stop Time 1340    PT Time Calculation (min) 39 min    Activity Tolerance Patient tolerated treatment well    Behavior During Therapy [facility] for tasks assessed/performed             Past Medical History:  Diagnosis Date   Abdominal bloating 05/26/2015   Breast nodule 03/31/2015   Dyspareunia 05/26/2015   Essential hypertension, benign 09/28/2019   HLD (hyperlipidemia) 09/28/2019   Hypothyroidism, adult 09/28/2019   Vitamin D deficiency disease 09/28/2019   History reviewed. No pertinent surgical history. Patient Active Problem List   Diagnosis Date Noted   CVA (cerebral vascular accident) (HCC) 10/06/2023   Controlled type 2 diabetes mellitus with hyperglycemia, without long-term current use of insulin (HCC) 10/04/2023   Acute ischemic stroke (HCC) 10/02/2023   Acute CVA (cerebrovascular accident) (HCC) 10/01/2023   Pre-diabetes 10/01/2023   Encounter for screening fecal occult blood testing 12/01/2020   Encounter for gynecological examination with Papanicolaou smear of cervix 12/01/2020   Tobacco abuse 12/01/2020   Cigarette nicotine dependence without complication 09/20/2020   Essential hypertension, benign 09/28/2019   Hypothyroidism, adult 09/28/2019   Vitamin D deficiency disease 09/28/2019   HLD (hyperlipidemia) 09/28/2019   Abdominal bloating 05/26/2015   Dyspareunia 05/26/2015   Breast nodule 03/31/2015    ONSET DATE: 10/01/2023  REFERRING DIAG: I63.9 (ICD-10-CM) - Cerebral infarction, unspecified  THERAPY DIAG:  Difficulty in  walking, not elsewhere classified  Cerebrovascular accident (CVA), unspecified mechanism (HCC)  Other symptoms and signs involving the nervous system  Rationale for Evaluation and Treatment: Rehabilitation  SUBJECTIVE:                                                                                                                                                                                             SUBJECTIVE STATEMENT: 10/30/23:  Feeling good today, does have some soreness Rt hip today.  Has began additional HEP without questions.  Eval:Patient presents to the clinic post CVA on 10/01/23. She states standing up from her recliner and fell right and caught herself on the coffee table. Noticed right side weakness in UE, face  and could tell something was off. Went to ED; discharged from CIR on 10/09/23. To date, patient has noticed balance problems when walking and turning. Not so much when standing. Prior to CVA she was completely independent will all ADLs and worked as a Brewing technologist; currently trying to get on disability. Notices leaning to the right and listing that way when walking; has left sided numbness and doesn't feel pain on that side in UE or LE. Can feel touch but not the pain from pinch. Sometimes her left arm is goes cold. Patient is doing well with most ADLs and has her husband and son help when needed; currently not driving. Patient is seeing family med NP this afternoon for the first session.  Pt accompanied by: family member; son in lobby; didn't come back for evaluation.   PERTINENT HISTORY: Smoker, HTN  PAIN:  Are you having pain? No; has some LBP and neck pain but doesn't attribute that to the CVA.  PRECAUTIONS: None  RED FLAGS: None   WEIGHT BEARING RESTRICTIONS: No  FALLS: Has patient fallen in last 6 months? No  LIVING ENVIRONMENT: Lives with: lives with their family, lives with their spouse, and lives with their son Lives in: House/apartment Stairs: Yes:  Internal: 1 steps; none and External: 1 steps; none Has following equipment at home: Walker - 4 wheeled  PLOF: Independent  PATIENT GOALS: Get back to work. Cleaning around the house.   OBJECTIVE:  Note: Objective measures were completed at Evaluation unless otherwise noted.  DIAGNOSTIC FINDINGS:  10/01/23 MRI FINDINGS: Brain: Acute infarct in the right midbrain. Slight edema without mass effect. Age advanced T2/FLAIR hyperintensities in the white matter. No acute hemorrhage, mass lesion, midline shift or hydrocephalus. Vascular: Major arterial flow voids are maintained skull base. Skull and upper cervical spine: Normal marrow signal. Sinuses/Orbits: Negative.   COGNITION: Overall cognitive status: Within functional limits for tasks assessed   SENSATION: WFL  COORDINATION: Appears WFL for UE and LE   MUSCLE TONE: LLE: Within functional limits   POSTURE: rounded shoulders and forward head    UE and LE AROM grossly WNL throughout UE MMTs grossly WNL throughout  LOWER EXTREMITY ROM:     Active  Right Eval Left Eval  Hip flexion    Hip extension    Hip abduction    Hip adduction    Hip internal rotation    Hip external rotation    Knee flexion    Knee extension    Ankle dorsiflexion    Ankle plantarflexion    Ankle inversion    Ankle eversion     (Blank rows = not tested)  LOWER EXTREMITY MMT:    MMT Right Eval Left Eval  Hip flexion 4+ 4+  Hip extension    Hip abduction    Hip adduction    Hip internal rotation    Hip external rotation    Knee flexion (sitting) 4 4  Knee extension 5 5  Ankle dorsiflexion 4 5  Ankle plantarflexion    Ankle inversion    Ankle eversion    (Blank rows = not tested)  BED MOBILITY:  Patient reports no issues with bed mobility  TRANSFERS: Assistive device utilized: None  Sit to stand: Complete Independence Stand to sit: Complete Independence Chair to chair:  Not tested; no issue reported Floor:  Not tested; no  issue reported  RAMP:  Level of Assistance: Modified independence Assistive device utilized: Walker - 4 wheeled and None Ramp Comments:   CURB:  Level of Assistance: SBA Assistive device utilized: None Curb Comments: Patient mentions having to go slow and be intentional   GAIT: Gait pattern: WFL, step through pattern, and decreased stride length Distance walked: 500 feet in clinic Assistive device utilized: Walker - 4 wheeled Level of assistance: Modified independence Comments:   FUNCTIONAL TESTS:  5 times sit to stand: 12 second 2 minute walk test: 349 feet Single leg balance: Right: 4 sec; Left: 6 sec  PATIENT SURVEYS:  FOTO 59  TODAY'S TREATMENT:                                                                                                                              DATE:  10/30/23: Sit to stand to heel raise 10x Marching UE with opposite LE 10 Tandem stance with one foot on 6in 3x 30"  Forward step up 6 in 10-15 reps (pt nor therapist counted) Step down 6in 10-15x STS Rt foot behind SLS Lt 31", Rt 24" Vector stance 3x 5" Tandem stance 3x 30" on foam Gait no AD 261ft, increased cadence and good heel to toe mechanics- cueing for arm swing Leg press 2x 10 4Pl  12/10:  Reviewed goals Educated importance of HEP complinace for maximal benefits 10 STS no HHA (WB L>Rt, cueing to equalize) 10x STS with Rt foot behind Standing: Tandem stance Tandem stance with 6in step 2x 30" Tandem stance with 6in step and UE flexion with 3# bar Alternating marching 10x 5" no HHA Toe tapping 6in 10x Toe tapping 6in opposite UE/LE SLS Rt 16", Lt 16" Abduction 10x 3" holds iwht RTB around thigh Sidestep RTB around thigh 5RT inside //bars no HHA Gait training with cueing to improve arm swing Vector stance with 1 HHA 3x 5" BLE  10/21/2023: Initial Evaluation and initiated HEP  PATIENT EDUCATION: Education details: Patient educated on exam findings, POC, scope of PT, HEP, and  PT prognosis. Person educated: Patient Education method: Explanation, Demonstration, and Handouts Education comprehension: verbalized understanding, returned demonstration, verbal cues required, and tactile cues required   HOME EXERCISE PROGRAM: Access Code: [email or web address]  URL: [email or web address]  Date: 10/21/2023  Prepared by: AP  Rehab Exercises  Sit to Stand  - 2 x daily - 7 x weekly - 1 sets - 10 reps  Standing Tandem Balance with Counter Support  - 2 x daily - 7 x weekly - 1 sets - 3 reps - 30 sec hold  Standing March with Counter Support  - 2 x daily - 7 x weekly - 2 sets - 10 reps    10/28/23: - Side Stepping with Resistance at Thighs and Counter Support  - 2 x daily - 7 x weekly - 3 sets - 10 reps - Single Leg Stance  - 2 x daily - 7 x weekly - 1 sets - 5 reps - 5" hold  GOALS: Goals reviewed with patient? No  SHORT TERM GOALS: Target date: 11/11/2023  Patient will be independent  with managing initial HEP to improve self management and self care.  Baseline: Goal status: INITIAL  2.  Patient will be able confident in standing and walking for >20 minutes without AD in order to tolerate grocery shopping. Baseline:  Goal status: INITIAL   LONG TERM GOALS: Target date: 12/02/2023  Patient will improve 2 MWT to 400 feet w/o AD in order to demonstrate improved functional ambulatory capacity in community setting and promote return to work. Baseline:  Goal status: INITIAL  2.  By discharge, patient will increase FOTO score by >10 points in order to improve functional capacity and tolerance to daily activity.  Baseline: 59 Goal status: INITIAL  3.  Patient will be able confident in standing and walking for >45 minutes without AD in order to tolerate demands of returning to work. Baseline:  Goal status: INITIAL  4.  Patient will demonstrate SLS > 15 sec bilaterally to improve balance and decrease risk of falls. Baseline: Right: 4 sec; Left: 6 sec Goal status:  INITIAL  5.  Patient will improve R LE knee flexion and ankle dorsiflexion to 4+/5 to decrease risk of falls and improve functional strength.  Baseline:  Goal status: INITIAL  ASSESSMENT:  CLINICAL IMPRESSION: 10/30/23:  Session focus with strength and balance training.  Pt presents with improvements with increased gait cadence and good heel to toe mechanics, cueing to improve Rt arm swing.  Continues to demonstrate Rt side weakness though making good progress noted with improved SLS BLE.  Additional exercises complete for functional strengthening with step up training and leg press as well as increased difficulty with head turns and dynamic surface with balance activities.  Eval:  Patient is a 47 y.o. female who was seen today for physical therapy evaluation and treatment for a right midbrain infarction. 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 She's been able to navigate her home without the use of her RW but takes it any time she's out in the community. Functional tests (5x STS and ) reveal good physical ability for an acute stroke. Patient has little difficulty with any transfers. She still notices a right pulsion which is slightly observed during and while seated during the evaluation. Single leg stance is more difficult on the right leg. SLS on both sides resulted in a LOB to the right. LE MMTs also revealed slight right-sided weakness compared to the left. Throughout session, patient's speech was slow but fluent, not slurred. No difficulty with cognition, processing questions and commands.   OBJECTIVE IMPAIRMENTS: decreased balance, dizziness, and obesity.   ACTIVITY LIMITATIONS: caring for others  PARTICIPATION LIMITATIONS: cleaning, laundry, driving, shopping, and occupation  PERSONAL FACTORS: Transportation and 1-2 comorbidities: HTN, smoker  are also affecting patient's functional outcome.   REHAB POTENTIAL:  Good  CLINICAL DECISION MAKING: Stable/uncomplicated  EVALUATION COMPLEXITY: Moderate  PLAN:  PT FREQUENCY: 1-2x/week  PT DURATION: 6 weeks  PLANNED INTERVENTIONS: 97164- PT Re-evaluation, 97110-Therapeutic exercises, 97530- Therapeutic activity, 97112- Neuromuscular re-education, 97535- Self Care, 01601- Manual therapy, [phone]- Gait training, Balance training, and Stair training  PLAN FOR NEXT SESSION: Functional strength and balance activities.  Squats, lunges, warrior poses, stair training (then carrying stuff up/down stairs) etc...  Becky Sax, LPTA/CLT; CBIS [phone]  Juel Burrow, PTA 10/30/2023, 1:53 PM   1:53 PM, 10/30/23

## 2023-11-04 ENCOUNTER — Ambulatory Visit (HOSPITAL_COMMUNITY): Payer: BC Managed Care – PPO

## 2023-11-04 ENCOUNTER — Encounter (HOSPITAL_COMMUNITY): Payer: BC Managed Care – PPO | Admitting: Occupational Therapy

## 2023-11-04 DIAGNOSIS — I639 Cerebral infarction, unspecified: Secondary | ICD-10-CM | POA: Diagnosis not present

## 2023-11-04 DIAGNOSIS — R29818 Other symptoms and signs involving the nervous system: Secondary | ICD-10-CM

## 2023-11-04 DIAGNOSIS — R278 Other lack of coordination: Secondary | ICD-10-CM

## 2023-11-04 DIAGNOSIS — Z7409 Other reduced mobility: Secondary | ICD-10-CM | POA: Diagnosis not present

## 2023-11-04 DIAGNOSIS — R262 Difficulty in walking, not elsewhere classified: Secondary | ICD-10-CM | POA: Diagnosis not present

## 2023-11-04 NOTE — Therapy (Signed)
OUTPATIENT PHYSICAL THERAPY NEURO TREATMENT   Patient Name: Melissa Wilkerson MRN: [id number] DOB:[birth year], 47 y.o., female Today's Date: 11/04/2023   PCP: Grayce Sessions, NP REFERRING PROVIDER: Charlton Amor, PA-C  END OF SESSION:  PT End of Session - 11/04/23 1304     Visit Number 4    Number of Visits 12    Date for PT Re-Evaluation 12/02/23    Authorization Type [employer]    PT Start Time 1304    PT Stop Time 1344    PT Time Calculation (min) 40 min    Activity Tolerance Patient tolerated treatment well    Behavior During Therapy [facility] for tasks assessed/performed             Past Medical History:  Diagnosis Date   Abdominal bloating 05/26/2015   Breast nodule 03/31/2015   Dyspareunia 05/26/2015   Essential hypertension, benign 09/28/2019   HLD (hyperlipidemia) 09/28/2019   Hypothyroidism, adult 09/28/2019   Vitamin D deficiency disease 09/28/2019   No past surgical history on file. Patient Active Problem List   Diagnosis Date Noted   CVA (cerebral vascular accident) (HCC) 10/06/2023   Controlled type 2 diabetes mellitus with hyperglycemia, without long-term current use of insulin (HCC) 10/04/2023   Acute ischemic stroke (HCC) 10/02/2023   Acute CVA (cerebrovascular accident) (HCC) 10/01/2023   Pre-diabetes 10/01/2023   Encounter for screening fecal occult blood testing 12/01/2020   Encounter for gynecological examination with Papanicolaou smear of cervix 12/01/2020   Tobacco abuse 12/01/2020   Cigarette nicotine dependence without complication 09/20/2020   Essential hypertension, benign 09/28/2019   Hypothyroidism, adult 09/28/2019   Vitamin D deficiency disease 09/28/2019   HLD (hyperlipidemia) 09/28/2019   Abdominal bloating 05/26/2015   Dyspareunia 05/26/2015   Breast nodule 03/31/2015    ONSET DATE: 10/01/2023  REFERRING DIAG: I63.9 (ICD-10-CM) - Cerebral infarction, unspecified  THERAPY DIAG:  Difficulty in walking, not  elsewhere classified  Cerebrovascular accident (CVA), unspecified mechanism (HCC)  Other symptoms and signs involving the nervous system  Other lack of coordination  Impaired functional mobility, balance, gait, and endurance  Rationale for Evaluation and Treatment: Rehabilitation  SUBJECTIVE:                                                                                                                                                                                             SUBJECTIVE STATEMENT: 11/04/23: when she gets tired she feels a little tightness on right side and her low back; able to furniture walk in the house.    Eval:Patient presents to the clinic post CVA on 10/01/23. She states standing up  from her recliner and fell right and caught herself on the coffee table. Noticed right side weakness in UE, face and could tell something was off. Went to ED; discharged from CIR on 10/09/23. To date, patient has noticed balance problems when walking and turning. Not so much when standing. Prior to CVA she was completely independent will all ADLs and worked as a Brewing technologist; currently trying to get on disability. Notices leaning to the right and listing that way when walking; has left sided numbness and doesn't feel pain on that side in UE or LE. Can feel touch but not the pain from pinch. Sometimes her left arm is goes cold. Patient is doing well with most ADLs and has her husband and son help when needed; currently not driving. Patient is seeing family med NP this afternoon for the first session.  Pt accompanied by: family member; son in lobby; didn't come back for evaluation.   PERTINENT HISTORY: Smoker, HTN  PAIN:  Are you having pain? No; has some LBP and neck pain but doesn't attribute that to the CVA.  PRECAUTIONS: None  RED FLAGS: None   WEIGHT BEARING RESTRICTIONS: No  FALLS: Has patient fallen in last 6 months? No  LIVING ENVIRONMENT: Lives with: lives with their  family, lives with their spouse, and lives with their son Lives in: House/apartment Stairs: Yes: Internal: 1 steps; none and External: 1 steps; none Has following equipment at home: Walker - 4 wheeled  PLOF: Independent  PATIENT GOALS: Get back to work. Cleaning around the house.   OBJECTIVE:  Note: Objective measures were completed at Evaluation unless otherwise noted.  DIAGNOSTIC FINDINGS:  10/01/23 MRI FINDINGS: Brain: Acute infarct in the right midbrain. Slight edema without mass effect. Age advanced T2/FLAIR hyperintensities in the white matter. No acute hemorrhage, mass lesion, midline shift or hydrocephalus. Vascular: Major arterial flow voids are maintained skull base. Skull and upper cervical spine: Normal marrow signal. Sinuses/Orbits: Negative.   COGNITION: Overall cognitive status: Within functional limits for tasks assessed   SENSATION: WFL  COORDINATION: Appears WFL for UE and LE   MUSCLE TONE: LLE: Within functional limits   POSTURE: rounded shoulders and forward head    UE and LE AROM grossly WNL throughout UE MMTs grossly WNL throughout  LOWER EXTREMITY ROM:     Active  Right Eval Left Eval  Hip flexion    Hip extension    Hip abduction    Hip adduction    Hip internal rotation    Hip external rotation    Knee flexion    Knee extension    Ankle dorsiflexion    Ankle plantarflexion    Ankle inversion    Ankle eversion     (Blank rows = not tested)  LOWER EXTREMITY MMT:    MMT Right Eval Left Eval  Hip flexion 4+ 4+  Hip extension    Hip abduction    Hip adduction    Hip internal rotation    Hip external rotation    Knee flexion (sitting) 4 4  Knee extension 5 5  Ankle dorsiflexion 4 5  Ankle plantarflexion    Ankle inversion    Ankle eversion    (Blank rows = not tested)  BED MOBILITY:  Patient reports no issues with bed mobility  TRANSFERS: Assistive device utilized: None  Sit to stand: Complete Independence Stand to sit:  Complete Independence Chair to chair:  Not tested; no issue reported Floor:  Not tested; no issue reported  RAMP:  Level of Assistance: Modified independence Assistive device utilized: Environmental consultant - 4 wheeled and None Ramp Comments:   CURB:  Level of Assistance: SBA Assistive device utilized: None Curb Comments: Patient mentions having to go slow and be intentional   GAIT: Gait pattern: WFL, step through pattern, and decreased stride length Distance walked: 500 feet in clinic Assistive device utilized: Walker - 4 wheeled Level of assistance: Modified independence Comments:   FUNCTIONAL TESTS:  5 times sit to stand: 12 second 2 minute walk test: 349 feet Single leg balance: Right: 4 sec; Left: 6 sec  PATIENT SURVEYS:  FOTO 59  TODAY'S TREATMENT:                                                                                                                              DATE:  11/04/23 Nustep seat 5 x 5' dynamic warm up Gait training with SPC x 1 lap Gait training without AD x 1 lap Discussed and issued walking program Heel toe raises x 20 SLS x 30" each (intermittent UE assist) Tandem stance x 30" each no UE assist Sit to stand 2 x 10 no UE assist from low mat table 7" step navigation with 1 hand held assist/railing up and down x 3 Toe tap cones 2 sets of x 3 each no UE assist Step over cones x 3 each    10/30/23: Sit to stand to heel raise 10x Marching UE with opposite LE 10 Tandem stance with one foot on 6in 3x 30"  Forward step up 6 in 10-15 reps (pt nor therapist counted) Step down 6in 10-15x STS Rt foot behind SLS Lt 31", Rt 24" Vector stance 3x 5" Tandem stance 3x 30" on foam Gait no AD 219ft, increased cadence and good heel to toe mechanics- cueing for arm swing Leg press 2x 10 4Pl  12/10:  Reviewed goals Educated importance of HEP complinace for maximal benefits 10 STS no HHA (WB L>Rt, cueing to equalize) 10x STS with Rt foot behind Standing: Tandem  stance Tandem stance with 6in step 2x 30" Tandem stance with 6in step and UE flexion with 3# bar Alternating marching 10x 5" no HHA Toe tapping 6in 10x Toe tapping 6in opposite UE/LE SLS Rt 16", Lt 16" Abduction 10x 3" holds iwht RTB around thigh Sidestep RTB around thigh 5RT inside //bars no HHA Gait training with cueing to improve arm swing Vector stance with 1 HHA 3x 5" BLE  10/21/2023: Initial Evaluation and initiated HEP  PATIENT EDUCATION: Education details: Patient educated on exam findings, POC, scope of PT, HEP, and PT prognosis. Person educated: Patient Education method: Explanation, Demonstration, and Handouts Education comprehension: verbalized understanding, returned demonstration, verbal cues required, and tactile cues required   HOME EXERCISE PROGRAM: Access Code: [email or web address]  URL: [email or web address]  Date: 10/21/2023  Prepared by: AP  Rehab Exercises  Sit to Stand  - 2 x daily - 7 x weekly - 1 sets - 10 reps  Standing  Tandem Balance with Counter Support  - 2 x daily - 7 x weekly - 1 sets - 3 reps - 30 sec hold  Standing March with Counter Support  - 2 x daily - 7 x weekly - 2 sets - 10 reps    10/28/23: - Side Stepping with Resistance at Thighs and Counter Support  - 2 x daily - 7 x weekly - 3 sets - 10 reps - Single Leg Stance  - 2 x daily - 7 x weekly - 1 sets - 5 reps - 5" hold  GOALS: Goals reviewed with patient? No  SHORT TERM GOALS: Target date: 11/11/2023  Patient will be independent with managing initial HEP to improve self management and self care.  Baseline: Goal status: INITIAL  2.  Patient will be able confident in standing and walking for >20 minutes without AD in order to tolerate grocery shopping. Baseline:  Goal status: INITIAL   LONG TERM GOALS: Target date: 12/02/2023  Patient will improve 2 MWT to 400 feet w/o AD in order to demonstrate improved functional ambulatory capacity in community setting and promote return to  work. Baseline:  Goal status: INITIAL  2.  By discharge, patient will increase FOTO score by >10 points in order to improve functional capacity and tolerance to daily activity.  Baseline: 59 Goal status: INITIAL  3.  Patient will be able confident in standing and walking for >45 minutes without AD in order to tolerate demands of returning to work. Baseline:  Goal status: INITIAL  4.  Patient will demonstrate SLS > 15 sec bilaterally to improve balance and decrease risk of falls. Baseline: Right: 4 sec; Left: 6 sec Goal status: INITIAL  5.  Patient will improve R LE knee flexion and ankle dorsiflexion to 4+/5 to decrease risk of falls and improve functional strength.  Baseline:  Goal status: INITIAL  ASSESSMENT:  CLINICAL IMPRESSION: Started session with Nustep for a dynamic warm up.  Trial of ambulation with SPC but actually walks better without AD at all. Discussed and issued walking program as patient set to return to work in a month and mostly walks for an 8 hour shift.  Patient able to navigate steps safely with 1 handrail simulating her basement stairs at home.  Overall progressing well.  Patient will benefit from continued skilled therapy services  to address deficits and promote return to optimal function.      Eval:  Patient is a 47 y.o. female who was seen today for physical therapy evaluation and treatment for a right midbrain infarction. 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 She's been able to navigate her home without the use of her RW but takes it any time she's out in the community. Functional tests (5x STS and ) reveal good physical ability for an acute stroke. Patient has little difficulty with any transfers. She still notices a right pulsion which is slightly observed during and while seated during the evaluation. Single leg stance is more difficult on the right leg. SLS on both sides resulted in a LOB to the  right. LE MMTs also revealed slight right-sided weakness compared to the left. Throughout session, patient's speech was slow but fluent, not slurred. No difficulty with cognition, processing questions and commands.   OBJECTIVE IMPAIRMENTS: decreased balance, dizziness, and obesity.   ACTIVITY LIMITATIONS: caring for others  PARTICIPATION LIMITATIONS: cleaning, laundry, driving, shopping, and occupation  PERSONAL FACTORS: Transportation and  1-2 comorbidities: HTN, smoker  are also affecting patient's functional outcome.   REHAB POTENTIAL: Good  CLINICAL DECISION MAKING: Stable/uncomplicated  EVALUATION COMPLEXITY: Moderate  PLAN:  PT FREQUENCY: 1-2x/week  PT DURATION: 6 weeks  PLANNED INTERVENTIONS: 97164- PT Re-evaluation, 97110-Therapeutic exercises, 97530- Therapeutic activity, 97112- Neuromuscular re-education, 97535- Self Care, [id number]- Manual therapy, [phone]- Gait training, Balance training, and Stair training  PLAN FOR NEXT SESSION: Functional strength and balance activities.  Squats, lunges, warrior poses, stair training (then carrying stuff up/down stairs) etc...   1:42 PM, 11/04/23 Pernie Grosso Small Tyquarius Paglia MPT [facility] [place] [phone]

## 2023-11-06 ENCOUNTER — Encounter (HOSPITAL_COMMUNITY): Payer: BC Managed Care – PPO | Admitting: Occupational Therapy

## 2023-11-06 ENCOUNTER — Encounter (HOSPITAL_COMMUNITY): Payer: Self-pay

## 2023-11-06 ENCOUNTER — Ambulatory Visit (HOSPITAL_COMMUNITY): Payer: BC Managed Care – PPO

## 2023-11-06 DIAGNOSIS — R262 Difficulty in walking, not elsewhere classified: Secondary | ICD-10-CM

## 2023-11-06 DIAGNOSIS — I639 Cerebral infarction, unspecified: Secondary | ICD-10-CM

## 2023-11-06 DIAGNOSIS — R29818 Other symptoms and signs involving the nervous system: Secondary | ICD-10-CM

## 2023-11-06 DIAGNOSIS — Z7409 Other reduced mobility: Secondary | ICD-10-CM | POA: Diagnosis not present

## 2023-11-06 DIAGNOSIS — R278 Other lack of coordination: Secondary | ICD-10-CM | POA: Diagnosis not present

## 2023-11-06 NOTE — Therapy (Signed)
OUTPATIENT PHYSICAL THERAPY NEURO TREATMENT   Patient Name: Melissa Wilkerson MRN: [id number] DOB:Sep 20, 1976, 47 y.o., female Today's Date: 11/06/2023   PCP: Grayce Sessions, NP REFERRING PROVIDER: Charlton Amor, PA-C  END OF SESSION:  PT End of Session - 11/06/23 1301     Visit Number 5    Number of Visits 12    Date for PT Re-Evaluation 12/02/23    Authorization Type [employer]    PT Start Time 1302    PT Stop Time 1343    PT Time Calculation (min) 41 min    Activity Tolerance Patient tolerated treatment well    Behavior During Therapy [facility] for tasks assessed/performed             Past Medical History:  Diagnosis Date   Abdominal bloating 05/26/2015   Breast nodule 03/31/2015   Dyspareunia 05/26/2015   Essential hypertension, benign 09/28/2019   HLD (hyperlipidemia) 09/28/2019   Hypothyroidism, adult 09/28/2019   Vitamin D deficiency disease 09/28/2019   History reviewed. No pertinent surgical history. Patient Active Problem List   Diagnosis Date Noted   CVA (cerebral vascular accident) (HCC) 10/06/2023   Controlled type 2 diabetes mellitus with hyperglycemia, without long-term current use of insulin (HCC) 10/04/2023   Acute ischemic stroke (HCC) 10/02/2023   Acute CVA (cerebrovascular accident) (HCC) 10/01/2023   Pre-diabetes 10/01/2023   Encounter for screening fecal occult blood testing 12/01/2020   Encounter for gynecological examination with Papanicolaou smear of cervix 12/01/2020   Tobacco abuse 12/01/2020   Cigarette nicotine dependence without complication 09/20/2020   Essential hypertension, benign 09/28/2019   Hypothyroidism, adult 09/28/2019   Vitamin D deficiency disease 09/28/2019   HLD (hyperlipidemia) 09/28/2019   Abdominal bloating 05/26/2015   Dyspareunia 05/26/2015   Breast nodule 03/31/2015    ONSET DATE: 10/01/2023  REFERRING DIAG: I63.9 (ICD-10-CM) - Cerebral infarction, unspecified  THERAPY DIAG:  Difficulty in  walking, not elsewhere classified  Cerebrovascular accident (CVA), unspecified mechanism (HCC)  Other symptoms and signs involving the nervous system  Rationale for Evaluation and Treatment: Rehabilitation  SUBJECTIVE:                                                                                                                                                                                             SUBJECTIVE STATEMENT: 11/06/23:  Stated she has started walking program, has complete 10 min walks 3 times a day.  Pt to RTW mid January.  Eval:Patient presents to the clinic post CVA on 10/01/23. She states standing up from her recliner and fell right and caught herself on the coffee table. Noticed right side  weakness in UE, face and could tell something was off. Went to ED; discharged from CIR on 10/09/23. To date, patient has noticed balance problems when walking and turning. Not so much when standing. Prior to CVA she was completely independent will all ADLs and worked as a Brewing technologist; currently trying to get on disability. Notices leaning to the right and listing that way when walking; has left sided numbness and doesn't feel pain on that side in UE or LE. Can feel touch but not the pain from pinch. Sometimes her left arm is goes cold. Patient is doing well with most ADLs and has her husband and son help when needed; currently not driving. Patient is seeing family med NP this afternoon for the first session.  Pt accompanied by: family member; son in lobby; didn't come back for evaluation.   PERTINENT HISTORY: Smoker, HTN  PAIN:  Are you having pain? No; has some LBP and neck pain but doesn't attribute that to the CVA.  PRECAUTIONS: None  RED FLAGS: None   WEIGHT BEARING RESTRICTIONS: No  FALLS: Has patient fallen in last 6 months? No  LIVING ENVIRONMENT: Lives with: lives with their family, lives with their spouse, and lives with their son Lives in: House/apartment Stairs:  Yes: Internal: 1 steps; none and External: 1 steps; none Has following equipment at home: Walker - 4 wheeled  PLOF: Independent  PATIENT GOALS: Get back to work. Cleaning around the house.   OBJECTIVE:  Note: Objective measures were completed at Evaluation unless otherwise noted.  DIAGNOSTIC FINDINGS:  10/01/23 MRI FINDINGS: Brain: Acute infarct in the right midbrain. Slight edema without mass effect. Age advanced T2/FLAIR hyperintensities in the white matter. No acute hemorrhage, mass lesion, midline shift or hydrocephalus. Vascular: Major arterial flow voids are maintained skull base. Skull and upper cervical spine: Normal marrow signal. Sinuses/Orbits: Negative.   COGNITION: Overall cognitive status: Within functional limits for tasks assessed   SENSATION: WFL  COORDINATION: Appears WFL for UE and LE   MUSCLE TONE: LLE: Within functional limits   POSTURE: rounded shoulders and forward head    UE and LE AROM grossly WNL throughout UE MMTs grossly WNL throughout  LOWER EXTREMITY ROM:     Active  Right Eval Left Eval  Hip flexion    Hip extension    Hip abduction    Hip adduction    Hip internal rotation    Hip external rotation    Knee flexion    Knee extension    Ankle dorsiflexion    Ankle plantarflexion    Ankle inversion    Ankle eversion     (Blank rows = not tested)  LOWER EXTREMITY MMT:    MMT Right Eval Left Eval  Hip flexion 4+ 4+  Hip extension    Hip abduction    Hip adduction    Hip internal rotation    Hip external rotation    Knee flexion (sitting) 4 4  Knee extension 5 5  Ankle dorsiflexion 4 5  Ankle plantarflexion    Ankle inversion    Ankle eversion    (Blank rows = not tested)  BED MOBILITY:  Patient reports no issues with bed mobility  TRANSFERS: Assistive device utilized: None  Sit to stand: Complete Independence Stand to sit: Complete Independence Chair to chair:  Not tested; no issue reported Floor:  Not tested; no  issue reported  RAMP:  Level of Assistance: Modified independence Assistive device utilized: Walker - 4 wheeled and None Ramp  Comments:   CURB:  Level of Assistance: SBA Assistive device utilized: None Curb Comments: Patient mentions having to go slow and be intentional   GAIT: Gait pattern: WFL, step through pattern, and decreased stride length Distance walked: 500 feet in clinic Assistive device utilized: Walker - 4 wheeled Level of assistance: Modified independence Comments:   FUNCTIONAL TESTS:  5 times sit to stand: 12 second 2 minute walk test: 349 feet Single leg balance: Right: 4 sec; Left: 6 sec  PATIENT SURVEYS:  FOTO 59  TODAY'S TREATMENT:                                                                                                                              DATE:  11/06/23: no AD 452 ft Squat 10x with chair behind Heel raises 20x  Toe raises decline slope 20x Tandem stance with one foot on 6in step with yellow weight ball punch forward then overhead SLS Lt 37", Rt 32" Vector stance 3x 5" Lunge with no HHA 10x  Quadruped UE/LE 20x  Squat with yellow weight ball to heel raise 15x Stairs 5RT 7in step height carrying yellow weight ball no HR ascending, 1 HR descending  11/04/23 Nustep seat 5 x 5' dynamic warm up Gait training with SPC x 1 lap Gait training without AD x 1 lap Discussed and issued walking program Heel toe raises x 20 SLS x 30" each (intermittent UE assist) Tandem stance x 30" each no UE assist Sit to stand 2 x 10 no UE assist from low mat table 7" step navigation with 1 hand held assist/railing up and down x 3 Toe tap cones 2 sets of x 3 each no UE assist Step over cones x 3 each    10/30/23: Sit to stand to heel raise 10x Marching UE with opposite LE 10 Tandem stance with one foot on 6in 3x 30"  Forward step up 6 in 10-15 reps (pt nor therapist counted) Step down 6in 10-15x STS Rt foot behind SLS Lt 31", Rt 24" Vector  stance 3x 5" Tandem stance 3x 30" on foam Gait no AD 247ft, increased cadence and good heel to toe mechanics- cueing for arm swing Leg press 2x 10 4Pl  12/10:  Reviewed goals Educated importance of HEP complinace for maximal benefits 10 STS no HHA (WB L>Rt, cueing to equalize) 10x STS with Rt foot behind Standing: Tandem stance Tandem stance with 6in step 2x 30" Tandem stance with 6in step and UE flexion with 3# bar Alternating marching 10x 5" no HHA Toe tapping 6in 10x Toe tapping 6in opposite UE/LE SLS Rt 16", Lt 16" Abduction 10x 3" holds iwht RTB around thigh Sidestep RTB around thigh 5RT inside //bars no HHA Gait training with cueing to improve arm swing Vector stance with 1 HHA 3x 5" BLE  10/21/2023: Initial Evaluation and initiated HEP  PATIENT EDUCATION: Education details: Patient educated on exam findings, POC, scope of PT, HEP, and PT  prognosis. Person educated: Patient Education method: Explanation, Demonstration, and Handouts Education comprehension: verbalized understanding, returned demonstration, verbal cues required, and tactile cues required   HOME EXERCISE PROGRAM: Access Code: [email or web address]  URL: [email or web address]  Date: 10/21/2023  Prepared by: AP  Rehab Exercises  Sit to Stand  - 2 x daily - 7 x weekly - 1 sets - 10 reps  Standing Tandem Balance with Counter Support  - 2 x daily - 7 x weekly - 1 sets - 3 reps - 30 sec hold  Standing March with Counter Support  - 2 x daily - 7 x weekly - 2 sets - 10 reps    10/28/23: - Side Stepping with Resistance at Thighs and Counter Support  - 2 x daily - 7 x weekly - 3 sets - 10 reps - Single Leg Stance  - 2 x daily - 7 x weekly - 1 sets - 5 reps - 5" hold    11/05/23: Vector stance  GOALS: Goals reviewed with patient? No  SHORT TERM GOALS: Target date: 11/11/2023  Patient will be independent with managing initial HEP to improve self management and self care.  Baseline: Goal status:  INITIAL  2.  Patient will be able confident in standing and walking for >20 minutes without AD in order to tolerate grocery shopping. Baseline:  Goal status: INITIAL   LONG TERM GOALS: Target date: 12/02/2023  Patient will improve 2 MWT to 400 feet w/o AD in order to demonstrate improved functional ambulatory capacity in community setting and promote return to work. Baseline:  Goal status: INITIAL  2.  By discharge, patient will increase FOTO score by >10 points in order to improve functional capacity and tolerance to daily activity.  Baseline: 59 Goal status: INITIAL  3.  Patient will be able confident in standing and walking for >45 minutes without AD in order to tolerate demands of returning to work. Baseline:  Goal status: INITIAL  4.  Patient will demonstrate SLS > 15 sec bilaterally to improve balance and decrease risk of falls. Baseline: Right: 4 sec; Left: 6 sec Goal status: INITIAL  5.  Patient will improve R LE knee flexion and ankle dorsiflexion to 4+/5 to decrease risk of falls and improve functional strength.  Baseline:  Goal status: INITIAL  ASSESSMENT:  CLINICAL IMPRESSION: 11/06/23:  Pt progressing well towards goals.  Began session with without AD, presents with good gait mechanics and no LOB episodes.  Session focus with higher level balance activities and functional strengthening.  Pt able to demonstrate good proper lifting with squat and able to carry weighted ball up and down stairs.  Did require 1 HR descending stairs for safety.  No reports of pain through session and minimal fatigue.     Eval:  Patient is a 47 y.o. female who was seen today for physical therapy evaluation and treatment for a right midbrain infarction. 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 She's been able to navigate her home without the use of her RW but takes it any time she's out in the community. Functional tests (5x STS and  ) reveal good physical ability for an acute stroke. Patient has little difficulty with any transfers. She still notices a right pulsion which is slightly observed during and while seated during the evaluation. Single leg stance is more difficult on the right leg. SLS on both sides resulted in a LOB to the right. LE  MMTs also revealed slight right-sided weakness compared to the left. Throughout session, patient's speech was slow but fluent, not slurred. No difficulty with cognition, processing questions and commands.   OBJECTIVE IMPAIRMENTS: decreased balance, dizziness, and obesity.   ACTIVITY LIMITATIONS: caring for others  PARTICIPATION LIMITATIONS: cleaning, laundry, driving, shopping, and occupation  PERSONAL FACTORS: Transportation and 1-2 comorbidities: HTN, smoker  are also affecting patient's functional outcome.   REHAB POTENTIAL: Good  CLINICAL DECISION MAKING: Stable/uncomplicated  EVALUATION COMPLEXITY: Moderate  PLAN:  PT FREQUENCY: 1-2x/week  PT DURATION: 6 weeks  PLANNED INTERVENTIONS: 97164- PT Re-evaluation, 97110-Therapeutic exercises, 97530- Therapeutic activity, 97112- Neuromuscular re-education, 97535- Self Care, 16109- Manual therapy, [phone]- Gait training, Balance training, and Stair training  PLAN FOR NEXT SESSION: Functional strength and balance activities.  Squats, lunges, warrior poses, stair training (then carrying stuff up/down stairs) etc...   Becky Sax, LPTA/CLT; CBIS [phone]  Juel Burrow, PTA 11/06/2023, 4:41 PM  4:41 PM, 11/06/23

## 2023-11-11 ENCOUNTER — Ambulatory Visit (HOSPITAL_COMMUNITY): Payer: BC Managed Care – PPO

## 2023-11-11 ENCOUNTER — Encounter (HOSPITAL_COMMUNITY): Payer: BC Managed Care – PPO | Admitting: Occupational Therapy

## 2023-11-11 DIAGNOSIS — R29818 Other symptoms and signs involving the nervous system: Secondary | ICD-10-CM | POA: Diagnosis not present

## 2023-11-11 DIAGNOSIS — I639 Cerebral infarction, unspecified: Secondary | ICD-10-CM | POA: Diagnosis not present

## 2023-11-11 DIAGNOSIS — R278 Other lack of coordination: Secondary | ICD-10-CM

## 2023-11-11 DIAGNOSIS — Z7409 Other reduced mobility: Secondary | ICD-10-CM

## 2023-11-11 DIAGNOSIS — R262 Difficulty in walking, not elsewhere classified: Secondary | ICD-10-CM | POA: Diagnosis not present

## 2023-11-11 NOTE — Therapy (Signed)
OUTPATIENT PHYSICAL THERAPY NEURO TREATMENT   Patient Name: Melissa Wilkerson MRN: [id number] DOB:[birth year], 47 y.o., female Today's Date: 11/11/2023   PCP: Grayce Sessions, NP REFERRING PROVIDER: Charlton Amor, PA-C  END OF SESSION:  PT End of Session - 11/11/23 1011     Visit Number 6    Number of Visits 12    Date for PT Re-Evaluation 12/02/23    Authorization Type [employer]    PT Start Time 1011    PT Stop Time 1051    PT Time Calculation (min) 40 min    Activity Tolerance Patient tolerated treatment well    Behavior During Therapy [facility] for tasks assessed/performed             Past Medical History:  Diagnosis Date   Abdominal bloating 05/26/2015   Breast nodule 03/31/2015   Dyspareunia 05/26/2015   Essential hypertension, benign 09/28/2019   HLD (hyperlipidemia) 09/28/2019   Hypothyroidism, adult 09/28/2019   Vitamin D deficiency disease 09/28/2019   No past surgical history on file. Patient Active Problem List   Diagnosis Date Noted   CVA (cerebral vascular accident) (HCC) 10/06/2023   Controlled type 2 diabetes mellitus with hyperglycemia, without long-term current use of insulin (HCC) 10/04/2023   Acute ischemic stroke (HCC) 10/02/2023   Acute CVA (cerebrovascular accident) (HCC) 10/01/2023   Pre-diabetes 10/01/2023   Encounter for screening fecal occult blood testing 12/01/2020   Encounter for gynecological examination with Papanicolaou smear of cervix 12/01/2020   Tobacco abuse 12/01/2020   Cigarette nicotine dependence without complication 09/20/2020   Essential hypertension, benign 09/28/2019   Hypothyroidism, adult 09/28/2019   Vitamin D deficiency disease 09/28/2019   HLD (hyperlipidemia) 09/28/2019   Abdominal bloating 05/26/2015   Dyspareunia 05/26/2015   Breast nodule 03/31/2015    ONSET DATE: 10/01/2023  REFERRING DIAG: I63.9 (ICD-10-CM) - Cerebral infarction, unspecified  THERAPY DIAG:  Difficulty in walking, not  elsewhere classified  Cerebrovascular accident (CVA), unspecified mechanism (HCC)  Other symptoms and signs involving the nervous system  Other lack of coordination  Impaired functional mobility, balance, gait, and endurance  Rationale for Evaluation and Treatment: Rehabilitation  SUBJECTIVE:                                                                                                                                                                                             SUBJECTIVE STATEMENT: Is up to walking 12 minutes 3 times a day. Arrives without AD.   Eval:Patient presents to the clinic post CVA on 10/01/23. She states standing up from her recliner and fell right and caught herself on the coffee  table. Noticed right side weakness in UE, face and could tell something was off. Went to ED; discharged from CIR on 10/09/23. To date, patient has noticed balance problems when walking and turning. Not so much when standing. Prior to CVA she was completely independent will all ADLs and worked as a Brewing technologist; currently trying to get on disability. Notices leaning to the right and listing that way when walking; has left sided numbness and doesn't feel pain on that side in UE or LE. Can feel touch but not the pain from pinch. Sometimes her left arm is goes cold. Patient is doing well with most ADLs and has her husband and son help when needed; currently not driving. Patient is seeing family med NP this afternoon for the first session.  Pt accompanied by: family member; son in lobby; didn't come back for evaluation.   PERTINENT HISTORY: Smoker, HTN  PAIN:  Are you having pain? No; has some LBP and neck pain but doesn't attribute that to the CVA.  PRECAUTIONS: None  RED FLAGS: None   WEIGHT BEARING RESTRICTIONS: No  FALLS: Has patient fallen in last 6 months? No  LIVING ENVIRONMENT: Lives with: lives with their family, lives with their spouse, and lives with their son Lives in:  House/apartment Stairs: Yes: Internal: 1 steps; none and External: 1 steps; none Has following equipment at home: Walker - 4 wheeled  PLOF: Independent  PATIENT GOALS: Get back to work. Cleaning around the house.   OBJECTIVE:  Note: Objective measures were completed at Evaluation unless otherwise noted.  DIAGNOSTIC FINDINGS:  10/01/23 MRI FINDINGS: Brain: Acute infarct in the right midbrain. Slight edema without mass effect. Age advanced T2/FLAIR hyperintensities in the white matter. No acute hemorrhage, mass lesion, midline shift or hydrocephalus. Vascular: Major arterial flow voids are maintained skull base. Skull and upper cervical spine: Normal marrow signal. Sinuses/Orbits: Negative.   COGNITION: Overall cognitive status: Within functional limits for tasks assessed   SENSATION: WFL  COORDINATION: Appears WFL for UE and LE   MUSCLE TONE: LLE: Within functional limits   POSTURE: rounded shoulders and forward head    UE and LE AROM grossly WNL throughout UE MMTs grossly WNL throughout  LOWER EXTREMITY ROM:     Active  Right Eval Left Eval  Hip flexion    Hip extension    Hip abduction    Hip adduction    Hip internal rotation    Hip external rotation    Knee flexion    Knee extension    Ankle dorsiflexion    Ankle plantarflexion    Ankle inversion    Ankle eversion     (Blank rows = not tested)  LOWER EXTREMITY MMT:    MMT Right Eval Left Eval  Hip flexion 4+ 4+  Hip extension    Hip abduction    Hip adduction    Hip internal rotation    Hip external rotation    Knee flexion (sitting) 4 4  Knee extension 5 5  Ankle dorsiflexion 4 5  Ankle plantarflexion    Ankle inversion    Ankle eversion    (Blank rows = not tested)  BED MOBILITY:  Patient reports no issues with bed mobility  TRANSFERS: Assistive device utilized: None  Sit to stand: Complete Independence Stand to sit: Complete Independence Chair to chair:  Not tested; no issue  reported Floor:  Not tested; no issue reported  RAMP:  Level of Assistance: Modified independence Assistive device utilized: Walker - 4  wheeled and None Ramp Comments:   CURB:  Level of Assistance: SBA Assistive device utilized: None Curb Comments: Patient mentions having to go slow and be intentional   GAIT: Gait pattern: WFL, step through pattern, and decreased stride length Distance walked: 500 feet in clinic Assistive device utilized: Walker - 4 wheeled Level of assistance: Modified independence Comments:   FUNCTIONAL TESTS:  5 times sit to stand: 12 second 2 minute walk test: 349 feet Single leg balance: Right: 4 sec; Left: 6 sec  PATIENT SURVEYS:  FOTO 59  TODAY'S TREATMENT:                                                                                                                              DATE:  11/11/23 2 laps around PT gym no AD Sit to stand no UE assist x 10 Sit to stand on foam x 10 no UE assist Sit to stand on foam with left foot advanced no UE assist x 10 Sit to stand on foam with left foot advanced with yellow med ball x 10 Heel raises on incline 2 x 10 Toe raises on decline 2 x 10 Stepping over tall hurdles x 4 holding 3# weights 4 reps down and back Tandem walking 10 ft line down and back x 2 Warrior ll pose 2 x 5 each (flexing front knee) holding last rep x 10 sec Leg press bodycraft 4 plates 2 x 10 Single leg press 2 plates right leg x 10 End with Nustep seat 5 level 3 conditioning    11/06/23: no AD 452 ft Squat 10x with chair behind Heel raises 20x  Toe raises decline slope 20x Tandem stance with one foot on 6in step with yellow weight ball punch forward then overhead SLS Lt 37", Rt 32" Vector stance 3x 5" Lunge with no HHA 10x  Quadruped UE/LE 20x  Squat with yellow weight ball to heel raise 15x Stairs 5RT 7in step height carrying yellow weight ball no HR ascending, 1 HR descending  11/04/23 Nustep seat 5 x 5' dynamic  warm up Gait training with SPC x 1 lap Gait training without AD x 1 lap Discussed and issued walking program Heel toe raises x 20 SLS x 30" each (intermittent UE assist) Tandem stance x 30" each no UE assist Sit to stand 2 x 10 no UE assist from low mat table 7" step navigation with 1 hand held assist/railing up and down x 3 Toe tap cones 2 sets of x 3 each no UE assist Step over cones x 3 each    10/30/23: Sit to stand to heel raise 10x Marching UE with opposite LE 10 Tandem stance with one foot on 6in 3x 30"  Forward step up 6 in 10-15 reps (pt nor therapist counted) Step down 6in 10-15x STS Rt foot behind SLS Lt 31", Rt 24" Vector stance 3x 5" Tandem stance 3x 30" on foam Gait no AD 25ft, increased cadence  and good heel to toe mechanics- cueing for arm swing Leg press 2x 10 4Pl  12/10:  Reviewed goals Educated importance of HEP complinace for maximal benefits 10 STS no HHA (WB L>Rt, cueing to equalize) 10x STS with Rt foot behind Standing: Tandem stance Tandem stance with 6in step 2x 30" Tandem stance with 6in step and UE flexion with 3# bar Alternating marching 10x 5" no HHA Toe tapping 6in 10x Toe tapping 6in opposite UE/LE SLS Rt 16", Lt 16" Abduction 10x 3" holds iwht RTB around thigh Sidestep RTB around thigh 5RT inside //bars no HHA Gait training with cueing to improve arm swing Vector stance with 1 HHA 3x 5" BLE  10/21/2023: Initial Evaluation and initiated HEP  PATIENT EDUCATION: Education details: Patient educated on exam findings, POC, scope of PT, HEP, and PT prognosis. Person educated: Patient Education method: Explanation, Demonstration, and Handouts Education comprehension: verbalized understanding, returned demonstration, verbal cues required, and tactile cues required   HOME EXERCISE PROGRAM: 11/11/23 warrior 2 and heel/toe walking Access Code: [email or web address]  URL: [email or web address]  Date: 10/21/2023  Prepared by: AP  Rehab  Exercises  Sit to Stand  - 2 x daily - 7 x weekly - 1 sets - 10 reps  Standing Tandem Balance with Counter Support  - 2 x daily - 7 x weekly - 1 sets - 3 reps - 30 sec hold  Standing March with Counter Support  - 2 x daily - 7 x weekly - 2 sets - 10 reps    10/28/23: - Side Stepping with Resistance at Thighs and Counter Support  - 2 x daily - 7 x weekly - 3 sets - 10 reps - Single Leg Stance  - 2 x daily - 7 x weekly - 1 sets - 5 reps - 5" hold    11/05/23: Vector stance  GOALS: Goals reviewed with patient? No  SHORT TERM GOALS: Target date: 11/11/2023  Patient will be independent with managing initial HEP to improve self management and self care.  Baseline: Goal status: INITIAL  2.  Patient will be able confident in standing and walking for >20 minutes without AD in order to tolerate grocery shopping. Baseline:  Goal status: INITIAL   LONG TERM GOALS: Target date: 12/02/2023  Patient will improve 2 MWT to 400 feet w/o AD in order to demonstrate improved functional ambulatory capacity in community setting and promote return to work. Baseline:  Goal status: INITIAL  2.  By discharge, patient will increase FOTO score by >10 points in order to improve functional capacity and tolerance to daily activity.  Baseline: 59 Goal status: INITIAL  3.  Patient will be able confident in standing and walking for >45 minutes without AD in order to tolerate demands of returning to work. Baseline:  Goal status: INITIAL  4.  Patient will demonstrate SLS > 15 sec bilaterally to improve balance and decrease risk of falls. Baseline: Right: 4 sec; Left: 6 sec Goal status: INITIAL  5.  Patient will improve R LE knee flexion and ankle dorsiflexion to 4+/5 to decrease risk of falls and improve functional strength.  Baseline:  Goal status: INITIAL  ASSESSMENT:  CLINICAL IMPRESSION: Started today with 2 laps around PT gym without AD; has a mild limp; decreased stance right lower extremity.  Added  warrior pose and heel to walking today; both mild to moderately challenging; added to HEP.  Added leg press for strengthening.  Patient will benefit from continued skilled therapy services to address deficits and promote  return to optimal function.       Eval:  Patient is a 47 y.o. female who was seen today for physical therapy evaluation and treatment for a right midbrain infarction. 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 She's been able to navigate her home without the use of her RW but takes it any time she's out in the community. Functional tests (5x STS and ) reveal good physical ability for an acute stroke. Patient has little difficulty with any transfers. She still notices a right pulsion which is slightly observed during and while seated during the evaluation. Single leg stance is more difficult on the right leg. SLS on both sides resulted in a LOB to the right. LE MMTs also revealed slight right-sided weakness compared to the left. Throughout session, patient's speech was slow but fluent, not slurred. No difficulty with cognition, processing questions and commands.   OBJECTIVE IMPAIRMENTS: decreased balance, dizziness, and obesity.   ACTIVITY LIMITATIONS: caring for others  PARTICIPATION LIMITATIONS: cleaning, laundry, driving, shopping, and occupation  PERSONAL FACTORS: Transportation and 1-2 comorbidities: HTN, smoker  are also affecting patient's functional outcome.   REHAB POTENTIAL: Good  CLINICAL DECISION MAKING: Stable/uncomplicated  EVALUATION COMPLEXITY: Moderate  PLAN:  PT FREQUENCY: 1-2x/week  PT DURATION: 6 weeks  PLANNED INTERVENTIONS: [id number]- PT Re-evaluation, 97110-Therapeutic exercises, 97530- Therapeutic activity, 97112- Neuromuscular re-education, 97535- Self Care, [id number]- Manual therapy, [phone]- Gait training, Balance training, and Stair training  PLAN FOR NEXT SESSION: Functional strength and  balance activities.  Squats, lunges, warrior poses, stair training (then carrying stuff up/down stairs) etc... add BOSU ball next RX lunge onto; squat  10:50 AM, 11/11/23 Merit Maybee Small Soren Lazarz MPT [facility] [place] [phone] [phone]

## 2023-11-13 ENCOUNTER — Ambulatory Visit (HOSPITAL_COMMUNITY): Payer: BC Managed Care – PPO

## 2023-11-13 ENCOUNTER — Encounter (HOSPITAL_COMMUNITY): Payer: BC Managed Care – PPO | Admitting: Occupational Therapy

## 2023-11-14 ENCOUNTER — Ambulatory Visit (HOSPITAL_COMMUNITY): Payer: BC Managed Care – PPO

## 2023-11-14 DIAGNOSIS — Z7409 Other reduced mobility: Secondary | ICD-10-CM | POA: Diagnosis not present

## 2023-11-14 DIAGNOSIS — R29818 Other symptoms and signs involving the nervous system: Secondary | ICD-10-CM | POA: Diagnosis not present

## 2023-11-14 DIAGNOSIS — I639 Cerebral infarction, unspecified: Secondary | ICD-10-CM

## 2023-11-14 DIAGNOSIS — R278 Other lack of coordination: Secondary | ICD-10-CM

## 2023-11-14 DIAGNOSIS — R262 Difficulty in walking, not elsewhere classified: Secondary | ICD-10-CM | POA: Diagnosis not present

## 2023-11-14 NOTE — Therapy (Signed)
OUTPATIENT PHYSICAL THERAPY NEURO TREATMENT   Patient Name: Melissa Wilkerson MRN: [id number] DOB:[birth year], 47 y.o., female Today's Date: 11/14/2023   PCP: Grayce Sessions, NP REFERRING PROVIDER: Charlton Amor, PA-C  END OF SESSION:  PT End of Session - 11/14/23 0929     Visit Number 7    Number of Visits 12    Date for PT Re-Evaluation 12/02/23    Authorization Type [employer]    PT Start Time 0930    PT Stop Time 1010    PT Time Calculation (min) 40 min    Activity Tolerance Patient tolerated treatment well    Behavior During Therapy [facility] for tasks assessed/performed             Past Medical History:  Diagnosis Date   Abdominal bloating 05/26/2015   Breast nodule 03/31/2015   Dyspareunia 05/26/2015   Essential hypertension, benign 09/28/2019   HLD (hyperlipidemia) 09/28/2019   Hypothyroidism, adult 09/28/2019   Vitamin D deficiency disease 09/28/2019   No past surgical history on file. Patient Active Problem List   Diagnosis Date Noted   CVA (cerebral vascular accident) (HCC) 10/06/2023   Controlled type 2 diabetes mellitus with hyperglycemia, without long-term current use of insulin (HCC) 10/04/2023   Acute ischemic stroke (HCC) 10/02/2023   Acute CVA (cerebrovascular accident) (HCC) 10/01/2023   Pre-diabetes 10/01/2023   Encounter for screening fecal occult blood testing 12/01/2020   Encounter for gynecological examination with Papanicolaou smear of cervix 12/01/2020   Tobacco abuse 12/01/2020   Cigarette nicotine dependence without complication 09/20/2020   Essential hypertension, benign 09/28/2019   Hypothyroidism, adult 09/28/2019   Vitamin D deficiency disease 09/28/2019   HLD (hyperlipidemia) 09/28/2019   Abdominal bloating 05/26/2015   Dyspareunia 05/26/2015   Breast nodule 03/31/2015    ONSET DATE: 10/01/2023  REFERRING DIAG: I63.9 (ICD-10-CM) - Cerebral infarction, unspecified  THERAPY DIAG:  Difficulty in walking, not  elsewhere classified  Cerebrovascular accident (CVA), unspecified mechanism (HCC)  Other symptoms and signs involving the nervous system  Other lack of coordination  Impaired functional mobility, balance, gait, and endurance  Rationale for Evaluation and Treatment: Rehabilitation  SUBJECTIVE:                                                                                                                                                                                             SUBJECTIVE STATEMENT:  Arrives without AD; feeling good   Eval:Patient presents to the clinic post CVA on 10/01/23. She states standing up from her recliner and fell right and caught herself on the coffee table. Noticed right side weakness in UE,  face and could tell something was off. Went to ED; discharged from CIR on 10/09/23. To date, patient has noticed balance problems when walking and turning. Not so much when standing. Prior to CVA she was completely independent will all ADLs and worked as a Brewing technologist; currently trying to get on disability. Notices leaning to the right and listing that way when walking; has left sided numbness and doesn't feel pain on that side in UE or LE. Can feel touch but not the pain from pinch. Sometimes her left arm is goes cold. Patient is doing well with most ADLs and has her husband and son help when needed; currently not driving. Patient is seeing family med NP this afternoon for the first session.  Pt accompanied by: family member; son in lobby; didn't come back for evaluation.   PERTINENT HISTORY: Smoker, HTN  PAIN:  Are you having pain? No; has some LBP and neck pain but doesn't attribute that to the CVA.  PRECAUTIONS: None  RED FLAGS: None   WEIGHT BEARING RESTRICTIONS: No  FALLS: Has patient fallen in last 6 months? No  LIVING ENVIRONMENT: Lives with: lives with their family, lives with their spouse, and lives with their son Lives in: House/apartment Stairs: Yes:  Internal: 1 steps; none and External: 1 steps; none Has following equipment at home: Walker - 4 wheeled  PLOF: Independent  PATIENT GOALS: Get back to work. Cleaning around the house.   OBJECTIVE:  Note: Objective measures were completed at Evaluation unless otherwise noted.  DIAGNOSTIC FINDINGS:  10/01/23 MRI FINDINGS: Brain: Acute infarct in the right midbrain. Slight edema without mass effect. Age advanced T2/FLAIR hyperintensities in the white matter. No acute hemorrhage, mass lesion, midline shift or hydrocephalus. Vascular: Major arterial flow voids are maintained skull base. Skull and upper cervical spine: Normal marrow signal. Sinuses/Orbits: Negative.   COGNITION: Overall cognitive status: Within functional limits for tasks assessed   SENSATION: WFL  COORDINATION: Appears WFL for UE and LE   MUSCLE TONE: LLE: Within functional limits   POSTURE: rounded shoulders and forward head    UE and LE AROM grossly WNL throughout UE MMTs grossly WNL throughout  LOWER EXTREMITY ROM:     Active  Right Eval Left Eval  Hip flexion    Hip extension    Hip abduction    Hip adduction    Hip internal rotation    Hip external rotation    Knee flexion    Knee extension    Ankle dorsiflexion    Ankle plantarflexion    Ankle inversion    Ankle eversion     (Blank rows = not tested)  LOWER EXTREMITY MMT:    MMT Right Eval Left Eval  Hip flexion 4+ 4+  Hip extension    Hip abduction    Hip adduction    Hip internal rotation    Hip external rotation    Knee flexion (sitting) 4 4  Knee extension 5 5  Ankle dorsiflexion 4 5  Ankle plantarflexion    Ankle inversion    Ankle eversion    (Blank rows = not tested)  BED MOBILITY:  Patient reports no issues with bed mobility  TRANSFERS: Assistive device utilized: None  Sit to stand: Complete Independence Stand to sit: Complete Independence Chair to chair:  Not tested; no issue reported Floor:  Not tested; no  issue reported  RAMP:  Level of Assistance: Modified independence Assistive device utilized: Walker - 4 wheeled and None Ramp Comments:  CURB:  Level of Assistance: SBA Assistive device utilized: None Curb Comments: Patient mentions having to go slow and be intentional   GAIT: Gait pattern: WFL, step through pattern, and decreased stride length Distance walked: 500 feet in clinic Assistive device utilized: Walker - 4 wheeled Level of assistance: Modified independence Comments:   FUNCTIONAL TESTS:  5 times sit to stand: 12 second 2 minute walk test: 349 feet Single leg balance: Right: 4 sec; Left: 6 sec  PATIENT SURVEYS:  FOTO 59  TODAY'S TREATMENT:                                                                                                                              DATE:  11/14/23 2 laps around PT gym no AD; focus on improved heel strike and toe off Nustep seat 5 x 5' level 2  Heel raises on incline 2 x 10 Toe raises on decline 2 x 10 Sit to stand on foam with left foot advanced and holding yellow med ball 3 x 10 Alternating lunge on BOSU 2 x 10 no UE assist Leg press 4 plates both and 2 plates right only 2 x 10 each    11/11/23 2 laps around PT gym no AD Sit to stand no UE assist x 10 Sit to stand on foam x 10 no UE assist Sit to stand on foam with left foot advanced no UE assist x 10 Sit to stand on foam with left foot advanced with yellow med ball x 10 Heel raises on incline 2 x 10 Toe raises on decline 2 x 10 Stepping over tall hurdles x 4 holding 3# weights 4 reps down and back Tandem walking 10 ft line down and back x 2 Warrior ll pose 2 x 5 each (flexing front knee) holding last rep x 10 sec Leg press bodycraft 4 plates 2 x 10 Single leg press 2 plates right leg x 10 End with Nustep seat 5 level 3 conditioning    11/06/23: no AD 452 ft Squat 10x with chair behind Heel raises 20x  Toe raises decline slope 20x Tandem stance with one foot  on 6in step with yellow weight ball punch forward then overhead SLS Lt 37", Rt 32" Vector stance 3x 5" Lunge with no HHA 10x  Quadruped UE/LE 20x  Squat with yellow weight ball to heel raise 15x Stairs 5RT 7in step height carrying yellow weight ball no HR ascending, 1 HR descending  11/04/23 Nustep seat 5 x 5' dynamic warm up Gait training with SPC x 1 lap Gait training without AD x 1 lap Discussed and issued walking program Heel toe raises x 20 SLS x 30" each (intermittent UE assist) Tandem stance x 30" each no UE assist Sit to stand 2 x 10 no UE assist from low mat table 7" step navigation with 1 hand held assist/railing up and down x 3 Toe tap cones 2 sets of x 3 each no  UE assist Step over cones x 3 each    10/21/2023: Initial Evaluation and initiated HEP  PATIENT EDUCATION: Education details: Patient educated on exam findings, POC, scope of PT, HEP, and PT prognosis. Person educated: Patient Education method: Explanation, Demonstration, and Handouts Education comprehension: verbalized understanding, returned demonstration, verbal cues required, and tactile cues required   HOME EXERCISE PROGRAM: 11/11/23 warrior 2 and heel/toe walking Access Code: [email or web address]  URL: [email or web address]  Date: 10/21/2023  Prepared by: AP  Rehab Exercises  Sit to Stand  - 2 x daily - 7 x weekly - 1 sets - 10 reps  Standing Tandem Balance with Counter Support  - 2 x daily - 7 x weekly - 1 sets - 3 reps - 30 sec hold  Standing March with Counter Support  - 2 x daily - 7 x weekly - 2 sets - 10 reps    10/28/23: - Side Stepping with Resistance at Thighs and Counter Support  - 2 x daily - 7 x weekly - 3 sets - 10 reps - Single Leg Stance  - 2 x daily - 7 x weekly - 1 sets - 5 reps - 5" hold    11/05/23: Vector stance  GOALS: Goals reviewed with patient? No  SHORT TERM GOALS: Target date: 11/11/2023  Patient will be independent with managing initial HEP to improve self  management and self care.  Baseline: Goal status: INITIAL  2.  Patient will be able confident in standing and walking for >20 minutes without AD in order to tolerate grocery shopping. Baseline:  Goal status: INITIAL   LONG TERM GOALS: Target date: 12/02/2023  Patient will improve 2 MWT to 400 feet w/o AD in order to demonstrate improved functional ambulatory capacity in community setting and promote return to work. Baseline:  Goal status: INITIAL  2.  By discharge, patient will increase FOTO score by >10 points in order to improve functional capacity and tolerance to daily activity.  Baseline: 59 Goal status: INITIAL  3.  Patient will be able confident in standing and walking for >45 minutes without AD in order to tolerate demands of returning to work. Baseline:  Goal status: INITIAL  4.  Patient will demonstrate SLS > 15 sec bilaterally to improve balance and decrease risk of falls. Baseline: Right: 4 sec; Left: 6 sec Goal status: INITIAL  5.  Patient will improve R LE knee flexion and ankle dorsiflexion to 4+/5 to decrease risk of falls and improve functional strength.  Baseline:  Goal status: INITIAL  ASSESSMENT:  CLINICAL IMPRESSION: Started today with 2 laps around PT gym without AD; needs cues for increased arm swing and improved heel strike and toe off.  Limp more prominent as she fatigues. Increased reps of leg press without issue.  Added BOSU ball lunges for balance and strength.  Encouraged patient to continue walking program as that is what she does at work mostly.    Patient will benefit from continued skilled therapy services to address deficits and promote return to optimal function.       Eval:  Patient is a 47 y.o. female who was seen today for physical therapy evaluation and treatment for a right midbrain infarction. 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 She's been able to navigate her home without the use  of her RW but takes it any time she's out in the community. Functional tests (5x STS and ) reveal good physical ability  for an acute stroke. Patient has little difficulty with any transfers. She still notices a right pulsion which is slightly observed during and while seated during the evaluation. Single leg stance is more difficult on the right leg. SLS on both sides resulted in a LOB to the right. LE MMTs also revealed slight right-sided weakness compared to the left. Throughout session, patient's speech was slow but fluent, not slurred. No difficulty with cognition, processing questions and commands.   OBJECTIVE IMPAIRMENTS: decreased balance, dizziness, and obesity.   ACTIVITY LIMITATIONS: caring for others  PARTICIPATION LIMITATIONS: cleaning, laundry, driving, shopping, and occupation  PERSONAL FACTORS: Transportation and 1-2 comorbidities: HTN, smoker  are also affecting patient's functional outcome.   REHAB POTENTIAL: Good  CLINICAL DECISION MAKING: Stable/uncomplicated  EVALUATION COMPLEXITY: Moderate  PLAN:  PT FREQUENCY: 1-2x/week  PT DURATION: 6 weeks  PLANNED INTERVENTIONS: 97164- PT Re-evaluation, 97110-Therapeutic exercises, 97530- Therapeutic activity, 97112- Neuromuscular re-education, 97535- Self Care, [id number]- Manual therapy, [phone]- Gait training, Balance training, and Stair training  PLAN FOR NEXT SESSION: Functional strength and balance activities.  Squats, lunges, warrior poses, stair training (then carrying stuff up/down stairs) etc... add BOSU ball next RX lunge onto; squat; sees rehab MD 11/30/22   10:10 AM, 11/14/23 Sherina Stammer Small Edwardo Wojnarowski MPT [facility] [place] [phone] Ph:[phone]

## 2023-11-18 ENCOUNTER — Ambulatory Visit (HOSPITAL_COMMUNITY): Payer: BC Managed Care – PPO

## 2023-11-18 DIAGNOSIS — R29818 Other symptoms and signs involving the nervous system: Secondary | ICD-10-CM

## 2023-11-18 DIAGNOSIS — Z7409 Other reduced mobility: Secondary | ICD-10-CM

## 2023-11-18 DIAGNOSIS — R278 Other lack of coordination: Secondary | ICD-10-CM

## 2023-11-18 DIAGNOSIS — I639 Cerebral infarction, unspecified: Secondary | ICD-10-CM

## 2023-11-18 DIAGNOSIS — R262 Difficulty in walking, not elsewhere classified: Secondary | ICD-10-CM | POA: Diagnosis not present

## 2023-11-18 NOTE — Therapy (Signed)
 OUTPATIENT PHYSICAL THERAPY NEURO TREATMENT   Patient Name: Melissa Wilkerson MRN: [id number] DOB:1976/05/01, 47 y.o., female Today's Date: 11/18/2023   PCP: Celestia Rosaline SQUIBB, NP REFERRING PROVIDER: Pegge Toribio PARAS, PA-C  END OF SESSION:  PT End of Session - 11/18/23 1300     Visit Number 8    Number of Visits 12    Date for PT Re-Evaluation 12/02/23    Authorization Type [employer]    PT Start Time [phone]    PT Stop Time 1341    PT Time Calculation (min) 40 min    Activity Tolerance Patient tolerated treatment well    Behavior During Therapy [facility] for tasks assessed/performed             Past Medical History:  Diagnosis Date   Abdominal bloating 05/26/2015   Breast nodule 03/31/2015   Dyspareunia 05/26/2015   Essential hypertension, benign 09/28/2019   HLD (hyperlipidemia) 09/28/2019   Hypothyroidism, adult 09/28/2019   Vitamin D  deficiency disease 09/28/2019   No past surgical history on file. Patient Active Problem List   Diagnosis Date Noted   CVA (cerebral vascular accident) (HCC) 10/06/2023   Controlled type 2 diabetes mellitus with hyperglycemia, without long-term current use of insulin  (HCC) 10/04/2023   Acute ischemic stroke (HCC) 10/02/2023   Acute CVA (cerebrovascular accident) (HCC) 10/01/2023   Pre-diabetes 10/01/2023   Encounter for screening fecal occult blood testing 12/01/2020   Encounter for gynecological examination with Papanicolaou smear of cervix 12/01/2020   Tobacco abuse 12/01/2020   Cigarette nicotine dependence without complication 09/20/2020   Essential hypertension, benign 09/28/2019   Hypothyroidism, adult 09/28/2019   Vitamin D  deficiency disease 09/28/2019   HLD (hyperlipidemia) 09/28/2019   Abdominal bloating 05/26/2015   Dyspareunia 05/26/2015   Breast nodule 03/31/2015    ONSET DATE: 10/01/2023  REFERRING DIAG: I63.9 (ICD-10-CM) - Cerebral infarction, unspecified  THERAPY DIAG:  Difficulty in walking, not  elsewhere classified  Cerebrovascular accident (CVA), unspecified mechanism (HCC)  Other symptoms and signs involving the nervous system  Other lack of coordination  Impaired functional mobility, balance, gait, and endurance  Rationale for Evaluation and Treatment: Rehabilitation  SUBJECTIVE:                                                                                                                                                                                             SUBJECTIVE STATEMENT:  Now able to walk  for 15 minutes without stopping.     Eval:Patient presents to the clinic post CVA on 10/01/23. She states standing up from her recliner and fell right and caught herself on the coffee  table. Noticed right side weakness in UE, face and could tell something was off. Went to ED; discharged from CIR on 10/09/23. To date, patient has noticed balance problems when walking and turning. Not so much when standing. Prior to CVA she was completely independent will all ADLs and worked as a brewing technologist; currently trying to get on disability. Notices leaning to the right and listing that way when walking; has left sided numbness and doesn't feel pain on that side in UE or LE. Can feel touch but not the pain from pinch. Sometimes her left arm is goes cold. Patient is doing well with most ADLs and has her husband and son help when needed; currently not driving. Patient is seeing family med NP this afternoon for the first session.  Pt accompanied by: family member; son in lobby; didn't come back for evaluation.   PERTINENT HISTORY: Smoker, HTN  PAIN:  Are you having pain? No; has some LBP and neck pain but doesn't attribute that to the CVA.  PRECAUTIONS: None  RED FLAGS: None   WEIGHT BEARING RESTRICTIONS: No  FALLS: Has patient fallen in last 6 months? No  LIVING ENVIRONMENT: Lives with: lives with their family, lives with their spouse, and lives with their son Lives in:  House/apartment Stairs: Yes: Internal: 1 steps; none and External: 1 steps; none Has following equipment at home: Walker - 4 wheeled  PLOF: Independent  PATIENT GOALS: Get back to work. Cleaning around the house.   OBJECTIVE:  Note: Objective measures were completed at Evaluation unless otherwise noted.  DIAGNOSTIC FINDINGS:  10/01/23 MRI FINDINGS: Brain: Acute infarct in the right midbrain. Slight edema without mass effect. Age advanced T2/FLAIR hyperintensities in the white matter. No acute hemorrhage, mass lesion, midline shift or hydrocephalus. Vascular: Major arterial flow voids are maintained skull base. Skull and upper cervical spine: Normal marrow signal. Sinuses/Orbits: Negative.   COGNITION: Overall cognitive status: Within functional limits for tasks assessed   SENSATION: WFL  COORDINATION: Appears WFL for UE and LE   MUSCLE TONE: LLE: Within functional limits   POSTURE: rounded shoulders and forward head    UE and LE AROM grossly WNL throughout UE MMTs grossly WNL throughout  LOWER EXTREMITY ROM:     Active  Right Eval Left Eval  Hip flexion    Hip extension    Hip abduction    Hip adduction    Hip internal rotation    Hip external rotation    Knee flexion    Knee extension    Ankle dorsiflexion    Ankle plantarflexion    Ankle inversion    Ankle eversion     (Blank rows = not tested)  LOWER EXTREMITY MMT:    MMT Right Eval Left Eval  Hip flexion 4+ 4+  Hip extension    Hip abduction    Hip adduction    Hip internal rotation    Hip external rotation    Knee flexion (sitting) 4 4  Knee extension 5 5  Ankle dorsiflexion 4 5  Ankle plantarflexion    Ankle inversion    Ankle eversion    (Blank rows = not tested)  BED MOBILITY:  Patient reports no issues with bed mobility  TRANSFERS: Assistive device utilized: None  Sit to stand: Complete Independence Stand to sit: Complete Independence Chair to chair:  Not tested; no issue  reported Floor:  Not tested; no issue reported  RAMP:  Level of Assistance: Modified independence Assistive device utilized: Walker - 4  wheeled and None Ramp Comments:   CURB:  Level of Assistance: SBA Assistive device utilized: None Curb Comments: Patient mentions having to go slow and be intentional   GAIT: Gait pattern: WFL, step through pattern, and decreased stride length Distance walked: 500 feet in clinic Assistive device utilized: Walker - 4 wheeled Level of assistance: Modified independence Comments:   FUNCTIONAL TESTS:  5 times sit to stand: 12 second 2 minute walk test: 349 feet Single leg balance: Right: 4 sec; Left: 6 sec  PATIENT SURVEYS:  FOTO 59  TODAY'S TREATMENT:                                                                                                                              DATE:  11/18/23 Gait training in gym  Nustep seat 5 x 6' level 4 dynamic warm up Heel raises on incline x 20 no UE assist Toe raises on decline x 20 no UE assist Tandem stance on foam 2 x 30 each no UE assist Tandem stance holding tidal tank x 30 each Sidestepping with tidal tank 10 ft x 4 reps each Sit to stand with tidal tank 2 x 10; 2nd round with 3 sec eccentric sit BOSU ball standing (ball inverted, standing on flat surface) standing x 30 then squats 2 x 10 with no UE assist; CGA for safety  2 MWT 470 ft  SLS 15 each   11/14/23 2 laps around PT gym no AD; focus on improved heel strike and toe off Nustep seat 5 x 5' level 2  Heel raises on incline 2 x 10 Toe raises on decline 2 x 10 Sit to stand on foam with left foot advanced and holding yellow med ball 3 x 10 Alternating lunge on BOSU 2 x 10 no UE assist Leg press 4 plates both and 2 plates right only 2 x 10 each    11/11/23 2 laps around PT gym no AD Sit to stand no UE assist x 10 Sit to stand on foam x 10 no UE assist Sit to stand on foam with left foot advanced no UE assist x 10 Sit to stand on  foam with left foot advanced with yellow med ball x 10 Heel raises on incline 2 x 10 Toe raises on decline 2 x 10 Stepping over tall hurdles x 4 holding 3# weights 4 reps down and back Tandem walking 10 ft line down and back x 2 Warrior ll pose 2 x 5 each (flexing front knee) holding last rep x 10 sec Leg press bodycraft 4 plates 2 x 10 Single leg press 2 plates right leg x 10 End with Nustep seat 5 level 3 conditioning    11/06/23: no AD 452 ft Squat 10x with chair behind Heel raises 20x  Toe raises decline slope 20x Tandem stance with one foot on 6in step with yellow weight ball punch forward then overhead SLS Lt 37, Rt 32 Vector stance  3x 5 Lunge with no HHA 10x  Quadruped UE/LE 20x  Squat with yellow weight ball to heel raise 15x Stairs 5RT 7in step height carrying yellow weight ball no HR ascending, 1 HR descending  11/04/23 Nustep seat 5 x 5' dynamic warm up Gait training with SPC x 1 lap Gait training without AD x 1 lap Discussed and issued walking program Heel toe raises x 20 SLS x 30 each (intermittent UE assist) Tandem stance x 30 each no UE assist Sit to stand 2 x 10 no UE assist from low mat table 7 step navigation with 1 hand held assist/railing up and down x 3 Toe tap cones 2 sets of x 3 each no UE assist Step over cones x 3 each    10/21/2023: Initial Evaluation and initiated HEP  PATIENT EDUCATION: Education details: Patient educated on exam findings, POC, scope of PT, HEP, and PT prognosis. Person educated: Patient Education method: Explanation, Demonstration, and Handouts Education comprehension: verbalized understanding, returned demonstration, verbal cues required, and tactile cues required   HOME EXERCISE PROGRAM: 11/11/23 warrior 2 and heel/toe walking Access Code: [email or web address]  URL: [email or web address]  Date: 10/21/2023  Prepared by: AP  Rehab Exercises  Sit to Stand  - 2 x daily - 7 x weekly - 1 sets - 10 reps   Standing Tandem Balance with Counter Support  - 2 x daily - 7 x weekly - 1 sets - 3 reps - 30 sec hold  Standing March with Counter Support  - 2 x daily - 7 x weekly - 2 sets - 10 reps    10/28/23: - Side Stepping with Resistance at Thighs and Counter Support  - 2 x daily - 7 x weekly - 3 sets - 10 reps - Single Leg Stance  - 2 x daily - 7 x weekly - 1 sets - 5 reps - 5 hold    11/05/23: Vector stance  GOALS: Goals reviewed with patient? No  SHORT TERM GOALS: Target date: 11/11/2023  Patient will be independent with managing initial HEP to improve self management and self care.  Baseline: Goal status: met  2.  Patient will be able confident in standing and walking for >20 minutes without AD in order to tolerate grocery shopping. Baseline:  Goal status: met  LONG TERM GOALS: Target date: 12/02/2023  Patient will improve 2 MWT to 400 feet w/o AD in order to demonstrate improved functional ambulatory capacity in community setting and promote return to work. Baseline: 470 ft Goal status: met  2.  By discharge, patient will increase FOTO score by >10 points in order to improve functional capacity and tolerance to daily activity.  Baseline: 59 Goal status: in progress  3.  Patient will be able confident in standing and walking for >45 minutes without AD in order to tolerate demands of returning to work. Baseline:  Goal status: in progress  4.  Patient will demonstrate SLS > 15 sec bilaterally to improve balance and decrease risk of falls. Baseline: Right: 4 sec; Left: 6 sec; 11/18/23 15 sec Goal status: met  5.  Patient will improve R LE knee flexion and ankle dorsiflexion to 4+/5 to decrease risk of falls and improve functional strength.  Baseline:  Goal status:in progress  ASSESSMENT:  CLINICAL IMPRESSION:    Today's session started with gait training; patient with very mild limp only and states she feels this is her normal walk.  Patient met goals for SLS and 2 MWT.   Added tidal tank  and foam beam exercises; BOSU squats today for balance challenge;  patient with moderate challenge but no loss of balance with new exercises.   Patient will benefit from continued skilled therapy services to address deficits and promote return to optimal function.       Eval:  Patient is a 47 y.o. female who was seen today for physical therapy evaluation and treatment for a right midbrain infarction. 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 She's been able to navigate her home without the use of her RW but takes it any time she's out in the community. Functional tests (5x STS and ) reveal good physical ability for an acute stroke. Patient has little difficulty with any transfers. She still notices a right pulsion which is slightly observed during and while seated during the evaluation. Single leg stance is more difficult on the right leg. SLS on both sides resulted in a LOB to the right. LE MMTs also revealed slight right-sided weakness compared to the left. Throughout session, patient's speech was slow but fluent, not slurred. No difficulty with cognition, processing questions and commands.   OBJECTIVE IMPAIRMENTS: decreased balance, dizziness, and obesity.   ACTIVITY LIMITATIONS: caring for others  PARTICIPATION LIMITATIONS: cleaning, laundry, driving, shopping, and occupation  PERSONAL FACTORS: Transportation and 1-2 comorbidities: HTN, smoker  are also affecting patient's functional outcome.   REHAB POTENTIAL: Good  CLINICAL DECISION MAKING: Stable/uncomplicated  EVALUATION COMPLEXITY: Moderate  PLAN:  PT FREQUENCY: 1-2x/week  PT DURATION: 6 weeks  PLANNED INTERVENTIONS: 97164- PT Re-evaluation, 97110-Therapeutic exercises, 97530- Therapeutic activity, 97112- Neuromuscular re-education, 97535- Self Care, [id number]- Manual therapy, [phone]- Gait training, Balance training, and Stair training  PLAN FOR NEXT  SESSION: Functional strength and balance activities.  Squats, lunges, warrior poses, stair training (then carrying stuff up/down stairs) etc... add BOSU ball next RX lunge onto; squat; sees rehab MD 11/30/22   1:53 PM, 11/18/23 Hildreth Orsak Small Jencarlos Nicolson MPT [facility] [place] [phone] Ph:[phone]

## 2023-11-20 ENCOUNTER — Ambulatory Visit (HOSPITAL_COMMUNITY): Payer: BC Managed Care – PPO | Attending: Physician Assistant

## 2023-11-20 DIAGNOSIS — Z7409 Other reduced mobility: Secondary | ICD-10-CM | POA: Diagnosis not present

## 2023-11-20 DIAGNOSIS — R29818 Other symptoms and signs involving the nervous system: Secondary | ICD-10-CM | POA: Insufficient documentation

## 2023-11-20 DIAGNOSIS — R262 Difficulty in walking, not elsewhere classified: Secondary | ICD-10-CM | POA: Diagnosis not present

## 2023-11-20 DIAGNOSIS — I639 Cerebral infarction, unspecified: Secondary | ICD-10-CM | POA: Diagnosis not present

## 2023-11-20 DIAGNOSIS — R278 Other lack of coordination: Secondary | ICD-10-CM | POA: Diagnosis not present

## 2023-11-20 NOTE — Therapy (Signed)
 OUTPATIENT PHYSICAL THERAPY NEURO TREATMENT   Patient Name: Melissa Wilkerson MRN: [id number] DOB:1976-11-17, 48 y.o., female Today's Date: 11/20/2023   PCP: Celestia Rosaline SQUIBB, NP REFERRING PROVIDER: Pegge Toribio PARAS, PA-C  END OF SESSION:  PT End of Session - 11/20/23 1346     Visit Number 9    Number of Visits 12    Date for PT Re-Evaluation 12/02/23    Authorization Type [employer]    PT Start Time 1346    PT Stop Time 1426    PT Time Calculation (min) 40 min    Activity Tolerance Patient tolerated treatment well    Behavior During Therapy [facility] for tasks assessed/performed             Past Medical History:  Diagnosis Date   Abdominal bloating 05/26/2015   Breast nodule 03/31/2015   Dyspareunia 05/26/2015   Essential hypertension, benign 09/28/2019   HLD (hyperlipidemia) 09/28/2019   Hypothyroidism, adult 09/28/2019   Vitamin D  deficiency disease 09/28/2019   No past surgical history on file. Patient Active Problem List   Diagnosis Date Noted   CVA (cerebral vascular accident) (HCC) 10/06/2023   Controlled type 2 diabetes mellitus with hyperglycemia, without long-term current use of insulin  (HCC) 10/04/2023   Acute ischemic stroke (HCC) 10/02/2023   Acute CVA (cerebrovascular accident) (HCC) 10/01/2023   Pre-diabetes 10/01/2023   Encounter for screening fecal occult blood testing 12/01/2020   Encounter for gynecological examination with Papanicolaou smear of cervix 12/01/2020   Tobacco abuse 12/01/2020   Cigarette nicotine dependence without complication 09/20/2020   Essential hypertension, benign 09/28/2019   Hypothyroidism, adult 09/28/2019   Vitamin D  deficiency disease 09/28/2019   HLD (hyperlipidemia) 09/28/2019   Abdominal bloating 05/26/2015   Dyspareunia 05/26/2015   Breast nodule 03/31/2015    ONSET DATE: 10/01/2023  REFERRING DIAG: I63.9 (ICD-10-CM) - Cerebral infarction, unspecified  THERAPY DIAG:  Difficulty in walking, not  elsewhere classified  Cerebrovascular accident (CVA), unspecified mechanism (HCC)  Other symptoms and signs involving the nervous system  Other lack of coordination  Impaired functional mobility, balance, gait, and endurance  Rationale for Evaluation and Treatment: Rehabilitation  SUBJECTIVE:                                                                                                                                                                                             SUBJECTIVE STATEMENT:   Able to do a 20 minute walk today.   Vision still a little blurry In the mornings but resolves quickly and does not have any issues with vision throughout the rest of the day.  Eval:Patient presents to the clinic post CVA on 10/01/23. She states standing up from her recliner and fell right and caught herself on the coffee table. Noticed right side weakness in UE, face and could tell something was off. Went to ED; discharged from CIR on 10/09/23. To date, patient has noticed balance problems when walking and turning. Not so much when standing. Prior to CVA she was completely independent will all ADLs and worked as a brewing technologist; currently trying to get on disability. Notices leaning to the right and listing that way when walking; has left sided numbness and doesn't feel pain on that side in UE or LE. Can feel touch but not the pain from pinch. Sometimes her left arm is goes cold. Patient is doing well with most ADLs and has her husband and son help when needed; currently not driving. Patient is seeing family med NP this afternoon for the first session.  Pt accompanied by: family member; son in lobby; didn't come back for evaluation.   PERTINENT HISTORY: Smoker, HTN  PAIN:  Are you having pain? No; has some LBP and neck pain but doesn't attribute that to the CVA.  PRECAUTIONS: None  RED FLAGS: None   WEIGHT BEARING RESTRICTIONS: No  FALLS: Has patient fallen in last 6 months?  No  LIVING ENVIRONMENT: Lives with: lives with their family, lives with their spouse, and lives with their son Lives in: House/apartment Stairs: Yes: Internal: 1 steps; none and External: 1 steps; none Has following equipment at home: Walker - 4 wheeled  PLOF: Independent  PATIENT GOALS: Get back to work. Cleaning around the house.   OBJECTIVE:  Note: Objective measures were completed at Evaluation unless otherwise noted.  DIAGNOSTIC FINDINGS:  10/01/23 MRI FINDINGS: Brain: Acute infarct in the right midbrain. Slight edema without mass effect. Age advanced T2/FLAIR hyperintensities in the white matter. No acute hemorrhage, mass lesion, midline shift or hydrocephalus. Vascular: Major arterial flow voids are maintained skull base. Skull and upper cervical spine: Normal marrow signal. Sinuses/Orbits: Negative.   COGNITION: Overall cognitive status: Within functional limits for tasks assessed   SENSATION: WFL  COORDINATION: Appears WFL for UE and LE   MUSCLE TONE: LLE: Within functional limits   POSTURE: rounded shoulders and forward head    UE and LE AROM grossly WNL throughout UE MMTs grossly WNL throughout  LOWER EXTREMITY ROM:     Active  Right Eval Left Eval  Hip flexion    Hip extension    Hip abduction    Hip adduction    Hip internal rotation    Hip external rotation    Knee flexion    Knee extension    Ankle dorsiflexion    Ankle plantarflexion    Ankle inversion    Ankle eversion     (Blank rows = not tested)  LOWER EXTREMITY MMT:    MMT Right Eval Left Eval  Hip flexion 4+ 4+  Hip extension    Hip abduction    Hip adduction    Hip internal rotation    Hip external rotation    Knee flexion (sitting) 4 4  Knee extension 5 5  Ankle dorsiflexion 4 5  Ankle plantarflexion    Ankle inversion    Ankle eversion    (Blank rows = not tested)  BED MOBILITY:  Patient reports no issues with bed mobility  TRANSFERS: Assistive device utilized:  None  Sit to stand: Complete Independence Stand to sit: Complete Independence Chair to chair:  Not  tested; no issue reported Floor:  Not tested; no issue reported  RAMP:  Level of Assistance: Modified independence Assistive device utilized: Walker - 4 wheeled and None Ramp Comments:   CURB:  Level of Assistance: SBA Assistive device utilized: None Curb Comments: Patient mentions having to go slow and be intentional   GAIT: Gait pattern: WFL, step through pattern, and decreased stride length Distance walked: 500 feet in clinic Assistive device utilized: Walker - 4 wheeled Level of assistance: Modified independence Comments:   FUNCTIONAL TESTS:  5 times sit to stand: 12 second 2 minute walk test: 349 feet Single leg balance: Right: 4 sec; Left: 6 sec  PATIENT SURVEYS:  FOTO 59  TODAY'S TREATMENT:                                                                                                                              DATE:  11/20/2023 Elliptical x 2' dynamic warm up Nustep level 5 x 3' dynamic warm up Cybex hamstring curls 4 plates 2 x 10 Bodycraft leg press 4 plates 2 x 10 both, 3 plates right leg only x 15 reps Heel raise on step x 20 8 step ups with Heisman opposite knee 2 x 10 each no UE assist Foam balance beam walking down and back x 2; heel/toe walking on foam balance beam x 2; CGA for safety Tall hurdles x 4 step overs with tidal tank down and back x 4 reps; CGA for safety   11/18/23 Gait training in gym  Nustep seat 5 x 6' level 4 dynamic warm up Heel raises on incline x 20 no UE assist Toe raises on decline x 20 no UE assist Tandem stance on foam 2 x 30 each no UE assist Tandem stance holding tidal tank x 30 each Sidestepping with tidal tank 10 ft x 4 reps each Sit to stand with tidal tank 2 x 10; 2nd round with 3 sec eccentric sit BOSU ball standing (ball inverted, standing on flat surface) standing x 30 then squats 2 x 10 with no UE assist; CGA for  safety  2 MWT 470 ft  SLS 15 each   11/14/23 2 laps around PT gym no AD; focus on improved heel strike and toe off Nustep seat 5 x 5' level 2  Heel raises on incline 2 x 10 Toe raises on decline 2 x 10 Sit to stand on foam with left foot advanced and holding yellow med ball 3 x 10 Alternating lunge on BOSU 2 x 10 no UE assist Leg press 4 plates both and 2 plates right only 2 x 10 each    11/11/23 2 laps around PT gym no AD Sit to stand no UE assist x 10 Sit to stand on foam x 10 no UE assist Sit to stand on foam with left foot advanced no UE assist x 10 Sit to stand on foam with left foot advanced with yellow med ball x 10  Heel raises on incline 2 x 10 Toe raises on decline 2 x 10 Stepping over tall hurdles x 4 holding 3# weights 4 reps down and back Tandem walking 10 ft line down and back x 2 Warrior ll pose 2 x 5 each (flexing front knee) holding last rep x 10 sec Leg press bodycraft 4 plates 2 x 10 Single leg press 2 plates right leg x 10 End with Nustep seat 5 level 3 conditioning    11/06/23: no AD 452 ft Squat 10x with chair behind Heel raises 20x  Toe raises decline slope 20x Tandem stance with one foot on 6in step with yellow weight ball punch forward then overhead SLS Lt 37, Rt 32 Vector stance 3x 5 Lunge with no HHA 10x  Quadruped UE/LE 20x  Squat with yellow weight ball to heel raise 15x Stairs 5RT 7in step height carrying yellow weight ball no HR ascending, 1 HR descending  11/04/23 Nustep seat 5 x 5' dynamic warm up Gait training with SPC x 1 lap Gait training without AD x 1 lap Discussed and issued walking program Heel toe raises x 20 SLS x 30 each (intermittent UE assist) Tandem stance x 30 each no UE assist Sit to stand 2 x 10 no UE assist from low mat table 7 step navigation with 1 hand held assist/railing up and down x 3 Toe tap cones 2 sets of x 3 each no UE assist Step over cones x 3 each    10/21/2023: Initial Evaluation  and initiated HEP  PATIENT EDUCATION: Education details: Patient educated on exam findings, POC, scope of PT, HEP, and PT prognosis. Person educated: Patient Education method: Explanation, Demonstration, and Handouts Education comprehension: verbalized understanding, returned demonstration, verbal cues required, and tactile cues required   HOME EXERCISE PROGRAM: 11/11/23 warrior 2 and heel/toe walking Access Code: [email or web address]  URL: [email or web address]  Date: 10/21/2023  Prepared by: AP  Rehab Exercises  Sit to Stand  - 2 x daily - 7 x weekly - 1 sets - 10 reps  Standing Tandem Balance with Counter Support  - 2 x daily - 7 x weekly - 1 sets - 3 reps - 30 sec hold  Standing March with Counter Support  - 2 x daily - 7 x weekly - 2 sets - 10 reps    10/28/23: - Side Stepping with Resistance at Thighs and Counter Support  - 2 x daily - 7 x weekly - 3 sets - 10 reps - Single Leg Stance  - 2 x daily - 7 x weekly - 1 sets - 5 reps - 5 hold    11/05/23: Vector stance  GOALS: Goals reviewed with patient? No  SHORT TERM GOALS: Target date: 11/11/2023  Patient will be independent with managing initial HEP to improve self management and self care.  Baseline: Goal status: met  2.  Patient will be able confident in standing and walking for >20 minutes without AD in order to tolerate grocery shopping. Baseline:  Goal status: met  LONG TERM GOALS: Target date: 12/02/2023  Patient will improve 2 MWT to 400 feet w/o AD in order to demonstrate improved functional ambulatory capacity in community setting and promote return to work. Baseline: 470 ft Goal status: met  2.  By discharge, patient will increase FOTO score by >10 points in order to improve functional capacity and tolerance to daily activity.  Baseline: 59 Goal status: in progress  3.  Patient will be able confident in standing and walking  for >45 minutes without AD in order to tolerate demands of returning to  work. Baseline:  Goal status: in progress  4.  Patient will demonstrate SLS > 15 sec bilaterally to improve balance and decrease risk of falls. Baseline: Right: 4 sec; Left: 6 sec; 11/18/23 15 sec Goal status: met  5.  Patient will improve R LE knee flexion and ankle dorsiflexion to 4+/5 to decrease risk of falls and improve functional strength.  Baseline:  Goal status:in progress  ASSESSMENT:  CLINICAL IMPRESSION:    Patient able to warm up a couple minutes on the elliptical today without issue. Continued with strengthening and balance training.  Progressed strengthening with good response.  Moderate balance challenge with heisman step ups today. Increased challenge with foam balance beam walking and tidal tank hurdle navigation; has one loss of balance with hurdles and 2 with foam beam tandem walking but able to recover without a fall.    Patient will benefit from continued skilled therapy services to address deficits and promote return to optimal function.       Eval:  Patient is a 49 y.o. female who was seen today for physical therapy evaluation and treatment for a right midbrain infarction. 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 She's been able to navigate her home without the use of her RW but takes it any time she's out in the community. Functional tests (5x STS and ) reveal good physical ability for an acute stroke. Patient has little difficulty with any transfers. She still notices a right pulsion which is slightly observed during and while seated during the evaluation. Single leg stance is more difficult on the right leg. SLS on both sides resulted in a LOB to the right. LE MMTs also revealed slight right-sided weakness compared to the left. Throughout session, patient's speech was slow but fluent, not slurred. No difficulty with cognition, processing questions and commands.   OBJECTIVE IMPAIRMENTS: decreased balance,  dizziness, and obesity.   ACTIVITY LIMITATIONS: caring for others  PARTICIPATION LIMITATIONS: cleaning, laundry, driving, shopping, and occupation  PERSONAL FACTORS: Transportation and 1-2 comorbidities: HTN, smoker  are also affecting patient's functional outcome.   REHAB POTENTIAL: Good  CLINICAL DECISION MAKING: Stable/uncomplicated  EVALUATION COMPLEXITY: Moderate  PLAN:  PT FREQUENCY: 1-2x/week  PT DURATION: 6 weeks  PLANNED INTERVENTIONS: 97164- PT Re-evaluation, 97110-Therapeutic exercises, 97530- Therapeutic activity, 97112- Neuromuscular re-education, 97535- Self Care, [id number]- Manual therapy, [phone]- Gait training, Balance training, and Stair training  PLAN FOR NEXT SESSION: Functional strength and balance activities.  sees rehab MD 11/30/22   2:40 PM, 11/20/23 Regan Mcbryar Small Yuktha Kerchner MPT [facility] [place] [phone]

## 2023-11-21 ENCOUNTER — Ambulatory Visit: Payer: BC Managed Care – PPO

## 2023-11-25 ENCOUNTER — Other Ambulatory Visit (HOSPITAL_COMMUNITY)
Admission: RE | Admit: 2023-11-25 | Discharge: 2023-11-25 | Disposition: A | Discharge status: Home / Self Care | Payer: BC Managed Care – PPO | Source: Ambulatory Visit | Attending: Adult Health | Admitting: Adult Health

## 2023-11-25 ENCOUNTER — Ambulatory Visit (HOSPITAL_COMMUNITY): Payer: BC Managed Care – PPO

## 2023-11-25 ENCOUNTER — Ambulatory Visit (INDEPENDENT_AMBULATORY_CARE_PROVIDER_SITE_OTHER): Payer: BC Managed Care – PPO | Admitting: Adult Health

## 2023-11-25 ENCOUNTER — Encounter: Payer: Self-pay | Admitting: Adult Health

## 2023-11-25 VITALS — BP 136/73 | HR 82 | Ht 62.0 in | Wt 161.0 lb

## 2023-11-25 DIAGNOSIS — R278 Other lack of coordination: Secondary | ICD-10-CM

## 2023-11-25 DIAGNOSIS — Z1211 Encounter for screening for malignant neoplasm of colon: Secondary | ICD-10-CM

## 2023-11-25 DIAGNOSIS — Z01419 Encounter for gynecological examination (general) (routine) without abnormal findings: Secondary | ICD-10-CM | POA: Diagnosis not present

## 2023-11-25 DIAGNOSIS — I639 Cerebral infarction, unspecified: Secondary | ICD-10-CM

## 2023-11-25 DIAGNOSIS — R29818 Other symptoms and signs involving the nervous system: Secondary | ICD-10-CM

## 2023-11-25 DIAGNOSIS — Z7409 Other reduced mobility: Secondary | ICD-10-CM | POA: Diagnosis not present

## 2023-11-25 DIAGNOSIS — Z1331 Encounter for screening for depression: Secondary | ICD-10-CM

## 2023-11-25 DIAGNOSIS — R262 Difficulty in walking, not elsewhere classified: Secondary | ICD-10-CM

## 2023-11-25 LAB — HEMOCCULT GUIAC POC 1CARD (OFFICE): Fecal Occult Blood, POC: NEGATIVE

## 2023-11-25 NOTE — Therapy (Signed)
 OUTPATIENT PHYSICAL THERAPY NEURO TREATMENT/ PROGRESS NOTE Progress Note Reporting Period 10/21/2023 to 11/25/2023  See note below for Objective Data and Assessment of Progress/Goals.        Patient Name: Melissa Wilkerson MRN: [id number] DOB:[birth year], 48 y.o., female Today's Date: 11/25/2023   PCP: Celestia Rosaline SQUIBB, NP REFERRING PROVIDER: Pegge Toribio PARAS, PA-C  END OF SESSION:  PT End of Session - 11/25/23 1300     Visit Number 10    Number of Visits 12    Date for PT Re-Evaluation 12/02/23    Authorization Type [employer]    PT Start Time 1300    PT Stop Time 1340    PT Time Calculation (min) 40 min    Activity Tolerance Patient tolerated treatment well    Behavior During Therapy [facility] for tasks assessed/performed             Past Medical History:  Diagnosis Date   Abdominal bloating 05/26/2015   Breast nodule 03/31/2015   Diabetes mellitus without complication (HCC)    Dyspareunia 05/26/2015   Essential hypertension, benign 09/28/2019   HLD (hyperlipidemia) 09/28/2019   Hypothyroidism, adult 09/28/2019   Vitamin D  deficiency disease 09/28/2019   No past surgical history on file. Patient Active Problem List   Diagnosis Date Noted   CVA (cerebral vascular accident) (HCC) 10/06/2023   Controlled type 2 diabetes mellitus with hyperglycemia, without long-term current use of insulin  (HCC) 10/04/2023   Acute ischemic stroke (HCC) 10/02/2023   Acute CVA (cerebrovascular accident) (HCC) 10/01/2023   Pre-diabetes 10/01/2023   Encounter for screening fecal occult blood testing 12/01/2020   Encounter for gynecological examination with Papanicolaou smear of cervix 12/01/2020   Tobacco abuse 12/01/2020   Cigarette nicotine dependence without complication 09/20/2020   Essential hypertension, benign 09/28/2019   Hypothyroidism, adult 09/28/2019   Vitamin D  deficiency disease 09/28/2019   HLD (hyperlipidemia) 09/28/2019   Abdominal bloating 05/26/2015    Dyspareunia 05/26/2015   Breast nodule 03/31/2015    ONSET DATE: 10/01/2023  REFERRING DIAG: I63.9 (ICD-10-CM) - Cerebral infarction, unspecified  THERAPY DIAG:  Difficulty in walking, not elsewhere classified  Cerebrovascular accident (CVA), unspecified mechanism (HCC)  Other symptoms and signs involving the nervous system  Other lack of coordination  Impaired functional mobility, balance, gait, and endurance  Rationale for Evaluation and Treatment: Rehabilitation  SUBJECTIVE:                                                                                                                                                                                             SUBJECTIVE STATEMENT:   At least 90% better; still  at 20 minutes walking daily   Eval:Patient presents to the clinic post CVA on 10/01/23. She states standing up from her recliner and fell right and caught herself on the coffee table. Noticed right side weakness in UE, face and could tell something was off. Went to ED; discharged from CIR on 10/09/23. To date, patient has noticed balance problems when walking and turning. Not so much when standing. Prior to CVA she was completely independent will all ADLs and worked as a brewing technologist; currently trying to get on disability. Notices leaning to the right and listing that way when walking; has left sided numbness and doesn't feel pain on that side in UE or LE. Can feel touch but not the pain from pinch. Sometimes her left arm is goes cold. Patient is doing well with most ADLs and has her husband and son help when needed; currently not driving. Patient is seeing family med NP this afternoon for the first session.  Pt accompanied by: family member; son in lobby; didn't come back for evaluation.   PERTINENT HISTORY: Smoker, HTN  PAIN:  Are you having pain? No; has some LBP and neck pain but doesn't attribute that to the CVA.  PRECAUTIONS: None  RED FLAGS: None   WEIGHT  BEARING RESTRICTIONS: No  FALLS: Has patient fallen in last 6 months? No  LIVING ENVIRONMENT: Lives with: lives with their family, lives with their spouse, and lives with their son Lives in: House/apartment Stairs: Yes: Internal: 1 steps; none and External: 1 steps; none Has following equipment at home: Walker - 4 wheeled  PLOF: Independent  PATIENT GOALS: Get back to work. Cleaning around the house.   OBJECTIVE:  Note: Objective measures were completed at Evaluation unless otherwise noted.  DIAGNOSTIC FINDINGS:  10/01/23 MRI FINDINGS: Brain: Acute infarct in the right midbrain. Slight edema without mass effect. Age advanced T2/FLAIR hyperintensities in the white matter. No acute hemorrhage, mass lesion, midline shift or hydrocephalus. Vascular: Major arterial flow voids are maintained skull base. Skull and upper cervical spine: Normal marrow signal. Sinuses/Orbits: Negative.   COGNITION: Overall cognitive status: Within functional limits for tasks assessed   SENSATION: WFL  COORDINATION: Appears WFL for UE and LE   MUSCLE TONE: LLE: Within functional limits   POSTURE: rounded shoulders and forward head    UE and LE AROM grossly WNL throughout UE MMTs grossly WNL throughout  LOWER EXTREMITY ROM:     Active  Right Eval Left Eval  Hip flexion    Hip extension    Hip abduction    Hip adduction    Hip internal rotation    Hip external rotation    Knee flexion    Knee extension    Ankle dorsiflexion    Ankle plantarflexion    Ankle inversion    Ankle eversion     (Blank rows = not tested)  LOWER EXTREMITY MMT:    MMT Right Eval Left Eval Right 11/24/22  Hip flexion 4+ 4+   Hip extension     Hip abduction     Hip adduction     Hip internal rotation     Hip external rotation     Knee flexion (sitting) 4 4 4+  Knee extension 5 5   Ankle dorsiflexion 4 5 4+  Ankle plantarflexion     Ankle inversion     Ankle eversion     (Blank rows = not  tested)  BED MOBILITY:  Patient reports no issues with bed mobility  TRANSFERS: Assistive device utilized: None  Sit to stand: Complete Independence Stand to sit: Complete Independence Chair to chair:  Not tested; no issue reported Floor:  Not tested; no issue reported  RAMP:  Level of Assistance: Modified independence Assistive device utilized: Walker - 4 wheeled and None Ramp Comments:   CURB:  Level of Assistance: SBA Assistive device utilized: None Curb Comments: Patient mentions having to go slow and be intentional   GAIT: Gait pattern: WFL, step through pattern, and decreased stride length Distance walked: 500 feet in clinic Assistive device utilized: Walker - 4 wheeled Level of assistance: Modified independence Comments:   FUNCTIONAL TESTS:  5 times sit to stand: 12 second 2 minute walk test: 349 feet Single leg balance: Right: 4 sec; Left: 6 sec  PATIENT SURVEYS:  FOTO 59  TODAY'S TREATMENT:                                                                                                                              DATE:  11/25/2023 Elliptical x 3' dynamic warm up Nustep level 5 x 3' dynamic warm up Mini progress note  FOTO 82 5 time sit to stand 10.08 sec SLS 15  each Wall slides against door 2 x 10 Standing against door dorsiflexion bilaterally 2 x 10 Physioball red sitting march, knee extension and toe raises x 8 each with CGA for safety Hamstring curls Cybex 4 plates both concentric and right only eccentric    11/20/2023 Elliptical x 2' dynamic warm up Nustep level 5 x 3' dynamic warm up Cybex hamstring curls 4 plates 2 x 10 Bodycraft leg press 4 plates 2 x 10 both, 3 plates right leg only x 15 reps Heel raise on step x 20 8 step ups with Heisman opposite knee 2 x 10 each no UE assist Foam balance beam walking down and back x 2; heel/toe walking on foam balance beam x 2; CGA for safety Tall hurdles x 4 step overs with tidal tank down and back x 4  reps; CGA for safety   11/18/23 Gait training in gym  Nustep seat 5 x 6' level 4 dynamic warm up Heel raises on incline x 20 no UE assist Toe raises on decline x 20 no UE assist Tandem stance on foam 2 x 30 each no UE assist Tandem stance holding tidal tank x 30 each Sidestepping with tidal tank 10 ft x 4 reps each Sit to stand with tidal tank 2 x 10; 2nd round with 3 sec eccentric sit BOSU ball standing (ball inverted, standing on flat surface) standing x 30 then squats 2 x 10 with no UE assist; CGA for safety  2 MWT 470 ft  SLS 15 each   11/14/23 2 laps around PT gym no AD; focus on improved heel strike and toe off Nustep seat 5 x 5' level 2  Heel raises on incline 2 x 10 Toe raises on decline 2 x 10 Sit  to stand on foam with left foot advanced and holding yellow med ball 3 x 10 Alternating lunge on BOSU 2 x 10 no UE assist Leg press 4 plates both and 2 plates right only 2 x 10 each     10/21/2023: Initial Evaluation and initiated HEP  PATIENT EDUCATION: Education details: Patient educated on exam findings, POC, scope of PT, HEP, and PT prognosis. Person educated: Patient Education method: Explanation, Demonstration, and Handouts Education comprehension: verbalized understanding, returned demonstration, verbal cues required, and tactile cues required   HOME EXERCISE PROGRAM: 11/11/23 warrior 2 and heel/toe walking Access Code: [email or web address]  URL: [email or web address]  Date: 10/21/2023  Prepared by: AP  Rehab Exercises  Sit to Stand  - 2 x daily - 7 x weekly - 1 sets - 10 reps  Standing Tandem Balance with Counter Support  - 2 x daily - 7 x weekly - 1 sets - 3 reps - 30 sec hold  Standing March with Counter Support  - 2 x daily - 7 x weekly - 2 sets - 10 reps    10/28/23: - Side Stepping with Resistance at Thighs and Counter Support  - 2 x daily - 7 x weekly - 3 sets - 10 reps - Single Leg Stance  - 2 x daily - 7 x weekly - 1 sets - 5 reps - 5 hold     11/05/23: Vector stance  GOALS: Goals reviewed with patient? No  SHORT TERM GOALS: Target date: 11/11/2023  Patient will be independent with managing initial HEP to improve self management and self care.  Baseline: Goal status: met  2.  Patient will be able confident in standing and walking for >20 minutes without AD in order to tolerate grocery shopping. Baseline:  Goal status: met  LONG TERM GOALS: Target date: 12/02/2023  Patient will improve 2 MWT to 400 feet w/o AD in order to demonstrate improved functional ambulatory capacity in community setting and promote return to work. Baseline: 470 ft Goal status: met  2.  By discharge, patient will increase FOTO score by >10 points in order to improve functional capacity and tolerance to daily activity.  Baseline: 59; 11/25/23 82 Goal status: met  3.  Patient will be able confident in standing and walking for >45 minutes without AD in order to tolerate demands of returning to work. Baseline:  Goal status: in progress  4.  Patient will demonstrate SLS > 15 sec bilaterally to improve balance and decrease risk of falls. Baseline: Right: 4 sec; Left: 6 sec; 11/18/23 15 sec; 11/25/23 15 each Goal status: met  5.  Patient will improve R LE knee flexion and ankle dorsiflexion to 4+/5 to decrease risk of falls and improve functional strength.  Baseline:  Goal status:in progress  ASSESSMENT:  CLINICAL IMPRESSION:   Mini progress note today; met goal for her balance and FOTO today. Added wall slides and standing dorsiflexion today;  Updated HEP.  Added physioball exercise to challenge balance today. Very good improvement overall.  Patient will benefit from continued skilled therapy services to address deficits and promote return to optimal function.       Eval:  Patient is a 48 y.o. female who was seen today for physical therapy evaluation and treatment for a right midbrain infarction. 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 She's been able to navigate her home without the use of her RW but takes it any time she's  out in the community. Functional tests (5x STS and ) reveal good physical ability for an acute stroke. Patient has little difficulty with any transfers. She still notices a right pulsion which is slightly observed during and while seated during the evaluation. Single leg stance is more difficult on the right leg. SLS on both sides resulted in a LOB to the right. LE MMTs also revealed slight right-sided weakness compared to the left. Throughout session, patient's speech was slow but fluent, not slurred. No difficulty with cognition, processing questions and commands.   OBJECTIVE IMPAIRMENTS: decreased balance, dizziness, and obesity.   ACTIVITY LIMITATIONS: caring for others  PARTICIPATION LIMITATIONS: cleaning, laundry, driving, shopping, and occupation  PERSONAL FACTORS: Transportation and 1-2 comorbidities: HTN, smoker  are also affecting patient's functional outcome.   REHAB POTENTIAL: Good  CLINICAL DECISION MAKING: Stable/uncomplicated  EVALUATION COMPLEXITY: Moderate  PLAN:  PT FREQUENCY: 1-2x/week  PT DURATION: 6 weeks  PLANNED INTERVENTIONS: 97164- PT Re-evaluation, 97110-Therapeutic exercises, 97530- Therapeutic activity, 97112- Neuromuscular re-education, 97535- Self Care, 02859- Manual therapy, [phone]- Gait training, Balance training, and Stair training  PLAN FOR NEXT SESSION: Functional strength and balance activities.  sees rehab MD 11/30/22   1:40 PM, 11/25/23 Leiliana Foody Small Nylene Inlow MPT [facility] [place] [phone]

## 2023-11-25 NOTE — Progress Notes (Signed)
 Patient ID: Melissa Wilkerson, female   DOB: [birth year], 48 y.o.   MRN: [id number] History of Present Illness:  Melissa Wilkerson is a 48 year old white female, married [id number] in for a well woman gyn exam and pap.  PCP is Rosaline Bohr NP   Current Medications, Allergies, Past Medical History, Past Surgical History, Family History and Social History were reviewed in [employer] record.     Review of Systems: Patient denies any headaches, hearing loss, fatigue, blurred vision, shortness of breath, chest pain, abdominal pain, problems with bowel movements, urination, or intercourse. No joint pain or mood swings.     Physical Exam:BP 136/73 (BP Location: Right Arm, Patient Position: Sitting, Cuff Size: Normal)   Pulse 82   Ht 5' 2 (1.575 m)   Wt 161 lb (73 kg)   LMP 11/10/2023 (Approximate)   BMI 29.45 kg/m   General:  Well developed, well nourished, no acute distress Skin:  Warm and dry Neck:  Midline trachea, normal thyroid , good ROM, no lymphadenopathy Lungs; Clear to auscultation bilaterally Breast:  No dominant palpable mass, retraction, or nipple discharge Cardiovascular: Regular rate and rhythm Abdomen:  Soft, non tender, no hepatosplenomegaly Pelvic:  External genitalia is normal in appearance, has multiple sebaceous cysts bilateral labia.  The vagina is normal in appearance. Urethra has no lesions or masses. The cervix is bulbous, pap with HR HPV genotyping prerformed.  Uterus is felt to be normal size, shape, and contour.  No adnexal masses or tenderness noted.Bladder is non tender, no masses felt. Rectal: Good sphincter tone, no polyps, or hemorrhoids felt.  Hemoccult negative. Extremities/musculoskeletal:  No swelling or varicosities noted, no clubbing or cyanosis Psych:  No mood changes, alert and cooperative,seems happy AA is - Fall risk is low    11/25/2023   11:31 AM 10/21/2023    2:51 PM 10/20/2023    1:53 PM  Depression screen PHQ 2/9  Decreased  Interest 0 0 0  Down, Depressed, Hopeless 0 0 0  PHQ - 2 Score 0 0 0  Altered sleeping 0  0  Tired, decreased energy 0  0  Change in appetite 0  0  Feeling bad or failure about yourself  0  0  Trouble concentrating 0  0  Moving slowly or fidgety/restless 0  0  Suicidal thoughts 0  0  PHQ-9 Score 0  0       11/25/2023   11:31 AM 10/21/2023    2:51 PM 12/01/2020   11:05 AM  GAD 7 : Generalized Anxiety Score  Nervous, Anxious, on Edge 0 0 0  Control/stop worrying 0 0 0  Worry too much - different things 0  0  Trouble relaxing 0  0  Restless 0  0  Easily annoyed or irritable 0  0  Afraid - awful might happen 0  0  Total GAD 7 Score 0  0      Upstream - 11/25/23 1136       Pregnancy Intention Screening   Does the patient want to become pregnant in the next year? No    Does the patient's partner want to become pregnant in the next year? No    Would the patient like to discuss contraceptive options today? No      Contraception Wrap Up   Current Method Withdrawal or Other Method    End Method Withdrawal or Other Method    Contraception Counseling Provided No  Examination chaperoned by Clarita Salt LPN   Impression and plan: 1. Encounter for gynecological examination with Papanicolaou smear of cervix (Primary) Pap sent Pap in 3 years Get physical with PCP Labs with PCP Colonoscopy advised Mammogram was normal 12.12.24  - Cytology - PAP( [facility])  2. Encounter for screening fecal occult blood testing Hemoccult was negative  - POCT occult blood stool

## 2023-11-26 ENCOUNTER — Telehealth: Payer: Self-pay | Admitting: Neurology

## 2023-11-26 NOTE — Telephone Encounter (Signed)
 It seems that she is still doing Physical therapy, once discharged from PT, will likely release her to go back to work.

## 2023-11-26 NOTE — Telephone Encounter (Signed)
 Pt called stating that she is needing a return to work note for her employer and would like to know what she needs to do to get that letter. Please advise.

## 2023-11-27 ENCOUNTER — Ambulatory Visit (HOSPITAL_COMMUNITY): Payer: BC Managed Care – PPO

## 2023-11-27 DIAGNOSIS — Z7409 Other reduced mobility: Secondary | ICD-10-CM

## 2023-11-27 DIAGNOSIS — I639 Cerebral infarction, unspecified: Secondary | ICD-10-CM

## 2023-11-27 DIAGNOSIS — R278 Other lack of coordination: Secondary | ICD-10-CM | POA: Diagnosis not present

## 2023-11-27 DIAGNOSIS — R262 Difficulty in walking, not elsewhere classified: Secondary | ICD-10-CM

## 2023-11-27 DIAGNOSIS — R29818 Other symptoms and signs involving the nervous system: Secondary | ICD-10-CM | POA: Diagnosis not present

## 2023-11-27 LAB — CYTOLOGY - PAP
Comment: NEGATIVE
Diagnosis: NEGATIVE
High risk HPV: NEGATIVE

## 2023-11-27 NOTE — Telephone Encounter (Signed)
 Let's change her return to work for Feb 3. We need to update the FMLA paper

## 2023-11-27 NOTE — Therapy (Signed)
 OUTPATIENT PHYSICAL THERAPY NEURO TREATMENT/DISCHARGE NOTE Reporting Period 10/21/2023 to 11/27/2023  PHYSICAL THERAPY DISCHARGE SUMMARY  Visits from Start of Care: 11  Current functional level related to goals / functional outcomes: See below   Remaining deficits: See below   Education / Equipment: HEP   Patient agrees to discharge. Patient goals were met. Patient is being discharged due to meeting the stated rehab goals.       Patient Name: Melissa Wilkerson MRN: [id number] DOB:October 22, 1976, 48 y.o., female Today's Date: 11/27/2023   PCP: Celestia Rosaline SQUIBB, NP REFERRING PROVIDER: Pegge Toribio PARAS, PA-C  END OF SESSION:  PT End of Session - 11/27/23 1308     Visit Number 11    Number of Visits 12    Date for PT Re-Evaluation 12/02/23    Authorization Type [employer]    PT Start Time [phone]    PT Stop Time 1345    PT Time Calculation (min) 40 min    Activity Tolerance Patient tolerated treatment well    Behavior During [facility] for tasks assessed/performed             Past Medical History:  Diagnosis Date   Abdominal bloating 05/26/2015   Breast nodule 03/31/2015   Diabetes mellitus without complication (HCC)    Dyspareunia 05/26/2015   Essential hypertension, benign 09/28/2019   HLD (hyperlipidemia) 09/28/2019   Hypothyroidism, adult 09/28/2019   Vitamin D  deficiency disease 09/28/2019   No past surgical history on file. Patient Active Problem List   Diagnosis Date Noted   CVA (cerebral vascular accident) (HCC) 10/06/2023   Controlled type 2 diabetes mellitus with hyperglycemia, without long-term current use of insulin  (HCC) 10/04/2023   Acute ischemic stroke (HCC) 10/02/2023   Acute CVA (cerebrovascular accident) (HCC) 10/01/2023   Pre-diabetes 10/01/2023   Encounter for screening fecal occult blood testing 12/01/2020   Encounter for gynecological examination with Papanicolaou smear of cervix 12/01/2020   Tobacco abuse 12/01/2020    Cigarette nicotine dependence without complication 09/20/2020   Essential hypertension, benign 09/28/2019   Hypothyroidism, adult 09/28/2019   Vitamin D  deficiency disease 09/28/2019   HLD (hyperlipidemia) 09/28/2019   Abdominal bloating 05/26/2015   Dyspareunia 05/26/2015   Breast nodule 03/31/2015    ONSET DATE: 10/01/2023  REFERRING DIAG: I63.9 (ICD-10-CM) - Cerebral infarction, unspecified  THERAPY DIAG:  Difficulty in walking, not elsewhere classified  Cerebrovascular accident (CVA), unspecified mechanism (HCC)  Other symptoms and signs involving the nervous system  Impaired functional mobility, balance, gait, and endurance  Other lack of coordination  Rationale for Evaluation and Treatment: Rehabilitation  SUBJECTIVE:  SUBJECTIVE STATEMENT:   At least 90% better; walking 30 minutes 2 x a day; feels she is ready to return to work   Eval:Patient presents to the clinic post CVA on 10/01/23. She states standing up from her recliner and fell right and caught herself on the coffee table. Noticed right side weakness in UE, face and could tell something was off. Went to ED; discharged from CIR on 10/09/23. To date, patient has noticed balance problems when walking and turning. Not so much when standing. Prior to CVA she was completely independent will all ADLs and worked as a brewing technologist; currently trying to get on disability. Notices leaning to the right and listing that way when walking; has left sided numbness and doesn't feel pain on that side in UE or LE. Can feel touch but not the pain from pinch. Sometimes her left arm is goes cold. Patient is doing well with most ADLs and has her husband and son help when needed; currently not driving. Patient is seeing family med NP this afternoon for the  first session.  Pt accompanied by: family member; son in lobby; didn't come back for evaluation.   PERTINENT HISTORY: Smoker, HTN  PAIN:  Are you having pain? No; has some LBP and neck pain but doesn't attribute that to the CVA.  PRECAUTIONS: None  RED FLAGS: None   WEIGHT BEARING RESTRICTIONS: No  FALLS: Has patient fallen in last 6 months? No  LIVING ENVIRONMENT: Lives with: lives with their family, lives with their spouse, and lives with their son Lives in: House/apartment Stairs: Yes: Internal: 1 steps; none and External: 1 steps; none Has following equipment at home: Walker - 4 wheeled  PLOF: Independent  PATIENT GOALS: Get back to work. Cleaning around the house.   OBJECTIVE:  Note: Objective measures were completed at Evaluation unless otherwise noted.  DIAGNOSTIC FINDINGS:  10/01/23 MRI FINDINGS: Brain: Acute infarct in the right midbrain. Slight edema without mass effect. Age advanced T2/FLAIR hyperintensities in the white matter. No acute hemorrhage, mass lesion, midline shift or hydrocephalus. Vascular: Major arterial flow voids are maintained skull base. Skull and upper cervical spine: Normal marrow signal. Sinuses/Orbits: Negative.   COGNITION: Overall cognitive status: Within functional limits for tasks assessed   SENSATION: WFL  COORDINATION: Appears WFL for UE and LE   MUSCLE TONE: LLE: Within functional limits   POSTURE: rounded shoulders and forward head    UE and LE AROM grossly WNL throughout UE MMTs grossly WNL throughout  LOWER EXTREMITY ROM:     Active  Right Eval Left Eval  Hip flexion    Hip extension    Hip abduction    Hip adduction    Hip internal rotation    Hip external rotation    Knee flexion    Knee extension    Ankle dorsiflexion    Ankle plantarflexion    Ankle inversion    Ankle eversion     (Blank rows = not tested)  LOWER EXTREMITY MMT:    MMT Right Eval Left Eval Right 11/24/22 Right 11/27/23  Hip  flexion 4+ 4+    Hip extension      Hip abduction    4+  Hip adduction      Hip internal rotation      Hip external rotation      Knee flexion (sitting) 4 4 4+ Prone 5  Knee extension 5 5  5   Ankle dorsiflexion 4 5 4+   Ankle plantarflexion  Ankle inversion      Ankle eversion      (Blank rows = not tested)  BED MOBILITY:  Patient reports no issues with bed mobility  TRANSFERS: Assistive device utilized: None  Sit to stand: Complete Independence Stand to sit: Complete Independence Chair to chair:  Not tested; no issue reported Floor:  Not tested; no issue reported  RAMP:  Level of Assistance: Modified independence Assistive device utilized: Walker - 4 wheeled and None Ramp Comments:   CURB:  Level of Assistance: SBA Assistive device utilized: None Curb Comments: Patient mentions having to go slow and be intentional   GAIT: Gait pattern: WFL, step through pattern, and decreased stride length Distance walked: 500 feet in clinic Assistive device utilized: Walker - 4 wheeled Level of assistance: Modified independence Comments:   FUNCTIONAL TESTS:  5 times sit to stand: 12 second 2 minute walk test: 349 feet Single leg balance: Right: 4 sec; Left: 6 sec  PATIENT SURVEYS:  FOTO 59  TODAY'S TREATMENT:                                                                                                                              DATE:  11/27/23 Elliptical x 3' dynamic warm up MMT's see above   11/25/2023 Elliptical x 3' dynamic warm up Nustep level 5 x 3' dynamic warm up Mini progress note  FOTO 82 5 time sit to stand 10.08 sec SLS 15  each Wall slides against door 2 x 10 Standing against door dorsiflexion bilaterally 2 x 10 Physioball red sitting march, knee extension and toe raises x 8 each with CGA for safety Hamstring curls Cybex 4 plates both concentric and right only eccentric    11/20/2023 Elliptical x 2' dynamic warm up Nustep level 5 x 3' dynamic  warm up Cybex hamstring curls 4 plates 2 x 10 Bodycraft leg press 4 plates 2 x 10 both, 3 plates right leg only x 15 reps Heel raise on step x 20 8 step ups with Heisman opposite knee 2 x 10 each no UE assist Foam balance beam walking down and back x 2; heel/toe walking on foam balance beam x 2; CGA for safety Tall hurdles x 4 step overs with tidal tank down and back x 4 reps; CGA for safety   11/18/23 Gait training in gym  Nustep seat 5 x 6' level 4 dynamic warm up Heel raises on incline x 20 no UE assist Toe raises on decline x 20 no UE assist Tandem stance on foam 2 x 30 each no UE assist Tandem stance holding tidal tank x 30 each Sidestepping with tidal tank 10 ft x 4 reps each Sit to stand with tidal tank 2 x 10; 2nd round with 3 sec eccentric sit BOSU ball standing (ball inverted, standing on flat surface) standing x 30 then squats 2 x 10 with no UE assist; CGA for safety  2 MWT 470 ft  SLS  15 each   11/14/23 2 laps around PT gym no AD; focus on improved heel strike and toe off Nustep seat 5 x 5' level 2  Heel raises on incline 2 x 10 Toe raises on decline 2 x 10 Sit to stand on foam with left foot advanced and holding yellow med ball 3 x 10 Alternating lunge on BOSU 2 x 10 no UE assist Leg press 4 plates both and 2 plates right only 2 x 10 each     10/21/2023: Initial Evaluation and initiated HEP  PATIENT EDUCATION: Education details: Patient educated on exam findings, POC, scope of PT, HEP, and PT prognosis. Person educated: Patient Education method: Explanation, Demonstration, and Handouts Education comprehension: verbalized understanding, returned demonstration, verbal cues required, and tactile cues required   HOME EXERCISE PROGRAM: [email or web address]  11/11/23 warrior 2 and heel/toe walking Access Code:   URL: [email or web address]  Date: 10/21/2023  Prepared by: AP  Rehab Exercises  Sit to Stand  - 2 x daily - 7 x weekly - 1 sets - 10 reps   Standing Tandem Balance with Counter Support  - 2 x daily - 7 x weekly - 1 sets - 3 reps - 30 sec hold  Standing March with Counter Support  - 2 x daily - 7 x weekly - 2 sets - 10 reps    10/28/23: - Side Stepping with Resistance at Thighs and Counter Support  - 2 x daily - 7 x weekly - 3 sets - 10 reps - Single Leg Stance  - 2 x daily - 7 x weekly - 1 sets - 5 reps - 5 hold    11/05/23: Vector stance  GOALS: Goals reviewed with patient? No  SHORT TERM GOALS: Target date: 11/11/2023  Patient will be independent with managing initial HEP to improve self management and self care.  Baseline: Goal status: met  2.  Patient will be able confident in standing and walking for >20 minutes without AD in order to tolerate grocery shopping. Baseline:  Goal status: met  LONG TERM GOALS: Target date: 12/02/2023  Patient will improve 2 MWT to 400 feet w/o AD in order to demonstrate improved functional ambulatory capacity in community setting and promote return to work. Baseline: 470 ft Goal status: met  2.  By discharge, patient will increase FOTO score by >10 points in order to improve functional capacity and tolerance to daily activity.  Baseline: 59; 11/25/23 82 Goal status: met  3.  Patient will be able confident in standing and walking for >45 minutes without AD in order to tolerate demands of returning to work. Baseline: walking 30 min twice a day Goal status: met  4.  Patient will demonstrate SLS > 15 sec bilaterally to improve balance and decrease risk of falls. Baseline: Right: 4 sec; Left: 6 sec; 11/18/23 15 sec; 11/25/23 15 each Goal status: met  5.  Patient will improve R LE knee flexion and ankle dorsiflexion to 4+/5 to decrease risk of falls and improve functional strength.  Baseline:  Goal status:in progress  ASSESSMENT:  CLINICAL IMPRESSION:   Discharge note today; patient has met all set rehab goals and is agreeable to discharge at this time and to safely resume all  prior activities.    Eval:  Patient is a 48 y.o. female who was seen today for physical therapy evaluation and treatment for a right midbrain infarction. 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  She's been able to navigate her home without the use of her RW but takes it any time she's out in the community. Functional tests (5x STS and ) reveal good physical ability for an acute stroke. Patient has little difficulty with any transfers. She still notices a right pulsion which is slightly observed during and while seated during the evaluation. Single leg stance is more difficult on the right leg. SLS on both sides resulted in a LOB to the right. LE MMTs also revealed slight right-sided weakness compared to the left. Throughout session, patient's speech was slow but fluent, not slurred. No difficulty with cognition, processing questions and commands.   OBJECTIVE IMPAIRMENTS: decreased balance, dizziness, and obesity.   ACTIVITY LIMITATIONS: caring for others  PARTICIPATION LIMITATIONS: cleaning, laundry, driving, shopping, and occupation  PERSONAL FACTORS: Transportation and 1-2 comorbidities: HTN, smoker  are also affecting patient's functional outcome.   REHAB POTENTIAL: Good  CLINICAL DECISION MAKING: Stable/uncomplicated  EVALUATION COMPLEXITY: Moderate  PLAN:  PT FREQUENCY: 1-2x/week  PT DURATION: 6 weeks  PLANNED INTERVENTIONS: 97164- PT Re-evaluation, 97110-Therapeutic exercises, 97530- Therapeutic activity, 97112- Neuromuscular re-education, 97535- Self Care, 02859- Manual therapy, [phone]- Gait training, Balance training, and Stair training  PLAN FOR NEXT SESSION: Functional strength and balance activities.  sees rehab MD 11/30/22   1:10 PM, 11/27/23 Jermy Couper Small Dennard Vezina MPT [place] [facility] [place] [phone]

## 2023-12-01 ENCOUNTER — Encounter: Payer: Self-pay | Admitting: Physical Medicine and Rehabilitation

## 2023-12-01 ENCOUNTER — Encounter: Payer: BC Managed Care – PPO | Attending: Registered Nurse | Admitting: Physical Medicine and Rehabilitation

## 2023-12-01 ENCOUNTER — Encounter: Payer: BC Managed Care – PPO | Admitting: Physical Medicine and Rehabilitation

## 2023-12-01 VITALS — BP 135/79 | HR 91 | Ht 62.0 in | Wt 158.0 lb

## 2023-12-01 DIAGNOSIS — E663 Overweight: Secondary | ICD-10-CM

## 2023-12-01 DIAGNOSIS — I639 Cerebral infarction, unspecified: Secondary | ICD-10-CM | POA: Diagnosis not present

## 2023-12-01 NOTE — Progress Notes (Signed)
 Subjective:    Patient ID: Melissa Wilkerson, female    DOB: [birth year], 48 y.o.   MRN: [id number]  HPI 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  yes do you drive?  no Do you have any goals in this area?  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  no  Physicians involved in your care Any changes since last visit?  no   Family History  Problem Relation Age of Onset   Cancer Mother        lung   Diabetes Mother    Hypertension Mother    Cancer Father        throat, tongue   Diabetes Maternal Grandmother    Hypertension Maternal Grandmother    Cancer Paternal Grandfather    Social History   Socioeconomic History   Marital status: Married    Spouse name: Not on file   Number of children: Not on file   Years of education: Not on file   Highest education level: Not on file  Occupational History   Not on file  Tobacco Use   Smoking status: Every Day    Current packs/day: 1.00    Average packs/day: 1 pack/day for 17.0 years (17.0 ttl pk-yrs)    Types: Cigarettes   Smokeless tobacco: Never  Vaping Use   Vaping status: Every Day  Substance and Sexual Activity   Alcohol use: No    Alcohol/week: 0.0 standard drinks of alcohol   Drug use: No   Sexual activity: Yes    Birth control/protection: Coitus  interruptus  Other Topics Concern   Not on file  Social History Narrative   Married for 15 years.Works at [employer] shift.   Social Drivers of Corporate Investment Banker Strain: Low Risk  (11/25/2023)   Overall Financial Resource Strain (CARDIA)    Difficulty of Paying Living Expenses: Not hard at all  Food Insecurity: No Food Insecurity (11/25/2023)   Hunger Vital Sign    Worried About Running Out of Food in the Last Year: Never true    Ran Out of Food in the Last Year: Never true  Transportation Needs: No Transportation Needs (11/25/2023)   PRAPARE - Administrator, Civil Service (Medical): No    Lack of Transportation (Non-Medical): No  Physical Activity: Insufficiently Active (11/25/2023)   Exercise Vital Sign    Days of Exercise per Week: 4 days    Minutes of Exercise per Session: 20 min  Stress: No Stress Concern Present (11/25/2023)   [employer] of Occupational Health - Occupational Stress Questionnaire    Feeling of Stress : Not at all  Social Connections: Moderately Isolated (11/25/2023)   Social Connection and Isolation Panel [NHANES]  Frequency of Communication with Friends and Family: More than three times a week    Frequency of Social Gatherings with Friends and Family: Once a week    Attends Religious Services: Never    Database Administrator or Organizations: No    Attends Banker Meetings: Never    Marital Status: Married   History reviewed. No pertinent surgical history. Past Medical History:  Diagnosis Date   Abdominal bloating 05/26/2015   Breast nodule 03/31/2015   Diabetes mellitus without complication (HCC)    Dyspareunia 05/26/2015   Essential hypertension, benign 09/28/2019   HLD (hyperlipidemia) 09/28/2019   Hypothyroidism, adult 09/28/2019   Vitamin D  deficiency disease 09/28/2019   BP 135/79   Pulse 91   Ht 5' 2 (1.575 m)   Wt 158 lb (71.7 kg)   LMP 11/10/2023 (Approximate)   SpO2 95%   BMI 28.90 kg/m    Opioid Risk Score:   Fall Risk Score:  `1  Depression screen [facility] 2/9     12/01/2023   11:03 AM 11/25/2023   11:31 AM 10/21/2023    2:51 PM 10/20/2023    1:53 PM 12/01/2020   11:04 AM 09/20/2020   10:49 AM 02/09/2020    9:44 AM  Depression screen PHQ 2/9  Decreased Interest 0 0 0 0 0 0 0  Down, Depressed, Hopeless 0 0 0 0 1 0 0  PHQ - 2 Score 0 0 0 0 1 0 0  Altered sleeping  0  0 2 0   Tired, decreased energy  0  0 1 0   Change in appetite  0  0 1 0   Feeling bad or failure about yourself   0  0 0 0   Trouble concentrating  0  0 0 0   Moving slowly or fidgety/restless  0  0 0 0   Suicidal thoughts  0  0 0 0   PHQ-9 Score  0  0 5 0   Difficult doing work/chores      Not difficult at all     Review of Systems  Genitourinary:        Bladder leakage with cough  Neurological:  Positive for numbness.  All other systems reviewed and are negative.      Objective:   Physical Exam Gen: no distress, normal appearing HEENT: oral mucosa pink and moist, NCAT Cardio: Reg rate Chest: normal effort, normal rate of breathing Abd: soft, non-distended Ext: no edema Psych: pleasant, normal affect Skin: intact Neuro: Alert and oriented x3 MSK: 5/5 strength throughout     Assessment & Plan:   1) CVA -discussed that she completed her therapy -discussed return to work and provided a note -discussed return to driving -discussed natural sources of B12 and that if she chooses to continue taking a supplement, to take a methylated B12 or B complex -discussed that adequate B vitamins are protective against CVA -continue aspirin /lipitor  2) Overweight: -recommended avoidance of processed food -recommended to avoid white bread/pasta/rice -recommended consuming grass fed and pasture raised animal products when consuming meat -recommended tracking weight -discussed measuring abdominal circumference

## 2023-12-01 NOTE — Patient Instructions (Signed)
 B12 methylated Or B complex methylated

## 2023-12-02 ENCOUNTER — Ambulatory Visit (HOSPITAL_COMMUNITY): Payer: BC Managed Care – PPO

## 2023-12-04 ENCOUNTER — Ambulatory Visit (HOSPITAL_COMMUNITY): Payer: BC Managed Care – PPO

## 2023-12-09 ENCOUNTER — Inpatient Hospital Stay: Payer: Commercial Indemnity | Admitting: Diagnostic Neuroimaging

## 2024-01-13 ENCOUNTER — Telehealth (INDEPENDENT_AMBULATORY_CARE_PROVIDER_SITE_OTHER): Payer: Self-pay | Admitting: Primary Care

## 2024-01-13 NOTE — Telephone Encounter (Signed)
 Called pt to remind them about atp. Pt did not answer and VM was left.

## 2024-01-20 ENCOUNTER — Encounter (INDEPENDENT_AMBULATORY_CARE_PROVIDER_SITE_OTHER): Payer: Self-pay | Admitting: Primary Care

## 2024-01-20 ENCOUNTER — Ambulatory Visit (INDEPENDENT_AMBULATORY_CARE_PROVIDER_SITE_OTHER): Payer: BC Managed Care – PPO | Admitting: Primary Care

## 2024-01-20 VITALS — BP 131/79 | HR 71 | Temp 98.1°F | Resp 14 | Ht 62.0 in | Wt 146.8 lb

## 2024-01-20 DIAGNOSIS — E1165 Type 2 diabetes mellitus with hyperglycemia: Secondary | ICD-10-CM | POA: Diagnosis not present

## 2024-01-20 DIAGNOSIS — Z7984 Long term (current) use of oral hypoglycemic drugs: Secondary | ICD-10-CM | POA: Diagnosis not present

## 2024-01-20 LAB — POCT GLYCOSYLATED HEMOGLOBIN (HGB A1C): HbA1c, POC (controlled diabetic range): 5.7 % (ref 0.0–7.0)

## 2024-01-20 NOTE — Progress Notes (Unsigned)
 [facility]  Melissa Wilkerson, is a 48 y.o. female  WUX:[id number]  [id number]  DOB - 05-14-1976  Chief Complaint  Patient presents with   Follow-up       Subjective:   Melissa Wilkerson is a 48 y.o. female here today to show PCP that she had no longer needs a rollator she can walk by herself she no longer has right-sided weakness but she still has loss of sensation to hot and cold on that side.  She has completed physical therapy and back to work.  Patient has No headache, No chest pain, No abdominal pain - No Nausea, No new weakness tingling or numbness, No Cough - shortness of breath. T2D- She denies polyuria, polydipsia, polyphasia or vision changes.  Does not check blood sugars at home.  HPI  No problems updated.  Comprehensive ROS Pertinent positive and negative noted in HPI   No Known Allergies  Past Medical History:  Diagnosis Date   Abdominal bloating 05/26/2015   Breast nodule 03/31/2015   Diabetes mellitus without complication (HCC)    Dyspareunia 05/26/2015   Essential hypertension, benign 09/28/2019   HLD (hyperlipidemia) 09/28/2019   Hypothyroidism, adult 09/28/2019   Vitamin D deficiency disease 09/28/2019    Current Outpatient Medications on File Prior to Visit  Medication Sig Dispense Refill   acetaminophen (TYLENOL) 325 MG tablet Take 1-2 tablets (325-650 mg total) by mouth every 4 (four) hours as needed for mild pain (pain score 1-3).     amLODipine (NORVASC) 5 MG tablet Take 1 tablet (5 mg total) by mouth daily. 30 tablet 2   aspirin EC 81 MG tablet Take 1 tablet (81 mg total) by mouth daily. Swallow whole. 30 tablet 2   atorvastatin (LIPITOR) 80 MG tablet Take 1 tablet (80 mg total) by mouth daily. 90 tablet 3   blood glucose meter kit and supplies Use up to four times daily as directed 1 each 0   glucose blood test strip Use up to four times a day as directed 100 each 0   Lancets (ONETOUCH DELICA PLUS LANCET33G) MISC Use up to four  times a day as directed 100 each 0   losartan (COZAAR) 25 MG tablet Take 1 tablet (25 mg total) by mouth daily. 30 tablet 2   magnesium oxide (MAG-OX) 400 MG tablet Take 1 tablet (400 mg total) by mouth 2 (two) times daily. 60 tablet 2   metFORMIN (GLUCOPHAGE-XR) 750 MG 24 hr tablet Take 1 tablet (750 mg total) by mouth daily with supper. 30 tablet 2   L-Methylfolate-B6-B12 (ELFOLATE PLUS) 3-35-2 MG TABS Take 1 tablet by mouth daily at 2 PM. (Patient not taking: Reported on 11/25/2023) 30 tablet 0   No current facility-administered medications on file prior to visit.   Health Maintenance  Topic Date Due   Pneumococcal Vaccination (1 of 2 - PCV) Never done   Complete foot exam   Never done   Eye exam for diabetics  Never done   Yearly kidney health urinalysis for diabetes  Never done   Hepatitis C Screening  Never done   Colon Cancer Screening  Never done   COVID-19 Vaccine (3 - 2024-25 season) 07/20/2023   Flu Shot  02/16/2024*   Hemoglobin A1C  07/22/2024   Yearly kidney function blood test for diabetes  10/06/2024   Mammogram  10/29/2024   Pap with HPV screening  11/24/2028   DTaP/Tdap/Td vaccine (2 - Td or Tdap) 09/27/2029   HIV Screening  Completed  HPV Vaccine  Aged Out  *Topic was postponed. The date shown is not the original due date.    Objective:   Vitals:   01/20/24 1100  BP: 131/79  Pulse: 71  Resp: 14  Temp: 98.1 F (36.7 C)  TempSrc: Oral  SpO2: 100%  Weight: 146 lb 12.8 oz (66.6 kg)  Height: 5\' 2"  (1.575 m)    Physical Exam Vitals reviewed.  Constitutional:      Appearance: Normal appearance.  HENT:     Head: Normocephalic.     Right Ear: Tympanic membrane, ear canal and external ear normal.     Left Ear: Tympanic membrane, ear canal and external ear normal.     Nose: Nose normal.     Mouth/Throat:     Mouth: Mucous membranes are moist.  Eyes:     Extraocular Movements: Extraocular movements intact.     Pupils: Pupils are equal, round, and reactive  to light.  Cardiovascular:     Rate and Rhythm: Normal rate.  Pulmonary:     Effort: Pulmonary effort is normal.     Breath sounds: Normal breath sounds.  Abdominal:     General: Bowel sounds are normal.     Palpations: Abdomen is soft.  Musculoskeletal:        General: Normal range of motion.     Cervical back: Normal range of motion.  Skin:    General: Skin is warm and dry.  Neurological:     Mental Status: She is alert and oriented to person, place, and time.     Sensory: Sensory deficit present.     Comments: Right arm  Psychiatric:        Mood and Affect: Mood normal.        Behavior: Behavior normal.        Thought Content: Thought content normal.       Assessment & Plan  Jacklynn was seen today for follow-up.  Diagnoses and all orders for this visit:  Controlled type 2 diabetes mellitus with hyperglycemia, without long-term current use of insulin (HCC) -     POCT glycosylated hemoglobin (Hb A1C) 5.7  Patient stopped taking metformin when taking her readings at home CBGs 100 or less today A1c is 5.7.  We came to an agreement and understanding no medication lifestyle modification and exercise which includes decreasing carbohydrates rice, potatoes, breads, sweets, candies, cookies, tortillas, enchiladas, etc. monitoring processed foods and going to fast food restaurants.    Patient have been counseled extensively about nutrition and exercise. Other issues discussed during this visit include: low cholesterol diet, weight control and daily exercise, foot care, annual eye examinations at Ophthalmology, importance of adherence with medications and regular follow-up. We also discussed long term complications of uncontrolled diabetes and hypertension.   Return in about 3 months (around 04/21/2024) for A1C.  The patient was given clear instructions to go to ER or return to medical center if symptoms don't improve, worsen or new problems develop. The patient verbalized understanding.  The patient was told to call to get lab results if they haven't heard anything in the next week.   This note has been created with Education officer, environmental. Any transcriptional errors are unintentional.   Grayce Sessions, NP 01/20/2024, 11:31 AM

## 2024-01-25 ENCOUNTER — Other Ambulatory Visit (INDEPENDENT_AMBULATORY_CARE_PROVIDER_SITE_OTHER): Payer: Self-pay | Admitting: Primary Care

## 2024-01-25 DIAGNOSIS — E1165 Type 2 diabetes mellitus with hyperglycemia: Secondary | ICD-10-CM

## 2024-01-26 ENCOUNTER — Other Ambulatory Visit (INDEPENDENT_AMBULATORY_CARE_PROVIDER_SITE_OTHER): Payer: Self-pay

## 2024-01-26 MED ORDER — LOSARTAN POTASSIUM 25 MG PO TABS: 25.0000 mg | ORAL_TABLET | Freq: Every day | ORAL | 1 refills | Status: DC

## 2024-01-26 MED ORDER — AMLODIPINE BESYLATE 5 MG PO TABS: 5.0000 mg | ORAL_TABLET | Freq: Every day | ORAL | 1 refills | Status: DC

## 2024-01-27 ENCOUNTER — Encounter (INDEPENDENT_AMBULATORY_CARE_PROVIDER_SITE_OTHER): Payer: Self-pay

## 2024-01-28 ENCOUNTER — Ambulatory Visit (INDEPENDENT_AMBULATORY_CARE_PROVIDER_SITE_OTHER): Admitting: Internal Medicine

## 2024-01-28 ENCOUNTER — Encounter: Payer: Self-pay | Admitting: *Deleted

## 2024-01-28 ENCOUNTER — Ambulatory Visit: Admitting: Internal Medicine

## 2024-01-28 VITALS — BP 134/76 | HR 77 | Temp 98.7°F | Ht 62.0 in | Wt 146.6 lb

## 2024-01-28 DIAGNOSIS — Z1211 Encounter for screening for malignant neoplasm of colon: Secondary | ICD-10-CM

## 2024-01-28 NOTE — Patient Instructions (Signed)
We will schedule you for colonoscopy for colon cancer screening purposes.  It was very nice meeting you today.  Dr. Marletta Lor

## 2024-01-28 NOTE — Progress Notes (Signed)
 Primary Care Physician:  Grayce Sessions, NP Primary Gastroenterologist:  Dr. Marletta Lor  Chief Complaint  Patient presents with   New Patient (Initial Visit)    Pt referred for colonoscopy    HPI:   Melissa Wilkerson is a 48 y.o. female who presents the clinic today by referral from her PCP Gwinda Passe to discuss screening colonoscopy.  No prior colonoscopy.  No family history of colorectal malignancy.  No melena hematochezia.  No unintentional weight loss or abdominal pain.  Bowels moving well without complaints of constipation or diarrhea.  Denies any upper GI symptoms including heartburn, reflux, epigastric or chest pain, dysphagia or odynophagia.  Overall feels well from a GI standpoint.  Suffered an ischemic stroke in November 22024 which she has recovered well from.  Initially on ASA and Plavix, Plavix since discontinued, now taking ASA 81 mg daily.  Past Medical History:  Diagnosis Date   Abdominal bloating 05/26/2015   Breast nodule 03/31/2015   Diabetes mellitus without complication (HCC)    Dyspareunia 05/26/2015   Essential hypertension, benign 09/28/2019   HLD (hyperlipidemia) 09/28/2019   Hypothyroidism, adult 09/28/2019   Vitamin D deficiency disease 09/28/2019    No past surgical history on file.  Current Outpatient Medications  Medication Sig Dispense Refill   acetaminophen (TYLENOL) 325 MG tablet Take 1-2 tablets (325-650 mg total) by mouth every 4 (four) hours as needed for mild pain (pain score 1-3).     amLODipine (NORVASC) 5 MG tablet Take 1 tablet (5 mg total) by mouth daily. 90 tablet 1   aspirin EC 81 MG tablet Take 1 tablet (81 mg total) by mouth daily. Swallow whole. 30 tablet 2   cyanocobalamin (VITAMIN B12) 1000 MCG tablet Take 1,000 mcg by mouth daily.     losartan (COZAAR) 25 MG tablet Take 1 tablet (25 mg total) by mouth daily. 90 tablet 1   magnesium oxide (MAG-OX) 400 MG tablet Take 1 tablet (400 mg total) by mouth 2 (two) times daily. 60  tablet 2   atorvastatin (LIPITOR) 80 MG tablet Take 1 tablet (80 mg total) by mouth daily. 90 tablet 3   blood glucose meter kit and supplies Use up to four times daily as directed (Patient not taking: Reported on 01/28/2024) 1 each 0   glucose blood test strip Use up to four times a day as directed (Patient not taking: Reported on 01/28/2024) 100 each 0   L-Methylfolate-B6-B12 (ELFOLATE PLUS) 3-35-2 MG TABS Take 1 tablet by mouth daily at 2 PM. (Patient not taking: Reported on 01/28/2024) 30 tablet 0   Lancets (ONETOUCH DELICA PLUS LANCET33G) MISC Use up to four times a day as directed (Patient not taking: Reported on 01/28/2024) 100 each 0   metFORMIN (GLUCOPHAGE-XR) 750 MG 24 hr tablet Take 1 tablet (750 mg total) by mouth daily with supper. (Patient not taking: Reported on 01/28/2024) 30 tablet 2   No current facility-administered medications for this visit.    Allergies as of 01/28/2024   (No Known Allergies)    Family History  Problem Relation Age of Onset   Cancer Mother        lung   Diabetes Mother    Hypertension Mother    Cancer Father        throat, tongue   Diabetes Maternal Grandmother    Hypertension Maternal Grandmother    Cancer Paternal Grandfather     Social History   Socioeconomic History   Marital status: Married    Spouse  name: Not on file   Number of children: Not on file   Years of education: Not on file   Highest education level: Not on file  Occupational History   Not on file  Tobacco Use   Smoking status: Every Day    Current packs/day: 1.00    Average packs/day: 1 pack/day for 17.0 years (17.0 ttl pk-yrs)    Types: Cigarettes   Smokeless tobacco: Never  Vaping Use   Vaping status: Every Day  Substance and Sexual Activity   Alcohol use: No    Alcohol/week: 0.0 standard drinks of alcohol   Drug use: No   Sexual activity: Yes    Birth control/protection: Coitus interruptus  Other Topics Concern   Not on file  Social History Narrative    Married for 15 years.Works at [employer] shift.   Social Drivers of Corporate investment banker Strain: Low Risk  (11/25/2023)   Overall Financial Resource Strain (CARDIA)    Difficulty of Paying Living Expenses: Not hard at all  Food Insecurity: No Food Insecurity (11/25/2023)   Hunger Vital Sign    Worried About Running Out of Food in the Last Year: Never true    Ran Out of Food in the Last Year: Never true  Transportation Needs: No Transportation Needs (11/25/2023)   PRAPARE - Administrator, Civil Service (Medical): No    Lack of Transportation (Non-Medical): No  Physical Activity: Insufficiently Active (11/25/2023)   Exercise Vital Sign    Days of Exercise per Week: 4 days    Minutes of Exercise per Session: 20 min  Stress: No Stress Concern Present (11/25/2023)   [employer] of Occupational Health - Occupational Stress Questionnaire    Feeling of Stress : Not at all  Social Connections: Moderately Isolated (11/25/2023)   Social Connection and Isolation Panel [NHANES]    Frequency of Communication with Friends and Family: More than three times a week    Frequency of Social Gatherings with Friends and Family: Once a week    Attends Religious Services: Never    Database administrator or Organizations: No    Attends Banker Meetings: Never    Marital Status: Married  Catering manager Violence: Not At Risk (11/25/2023)   Humiliation, Afraid, Rape, and Kick questionnaire    Fear of Current or Ex-Partner: No    Emotionally Abused: No    Physically Abused: No    Sexually Abused: No    Subjective: Review of Systems  Constitutional:  Negative for chills and fever.  HENT:  Negative for congestion and hearing loss.   Eyes:  Negative for blurred vision and double vision.  Respiratory:  Negative for cough and shortness of breath.   Cardiovascular:  Negative for chest pain and palpitations.  Gastrointestinal:  Negative for abdominal pain, blood in  stool, constipation, diarrhea, heartburn, melena and vomiting.  Genitourinary:  Negative for dysuria and urgency.  Musculoskeletal:  Negative for joint pain and myalgias.  Skin:  Negative for itching and rash.  Neurological:  Negative for dizziness and headaches.  Psychiatric/Behavioral:  Negative for depression. The patient is not nervous/anxious.        Objective: BP 134/76   Pulse 77   Temp 98.7 F (37.1 C)   Ht 5\' 2"  (1.575 m)   Wt 146 lb 9.6 oz (66.5 kg)   LMP 01/26/2024   BMI 26.81 kg/m  Physical Exam Constitutional:      Appearance: Normal appearance.  HENT:  Head: Normocephalic and atraumatic.  Eyes:     Extraocular Movements: Extraocular movements intact.     Conjunctiva/sclera: Conjunctivae normal.  Cardiovascular:     Rate and Rhythm: Normal rate and regular rhythm.  Pulmonary:     Effort: Pulmonary effort is normal.     Breath sounds: Normal breath sounds.  Abdominal:     General: Bowel sounds are normal.     Palpations: Abdomen is soft.  Musculoskeletal:        General: No swelling. Normal range of motion.     Cervical back: Normal range of motion and neck supple.  Skin:    General: Skin is warm and dry.     Coloration: Skin is not jaundiced.  Neurological:     General: No focal deficit present.     Mental Status: She is alert and oriented to person, place, and time.  Psychiatric:        Mood and Affect: Mood normal.        Behavior: Behavior normal.      Assessment: *Colon cancer screening   Plan: Will schedule for screening colonoscopy.The risks including infection, bleed, or perforation as well as benefits, limitations, alternatives and imponderables have been reviewed with the patient. Questions have been answered. All parties agreeable.  Thank you Gwinda Passe for the kind referral.  01/28/2024 10:16 AM   Disclaimer: This note was dictated with voice recognition software. Similar sounding words can inadvertently be transcribed and  may not be corrected upon review.

## 2024-01-29 ENCOUNTER — Ambulatory Visit: Admitting: Internal Medicine

## 2024-02-02 MED ORDER — PEG 3350-KCL-NA BICARB-NACL 420 G PO SOLR
4000.0000 mL | Freq: Once | ORAL | 0 refills | Status: AC
Start: 2024-02-02 — End: 2024-02-02

## 2024-02-27 ENCOUNTER — Other Ambulatory Visit (HOSPITAL_COMMUNITY)
Admission: RE | Admit: 2024-02-27 | Discharge: 2024-02-27 | Disposition: A | Discharge status: Home / Self Care | Source: Ambulatory Visit | Attending: Internal Medicine | Admitting: Internal Medicine

## 2024-02-27 DIAGNOSIS — Z01818 Encounter for other preprocedural examination: Secondary | ICD-10-CM | POA: Insufficient documentation

## 2024-02-27 LAB — PREGNANCY, URINE: Preg Test, Ur: NEGATIVE

## 2024-03-02 ENCOUNTER — Encounter (HOSPITAL_COMMUNITY)
Admission: RE | Disposition: A | Discharge status: Home / Self Care | Payer: Self-pay | Source: Home / Self Care | Attending: Internal Medicine

## 2024-03-02 ENCOUNTER — Ambulatory Visit (HOSPITAL_COMMUNITY)
Admission: RE | Admit: 2024-03-02 | Discharge: 2024-03-02 | Disposition: A | Discharge status: Home / Self Care | Attending: Internal Medicine | Admitting: Internal Medicine

## 2024-03-02 ENCOUNTER — Other Ambulatory Visit: Payer: Self-pay

## 2024-03-02 ENCOUNTER — Encounter (HOSPITAL_COMMUNITY): Payer: Self-pay | Admitting: Internal Medicine

## 2024-03-02 ENCOUNTER — Ambulatory Visit (HOSPITAL_COMMUNITY): Admitting: Certified Registered"

## 2024-03-02 DIAGNOSIS — E119 Type 2 diabetes mellitus without complications: Secondary | ICD-10-CM | POA: Diagnosis not present

## 2024-03-02 DIAGNOSIS — F17290 Nicotine dependence, other tobacco product, uncomplicated: Secondary | ICD-10-CM | POA: Diagnosis not present

## 2024-03-02 DIAGNOSIS — E039 Hypothyroidism, unspecified: Secondary | ICD-10-CM | POA: Diagnosis not present

## 2024-03-02 DIAGNOSIS — Z1211 Encounter for screening for malignant neoplasm of colon: Secondary | ICD-10-CM | POA: Diagnosis not present

## 2024-03-02 DIAGNOSIS — F17210 Nicotine dependence, cigarettes, uncomplicated: Secondary | ICD-10-CM | POA: Insufficient documentation

## 2024-03-02 DIAGNOSIS — Z8673 Personal history of transient ischemic attack (TIA), and cerebral infarction without residual deficits: Secondary | ICD-10-CM | POA: Insufficient documentation

## 2024-03-02 DIAGNOSIS — K5730 Diverticulosis of large intestine without perforation or abscess without bleeding: Secondary | ICD-10-CM

## 2024-03-02 DIAGNOSIS — Z01811 Encounter for preprocedural respiratory examination: Secondary | ICD-10-CM

## 2024-03-02 DIAGNOSIS — Z01818 Encounter for other preprocedural examination: Secondary | ICD-10-CM

## 2024-03-02 DIAGNOSIS — I10 Essential (primary) hypertension: Secondary | ICD-10-CM | POA: Diagnosis not present

## 2024-03-02 HISTORY — PX: COLONOSCOPY: SHX5424

## 2024-03-02 HISTORY — DX: Cerebral infarction, unspecified: I63.9

## 2024-03-02 SURGERY — COLONOSCOPY
Anesthesia: General

## 2024-03-02 MED ORDER — LACTATED RINGERS IV SOLN: INTRAVENOUS | Status: DC | PRN

## 2024-03-02 MED ORDER — LIDOCAINE HCL (PF) 2 % IJ SOLN
INTRAMUSCULAR | Status: AC
Start: 2024-03-02 — End: ?
  Filled 2024-03-02: qty 5

## 2024-03-02 MED ORDER — PHENYLEPHRINE 80 MCG/ML (10ML) SYRINGE FOR IV PUSH (FOR BLOOD PRESSURE SUPPORT)
PREFILLED_SYRINGE | INTRAVENOUS | Status: AC
  Filled 2024-03-02: qty 10

## 2024-03-02 MED ORDER — PROPOFOL 500 MG/50ML IV EMUL
INTRAVENOUS | Status: DC | PRN
  Administered 2024-03-02: 150 ug/kg/min via INTRAVENOUS

## 2024-03-02 MED ORDER — LIDOCAINE HCL (PF) 2 % IJ SOLN
INTRAMUSCULAR | Status: DC | PRN
  Administered 2024-03-02: 60 mg via INTRADERMAL

## 2024-03-02 MED ORDER — PROPOFOL 10 MG/ML IV BOLUS
INTRAVENOUS | Status: DC | PRN
  Administered 2024-03-02: 70 mg via INTRAVENOUS

## 2024-03-02 NOTE — Anesthesia Procedure Notes (Signed)
 Date/Time: 03/02/2024 10:28 AM  Performed by: Juluis Ok, CRNAPre-anesthesia Checklist: Patient identified, Emergency Drugs available, Suction available, Patient being monitored and Timeout performed Patient Re-evaluated:Patient Re-evaluated prior to induction Oxygen Delivery Method: Nasal cannula

## 2024-03-02 NOTE — H&P (Signed)
 Primary Care Physician:  Grayce Sessions, NP Primary Gastroenterologist:  Dr. Marletta Lor  Pre-Procedure History & Physical: HPI:  Melissa Wilkerson is a 48 y.o. female is here for a colonoscopy for colon cancer screening purposes.   Past Medical History:  Diagnosis Date   Abdominal bloating 05/26/2015   Breast nodule 03/31/2015   Diabetes mellitus without complication (HCC)    Dyspareunia 05/26/2015   Essential hypertension, benign 09/28/2019   HLD (hyperlipidemia) 09/28/2019   Hypothyroidism, adult 09/28/2019   Stroke (HCC)    Vitamin D deficiency disease 09/28/2019    History reviewed. No pertinent surgical history.  Prior to Admission medications   Medication Sig Start Date End Date Taking? Authorizing Provider  acetaminophen (TYLENOL) 325 MG tablet Take 1-2 tablets (325-650 mg total) by mouth every 4 (four) hours as needed for mild pain (pain score 1-3). 10/09/23  Yes Setzer, Lynnell Jude, PA-C  amLODipine (NORVASC) 5 MG tablet Take 1 tablet (5 mg total) by mouth daily. 01/26/24  Yes Grayce Sessions, NP  aspirin EC 81 MG tablet Take 1 tablet (81 mg total) by mouth daily. Swallow whole. 10/21/23  Yes Grayce Sessions, NP  cyanocobalamin (VITAMIN B12) 1000 MCG tablet Take 1,000 mcg by mouth daily.   Yes [provider]  losartan (COZAAR) 25 MG tablet Take 1 tablet (25 mg total) by mouth daily. 01/26/24  Yes Grayce Sessions, NP  magnesium oxide (MAG-OX) 400 MG tablet Take 1 tablet (400 mg total) by mouth 2 (two) times daily. 10/21/23  Yes Grayce Sessions, NP  atorvastatin (LIPITOR) 80 MG tablet Take 1 tablet (80 mg total) by mouth daily. 10/22/23 01/20/24  Windell Norfolk, MD  blood glucose meter kit and supplies Use up to four times daily as directed Patient not taking: Reported on 01/28/2024 10/09/23   Milinda Antis, PA-C  glucose blood test strip Use up to four times a day as directed Patient not taking: Reported on 01/28/2024 10/09/23     L-Methylfolate-B6-B12  (ELFOLATE PLUS) 3-35-2 MG TABS Take 1 tablet by mouth daily at 2 PM. Patient not taking: Reported on 01/28/2024 10/09/23   Valetta Fuller, Lynnell Jude, PA-C  Lancets [facility] DELICA PLUS [place]) MISC Use up to four times a day as directed Patient not taking: Reported on 01/28/2024 10/09/23     metFORMIN (GLUCOPHAGE-XR) 750 MG 24 hr tablet Take 1 tablet (750 mg total) by mouth daily with supper. Patient not taking: Reported on 01/28/2024 10/21/23   Grayce Sessions, NP    Allergies as of 02/02/2024   (No Known Allergies)    Family History  Problem Relation Age of Onset   Cancer Mother        lung   Diabetes Mother    Hypertension Mother    Cancer Father        throat, tongue   Diabetes Maternal Grandmother    Hypertension Maternal Grandmother    Cancer Paternal Grandfather     Social History   Socioeconomic History   Marital status: Married    Spouse name: Not on file   Number of children: Not on file   Years of education: Not on file   Highest education level: Not on file  Occupational History   Not on file  Tobacco Use   Smoking status: Every Day    Current packs/day: 1.00    Average packs/day: 1 pack/day for 17.0 years (17.0 ttl pk-yrs)    Types: Cigarettes   Smokeless tobacco: Never  Vaping Use  Vaping status: Every Day  Substance and Sexual Activity   Alcohol use: No    Alcohol/week: 0.0 standard drinks of alcohol   Drug use: No   Sexual activity: Yes    Birth control/protection: Coitus interruptus  Other Topics Concern   Not on file  Social History Narrative   Married for 15 years.Works at [employer] shift.   Social Drivers of Corporate investment banker Strain: Low Risk  (11/25/2023)   Overall Financial Resource Strain (CARDIA)    Difficulty of Paying Living Expenses: Not hard at all  Food Insecurity: No Food Insecurity (11/25/2023)   Hunger Vital Sign    Worried About Running Out of Food in the Last Year: Never true    Ran Out of Food in the Last  Year: Never true  Transportation Needs: No Transportation Needs (11/25/2023)   PRAPARE - Administrator, Civil Service (Medical): No    Lack of Transportation (Non-Medical): No  Physical Activity: Insufficiently Active (11/25/2023)   Exercise Vital Sign    Days of Exercise per Week: 4 days    Minutes of Exercise per Session: 20 min  Stress: No Stress Concern Present (11/25/2023)   [employer] of Occupational Health - Occupational Stress Questionnaire    Feeling of Stress : Not at all  Social Connections: Moderately Isolated (11/25/2023)   Social Connection and Isolation Panel [NHANES]    Frequency of Communication with Friends and Family: More than three times a week    Frequency of Social Gatherings with Friends and Family: Once a week    Attends Religious Services: Never    Database administrator or Organizations: No    Attends Banker Meetings: Never    Marital Status: Married  Catering manager Violence: Not At Risk (11/25/2023)   Humiliation, Afraid, Rape, and Kick questionnaire    Fear of Current or Ex-Partner: No    Emotionally Abused: No    Physically Abused: No    Sexually Abused: No    Review of Systems: See HPI, otherwise negative ROS  Physical Exam: Vital signs in last 24 hours: Temp:  [98 F (36.7 C)] 98 F (36.7 C) (04/15 0944) Pulse Rate:  [70] 70 (04/15 0944) Resp:  [13] 13 (04/15 0944) BP: (135)/(62) 135/62 (04/15 0944) SpO2:  [100 %] 100 % (04/15 0944) Weight:  [63.5 kg] 63.5 kg (04/15 0944)   General:   Alert,  Well-developed, well-nourished, pleasant and cooperative in NAD Head:  Normocephalic and atraumatic. Eyes:  Sclera clear, no icterus.   Conjunctiva pink. Ears:  Normal auditory acuity. Nose:  No deformity, discharge,  or lesions. Msk:  Symmetrical without gross deformities. Normal posture. Extremities:  Without clubbing or edema. Neurologic:  Alert and  oriented x4;  grossly normal neurologically. Skin:  Intact without  significant lesions or rashes. Psych:  Alert and cooperative. Normal mood and affect.  Impression/Plan: Melissa Wilkerson is here for a colonoscopy to be performed for colon cancer screening purposes.  The risks of the procedure including infection, bleed, or perforation as well as benefits, limitations, alternatives and imponderables have been reviewed with the patient. Questions have been answered. All parties agreeable.

## 2024-03-02 NOTE — Addendum Note (Signed)
 Addendum  created 03/02/24 1136 by Juluis Ok, CRNA   Intraprocedure Event edited

## 2024-03-02 NOTE — Discharge Instructions (Addendum)

## 2024-03-02 NOTE — Anesthesia Preprocedure Evaluation (Signed)
 Anesthesia Evaluation  Patient identified by MRN, date of birth, ID band Patient awake    Airway Mallampati: II  TM Distance: >3 FB Neck ROM: Full    Dental no notable dental hx.    Pulmonary Current Smoker and Patient abstained from smoking.   Pulmonary exam normal breath sounds clear to auscultation       Cardiovascular Exercise Tolerance: Good hypertension,  Rhythm:Regular Rate:Normal     Neuro/Psych  PSYCHIATRIC DISORDERS      CVA, Residual Symptoms    GI/Hepatic negative GI ROS,,,  Endo/Other  diabetes, Well Controlled, Oral Hypoglycemic AgentsHypothyroidism    Renal/GU negative Renal ROS  negative genitourinary   Musculoskeletal negative musculoskeletal ROS (+)    Abdominal Normal abdominal exam  (+)   Peds  Hematology   Anesthesia Other Findings   Reproductive/Obstetrics negative OB ROS                              Anesthesia Physical Anesthesia Plan  ASA: 3  Anesthesia Plan: General   Post-op Pain Management:    Induction: Intravenous  PONV Risk Score and Plan: 0 and Propofol infusion  Airway Management Planned: Nasal Cannula  Additional Equipment:   Intra-op Plan:   Post-operative Plan:   Informed Consent: I have reviewed the patients History and Physical, chart, labs and discussed the procedure including the risks, benefits and alternatives for the proposed anesthesia with the patient or authorized representative who has indicated his/her understanding and acceptance.       Plan Discussed with: CRNA and Surgeon  Anesthesia Plan Comments:          Anesthesia Quick Evaluation

## 2024-03-02 NOTE — Transfer of Care (Signed)
 Immediate Anesthesia Transfer of Care Note  Patient: Melissa Wilkerson  Procedure(s) Performed: COLONOSCOPY  Patient Location: Endoscopy Unit  Anesthesia Type:General  Level of Consciousness: awake and patient cooperative  Airway & Oxygen Therapy: Patient Spontanous Breathing  Post-op Assessment: Report given to RN and Post -op Vital signs reviewed and stable  Post vital signs: Reviewed and stable  Last Vitals:  Vitals Value Taken Time  BP 87/44 03/02/24 1048  Temp 36.5 C 03/02/24 1048  Pulse 70 03/02/24 1048  Resp 12 03/02/24 1048  SpO2 100 % 03/02/24 1048    Last Pain:  Vitals:   03/02/24 1048  TempSrc: Oral  PainSc: 0-No pain      Patients Stated Pain Goal: 4 (03/02/24 0944)  Complications: No notable events documented.

## 2024-03-02 NOTE — Anesthesia Postprocedure Evaluation (Signed)
 Anesthesia Post Note  Patient: Melissa Wilkerson  Procedure(s) Performed: COLONOSCOPY  Patient location during evaluation: PACU Anesthesia Type: General Level of consciousness: awake and alert Pain management: pain level controlled Vital Signs Assessment: post-procedure vital signs reviewed and stable Respiratory status: spontaneous breathing, nonlabored ventilation, respiratory function stable and patient connected to nasal cannula oxygen Cardiovascular status: stable and blood pressure returned to baseline Postop Assessment: no apparent nausea or vomiting Anesthetic complications: no  No notable events documented.   Last Vitals:  Vitals:   03/02/24 1048 03/02/24 1050  BP: (!) 87/44 100/60  Pulse: 70 80  Resp: 12 15  Temp: 36.5 C   SpO2: 100% 100%    Last Pain:  Vitals:   03/02/24 1048  TempSrc: Oral  PainSc: 0-No pain                 Beacher [place]

## 2024-03-02 NOTE — Op Note (Signed)
 [facility] Patient Name: Melissa Wilkerson Procedure Date: 03/02/2024 10:19 AM MRN: [id number] Date of Birth: 07-May-1976 Attending MD: Rolando Cliche. Mordechai April , [place], [id number] CSN: [id number] Age: 48 Admit Type: Outpatient Procedure:                Colonoscopy Indications:              Screening for colorectal malignant neoplasm Providers:                Rolando Cliche. Mordechai April, DO, Vonna Guardian, [place] Wilson,                            Technician, Theola Fitch Referring MD:              Medicines:                See the Anesthesia note for documentation of the                            administered medications Complications:            No immediate complications. Estimated Blood Loss:     Estimated blood loss: none. Procedure:                Pre-Anesthesia Assessment:                           - The anesthesia plan was to use monitored                            anesthesia care (MAC).                           After obtaining informed consent, the colonoscope                            was passed under direct vision. Throughout the                            procedure, the patient's blood pressure, pulse, and                            oxygen saturations were monitored continuously. The                            [id number]-HQ190L ([id number]) scope was introduced through                            the anus and advanced to the the cecum, identified                            by appendiceal orifice and ileocecal valve. The                            colonoscopy was performed without difficulty. The                            patient tolerated the procedure well.  The quality                            of the bowel preparation was evaluated using the                            BBPS [facility] Bowel Preparation Scale) with scores                            of: Right Colon = 3, Transverse Colon = 3 and Left                            Colon = 3 (entire mucosa seen well with no residual                             staining, small fragments of stool or opaque                            liquid). The total BBPS score equals 9. Scope In: 10:32:50 AM Scope Out: 10:43:17 AM Scope Withdrawal Time: 0 hours 6 minutes 30 seconds  Total Procedure Duration: 0 hours 10 minutes 27 seconds  Findings:      A few small-mouthed diverticula were found in the ascending colon.      The exam was otherwise without abnormality on direct and retroflexion       views. Impression:               - Diverticulosis in the ascending colon.                           - The examination was otherwise normal on direct                            and retroflexion views.                           - No specimens collected. Moderate Sedation:      Per Anesthesia Care Recommendation:           - Patient has a contact number available for                            emergencies. The signs and symptoms of potential                            delayed complications were discussed with the                            patient. Return to normal activities tomorrow.                            Written discharge instructions were provided to the                            patient.                           -  Resume previous diet.                           - Continue present medications.                           - Repeat colonoscopy in 10 years for screening                            purposes.                           - Return to GI clinic PRN. Procedure Code(s):        --- Professional ---                           Z6109, Colorectal cancer screening; colonoscopy on                            individual not meeting criteria for high risk Diagnosis Code(s):        --- Professional ---                           Z12.11, Encounter for screening for malignant                            neoplasm of colon                           K57.30, Diverticulosis of large intestine without                            perforation or abscess without bleeding CPT  copyright 2022 American Medical Association. All rights reserved. The codes documented in this report are preliminary and upon coder review may  be revised to meet current compliance requirements. Rolando Cliche. Mordechai April, DO Rolando Cliche. Mordechai April, DO 03/02/2024 10:45:46 AM This report has been signed electronically. Number of Addenda: 0

## 2024-03-03 ENCOUNTER — Encounter (HOSPITAL_COMMUNITY): Payer: Self-pay | Admitting: Internal Medicine

## 2024-04-20 ENCOUNTER — Telehealth (INDEPENDENT_AMBULATORY_CARE_PROVIDER_SITE_OTHER): Payer: Self-pay | Admitting: Primary Care

## 2024-04-20 NOTE — Telephone Encounter (Signed)
 Called pt to confirm appt. Pt did not answer and and could not leave VM.

## 2024-04-21 ENCOUNTER — Encounter (INDEPENDENT_AMBULATORY_CARE_PROVIDER_SITE_OTHER): Payer: Self-pay | Admitting: Primary Care

## 2024-04-21 ENCOUNTER — Ambulatory Visit (INDEPENDENT_AMBULATORY_CARE_PROVIDER_SITE_OTHER): Admitting: Primary Care

## 2024-04-21 VITALS — BP 134/70 | HR 69 | Resp 16 | Wt 140.4 lb

## 2024-04-21 DIAGNOSIS — I639 Cerebral infarction, unspecified: Secondary | ICD-10-CM | POA: Diagnosis not present

## 2024-04-21 DIAGNOSIS — E1165 Type 2 diabetes mellitus with hyperglycemia: Secondary | ICD-10-CM | POA: Diagnosis not present

## 2024-04-21 DIAGNOSIS — Z1159 Encounter for screening for other viral diseases: Secondary | ICD-10-CM

## 2024-04-21 DIAGNOSIS — E559 Vitamin D deficiency, unspecified: Secondary | ICD-10-CM | POA: Diagnosis not present

## 2024-04-21 DIAGNOSIS — I10 Essential (primary) hypertension: Secondary | ICD-10-CM

## 2024-04-21 DIAGNOSIS — E039 Hypothyroidism, unspecified: Secondary | ICD-10-CM | POA: Diagnosis not present

## 2024-04-21 DIAGNOSIS — E119 Type 2 diabetes mellitus without complications: Secondary | ICD-10-CM

## 2024-04-21 DIAGNOSIS — Z9189 Other specified personal risk factors, not elsewhere classified: Secondary | ICD-10-CM

## 2024-04-21 MED ORDER — AMLODIPINE BESYLATE 5 MG PO TABS
5.0000 mg | ORAL_TABLET | Freq: Every day | ORAL | 1 refills | Status: AC
Start: 2024-04-21 — End: ?

## 2024-04-21 MED ORDER — LOSARTAN POTASSIUM 25 MG PO TABS
25.0000 mg | ORAL_TABLET | Freq: Every day | ORAL | 1 refills | Status: AC
Start: 2024-04-21 — End: ?

## 2024-04-21 NOTE — Progress Notes (Unsigned)
 [facility]  Melissa Wilkerson, is a 48 y.o. female  NGE:[id number]  MWN:[id number]  DOB - 03-15-1976  No chief complaint on file.      Subjective:   Melissa Wilkerson is a 48 y.o. female here today for a follow up visit for HTN. Patient has No headache, No chest pain, No abdominal pain - No Nausea, No new weakness tingling or numbness, No Cough - shortness of breath  No problems updated.  Comprehensive ROS Pertinent positive and negative noted in HPI   No Known Allergies  Past Medical History:  Diagnosis Date   Abdominal bloating 05/26/2015   Breast nodule 03/31/2015   Diabetes mellitus without complication (HCC)    Dyspareunia 05/26/2015   Essential hypertension, benign 09/28/2019   HLD (hyperlipidemia) 09/28/2019   Hypothyroidism, adult 09/28/2019   Stroke (HCC)    Vitamin D  deficiency disease 09/28/2019    Current Outpatient Medications on File Prior to Visit  Medication Sig Dispense Refill   acetaminophen  (TYLENOL ) 325 MG tablet Take 1-2 tablets (325-650 mg total) by mouth every 4 (four) hours as needed for mild pain (pain score 1-3).     aspirin  EC 81 MG tablet Take 1 tablet (81 mg total) by mouth daily. Swallow whole. 30 tablet 2   blood glucose meter kit and supplies Use up to four times daily as directed 1 each 0   cyanocobalamin (VITAMIN B12) 1000 MCG tablet Take 1,000 mcg by mouth daily.     glucose blood test strip Use up to four times a day as directed 100 each 0   L-Methylfolate-B6-B12 (ELFOLATE PLUS) 3-35-2 MG TABS Take 1 tablet by mouth daily at 2 PM. 30 tablet 0   Lancets (ONETOUCH DELICA PLUS LANCET33G) MISC Use up to four times a day as directed 100 each 0   atorvastatin  (LIPITOR) 80 MG tablet Take 1 tablet (80 mg total) by mouth daily. 90 tablet 3   No current facility-administered medications on file prior to visit.   Health Maintenance  Topic Date Due   Eye exam for diabetics  Never done   Yearly kidney health urinalysis for diabetes   Never done   Hepatitis C Screening  Never done   Pneumococcal Vaccination (1 of 2 - PCV) Never done   COVID-19 Vaccine (3 - 2024-25 season) 07/20/2023   Flu Shot  06/18/2024   Hemoglobin A1C  07/22/2024   Yearly kidney function blood test for diabetes  10/06/2024   Mammogram  10/29/2024   Complete foot exam   04/21/2025   Pap with HPV screening  11/24/2028   DTaP/Tdap/Td vaccine (2 - Td or Tdap) 09/27/2029   Colon Cancer Screening  03/02/2034   HIV Screening  Completed   HPV Vaccine  Aged Out   Meningitis B Vaccine  Aged Out    Objective:   Vitals:   04/21/24 1135  BP: 134/70  Pulse: 69  Resp: 16  SpO2: 100%  Weight: 140 lb 6.4 oz (63.7 kg)   BP Readings from Last 3 Encounters:  04/21/24 134/70  03/02/24 100/60  01/28/24 134/76   Physical Exam Vitals reviewed.  Constitutional:      Appearance: Normal appearance.  HENT:     Head: Normocephalic.     Right Ear: Tympanic membrane, ear canal and external ear normal.     Left Ear: Tympanic membrane, ear canal and external ear normal.     Nose: Nose normal.     Mouth/Throat:     Mouth: Mucous membranes are moist.  Eyes:     Extraocular Movements: Extraocular movements intact.     Pupils: Pupils are equal, round, and reactive to light.  Cardiovascular:     Rate and Rhythm: Normal rate.  Pulmonary:     Effort: Pulmonary effort is normal.     Breath sounds: Normal breath sounds.  Abdominal:     General: Bowel sounds are normal.     Palpations: Abdomen is soft.  Musculoskeletal:        General: Normal range of motion.     Cervical back: Normal range of motion.  Skin:    General: Skin is warm and dry.  Neurological:     Mental Status: She is alert and oriented to person, place, and time.  Psychiatric:        Mood and Affect: Mood normal.        Behavior: Behavior normal.        Thought Content: Thought content normal.    Assessment & Plan   Diagnoses and all orders for this visit:  Vitamin D  deficiency  disease -     VITAMIN D  25 Hydroxy (Vit-D Deficiency, Fractures)  Hypothyroidism, adult  (Hx of) -     TSH + free T4  Essential hypertension, benign BP goal - met controlled < 130/80 DIET: Limit salt intake, read nutrition labels to check salt content, limit fried and high fatty foods  Avoid using multisymptom OTC cold preparations that generally contain sudafed which can rise BP. Consult with pharmacist on best cold relief products to use for persons with HTN EXERCISE Discussed incorporating exercise such as walking - 30 minutes most days of the week and can do in 10 minute intervals    -     CMP14+EGFR -     losartan  (COZAAR ) 25 MG tablet; Take 1 tablet (25 mg total) by mouth daily. -     amLODipine  (NORVASC ) 5 MG tablet; Take 1 tablet (5 mg total) by mouth daily.  Cerebrovascular accident (CVA), unspecified mechanism (HCC) -     Lipid panel  Controlled type 2 diabetes mellitus with hyperglycemia, without long-term current use of insulin  (HCC) - educated on lifestyle modifications, including but not limited to diet choices and adding exercise to daily routine.   -     CBC with Differential/Platelet -     Microalbumin / creatinine urine ratio  Encounter for HCV screening test for high risk patient -     HCV Ab w Reflex to Quant PCR  Comprehensive diabetic foot examination, type 2 DM, encounter for [facility]) Title   Diabetic Foot Exam - detailed Date & Time: 04/21/2024 12:06 PM Diabetic Foot exam was performed with the following findings: Yes  Visual Foot Exam completed.: Yes  Is there a history of foot ulcer?: No Is there a foot ulcer now?: No Is there swelling?: No Is there elevated skin temperature?: No Is there abnormal foot shape?: No Is there a claw toe deformity?: No Are the toenails long?: No Are the toenails thick?: Yes Are the toenails ingrown?: No Is the skin thin, fragile, shiny and hairless?: No Normal Range of Motion?: Yes Is there foot or ankle muscle weakness?:  No Do you have pain in calf while walking?: No Are the shoes appropriate in style and fit?: Yes Can the patient see the bottom of their feet?: Yes Pulse Foot Exam completed.: Yes   Right Dorsalis Pedis: Diminished Left Dorsalis Pedis: Diminished     Sensory Foot Exam Completed.: Yes Semmes-Weinstein Monofilament Test + means  has sensation and - means no sensation  R Foot Test Control: Pos L Foot Test Control: Pos   R Site 1-Great Toe: Pos L Site 1-Great Toe: Pos   R Site 4: Pos L Site 4: Pos   R site 5: Pos L Site 5: Pos  R Site 6: Pos L Site 6: Pos     Image components are not supported.   Image components are not supported. Image components are not supported.  Tuning [place] Right vibratory: present Left vibratory: present  Comments        Patient have been counseled extensively about nutrition and exercise. Other issues discussed during this visit include: low cholesterol diet, weight control and daily exercise, foot care, annual eye examinations at Ophthalmology, importance of adherence with medications and regular follow-up. We also discussed long term complications of uncontrolled diabetes and hypertension.   No follow-ups on file.  The patient was given clear instructions to go to ER or return to medical center if symptoms don't improve, worsen or new problems develop. The patient verbalized understanding. The patient was told to call to get lab results if they haven't heard anything in the next week.   This note has been created with Education officer, environmental. Any transcriptional errors are unintentional.   Marius Siemens, NP 04/21/2024, 12:13 PM

## 2024-04-21 NOTE — Patient Instructions (Signed)
 [facility] [address] [place], [place] [address] Phone: [phone]

## 2024-04-22 LAB — CMP14+EGFR

## 2024-04-22 LAB — LIPID PANEL

## 2024-04-22 LAB — SPECIMEN STATUS REPORT

## 2024-04-22 LAB — TSH+FREE T4

## 2024-04-23 ENCOUNTER — Ambulatory Visit (INDEPENDENT_AMBULATORY_CARE_PROVIDER_SITE_OTHER): Payer: Self-pay | Admitting: Primary Care

## 2024-04-23 LAB — LIPID PANEL

## 2024-04-23 LAB — TSH+FREE T4

## 2024-04-23 LAB — CBC WITH DIFFERENTIAL/PLATELET

## 2024-04-23 LAB — HCV AB W REFLEX TO QUANT PCR

## 2024-04-23 LAB — CMP14+EGFR

## 2024-04-23 LAB — MICROALBUMIN / CREATININE URINE RATIO: Creatinine, Urine: 30.3 mg/dL

## 2024-04-23 LAB — VITAMIN D 25 HYDROXY (VIT D DEFICIENCY, FRACTURES)

## 2024-07-21 ENCOUNTER — Telehealth (INDEPENDENT_AMBULATORY_CARE_PROVIDER_SITE_OTHER): Payer: Self-pay | Admitting: Primary Care

## 2024-07-21 NOTE — Telephone Encounter (Signed)
 Left VM with pt about their upcoming appt.

## 2024-07-22 ENCOUNTER — Encounter (INDEPENDENT_AMBULATORY_CARE_PROVIDER_SITE_OTHER): Payer: Self-pay | Admitting: Primary Care

## 2024-07-22 ENCOUNTER — Ambulatory Visit (INDEPENDENT_AMBULATORY_CARE_PROVIDER_SITE_OTHER): Admitting: Primary Care

## 2024-07-22 ENCOUNTER — Other Ambulatory Visit: Payer: Self-pay

## 2024-07-22 VITALS — BP 125/81 | HR 75 | Resp 19 | Ht 62.0 in | Wt 135.2 lb

## 2024-07-22 DIAGNOSIS — Z1159 Encounter for screening for other viral diseases: Secondary | ICD-10-CM

## 2024-07-22 DIAGNOSIS — I639 Cerebral infarction, unspecified: Secondary | ICD-10-CM

## 2024-07-22 DIAGNOSIS — E1165 Type 2 diabetes mellitus with hyperglycemia: Secondary | ICD-10-CM

## 2024-07-22 DIAGNOSIS — E559 Vitamin D deficiency, unspecified: Secondary | ICD-10-CM

## 2024-07-22 DIAGNOSIS — I10 Essential (primary) hypertension: Secondary | ICD-10-CM

## 2024-07-22 DIAGNOSIS — E039 Hypothyroidism, unspecified: Secondary | ICD-10-CM

## 2024-07-22 NOTE — Progress Notes (Signed)
 [facility]  Melissa Wilkerson, is a 48 y.o. female  RDW:[id number]  FMW:[id number]  DOB - 24-Nov-1975       Subjective:   Melissa Wilkerson is a 48 y.o. female here today for a follow up visit HTN. Patient has No headache, No chest pain, No abdominal pain - No Nausea, No new weakness tingling or numbness, No Cough - shortness of breath. T2D-Denies polyuria, polydipsia, polyphasia or vision changes.  Does not check blood sugars at home.  HPI  No problems updated.  Comprehensive ROS Pertinent positive and negative noted in HPI   No Known Allergies  Past Medical History:  Diagnosis Date   Abdominal bloating 05/26/2015   Breast nodule 03/31/2015   Diabetes mellitus without complication (HCC)    Dyspareunia 05/26/2015   Essential hypertension, benign 09/28/2019   HLD (hyperlipidemia) 09/28/2019   Hypothyroidism, adult 09/28/2019   Stroke (HCC)    Vitamin D  deficiency disease 09/28/2019    Current Outpatient Medications on File Prior to Visit  Medication Sig Dispense Refill   acetaminophen  (TYLENOL ) 325 MG tablet Take 1-2 tablets (325-650 mg total) by mouth every 4 (four) hours as needed for mild pain (pain score 1-3).     amLODipine  (NORVASC ) 5 MG tablet Take 1 tablet (5 mg total) by mouth daily. 90 tablet 1   aspirin  EC 81 MG tablet Take 1 tablet (81 mg total) by mouth daily. Swallow whole. 30 tablet 2   atorvastatin  (LIPITOR) 80 MG tablet Take 1 tablet (80 mg total) by mouth daily. 90 tablet 3   blood glucose meter kit and supplies Use up to four times daily as directed 1 each 0   cyanocobalamin (VITAMIN B12) 1000 MCG tablet Take 1,000 mcg by mouth daily.     glucose blood test strip Use up to four times a day as directed 100 each 0   L-Methylfolate-B6-B12 (ELFOLATE PLUS) 3-35-2 MG TABS Take 1 tablet by mouth daily at 2 PM. 30 tablet 0   Lancets (ONETOUCH DELICA PLUS LANCET33G) MISC Use up to four times a day as directed 100 each 0   losartan  (COZAAR ) 25 MG tablet  Take 1 tablet (25 mg total) by mouth daily. 90 tablet 1   No current facility-administered medications on file prior to visit.   Health Maintenance  Topic Date Due   Eye exam for diabetics  Never done   Pneumococcal Vaccine (1 of 2 - PCV) Never done   Hepatitis B Vaccine (1 of 3 - 19+ 3-dose series) Never done   COVID-19 Vaccine (3 - 2025-26 season) 07/19/2024   Hemoglobin A1C  07/22/2024   Flu Shot  02/15/2025*   Mammogram  10/29/2024   Yearly kidney health urinalysis for diabetes  04/21/2025   Complete foot exam   04/21/2025   Yearly kidney function blood test for diabetes  07/23/2025   Pap with HPV screening  11/24/2028   DTaP/Tdap/Td vaccine (2 - Td or Tdap) 09/27/2029   Colon Cancer Screening  03/02/2034   Hepatitis C Screening  Completed   HIV Screening  Completed   HPV Vaccine  Aged Out   Meningitis B Vaccine  Aged Out  *Topic was postponed. The date shown is not the original due date.    Objective:   Vitals:   07/22/24 1104  BP: 125/81  Pulse: 75  Resp: 19  SpO2: 99%  Weight: 135 lb 3.2 oz (61.3 kg)  Height: 5' 2 (1.575 m)   BP Readings from Last 3 Encounters:  07/22/24 125/81  04/21/24 134/70  03/02/24 100/60      Physical Exam Vitals reviewed.  Constitutional:      Appearance: Normal appearance. She is normal weight.  HENT:     Head: Normocephalic.     Right Ear: Tympanic membrane, ear canal and external ear normal.     Left Ear: Tympanic membrane, ear canal and external ear normal.     Nose: Nose normal.     Mouth/Throat:     Mouth: Mucous membranes are moist.  Eyes:     Extraocular Movements: Extraocular movements intact.     Pupils: Pupils are equal, round, and reactive to light.  Cardiovascular:     Rate and Rhythm: Normal rate.  Pulmonary:     Effort: Pulmonary effort is normal.     Breath sounds: Normal breath sounds.  Abdominal:     General: Bowel sounds are normal.     Palpations: Abdomen is soft.  Musculoskeletal:        General:  Normal range of motion.     Cervical back: Normal range of motion.  Skin:    General: Skin is warm and dry.  Neurological:     Mental Status: She is alert and oriented to person, place, and time.  Psychiatric:        Mood and Affect: Mood normal.        Behavior: Behavior normal.        Thought Content: Thought content normal.       Assessment & Plan  Diagnoses and all orders for this visit:  Controlled type 2 diabetes mellitus with hyperglycemia, without long-term current use of insulin  (HCC) -     Ambulatory referral to Ophthalmology  Essential hypertension, benign Well controlled  DIET: Limit salt intake, read nutrition labels to check salt content, limit fried and high fatty foods  Avoid using multisymptom OTC cold preparations that generally contain sudafed which can rise BP. Consult with pharmacist on best cold relief products to use for persons with HTN EXERCISE Discussed incorporating exercise such as walking - 30 minutes most days of the week and can do in 10 minute intervals         Patient have been counseled extensively about nutrition and exercise. Other issues discussed during this visit include: low cholesterol diet, weight control and daily exercise, foot care, annual eye examinations at Ophthalmology, importance of adherence with medications and regular follow-up. We also discussed long term complications of uncontrolled diabetes and hypertension.   Return in about 6 months (around 01/19/2025) for medical conditions.  The patient was given clear instructions to go to ER or return to medical center if symptoms don't improve, worsen or new problems develop. The patient verbalized understanding. The patient was told to call to get lab results if they haven't heard anything in the next week.   This note has been created with Education officer, environmental. Any transcriptional errors are unintentional.   Melissa SHAUNNA Bohr,  NP 07/26/2024, 12:22 AM

## 2024-07-23 DIAGNOSIS — E039 Hypothyroidism, unspecified: Secondary | ICD-10-CM | POA: Diagnosis not present

## 2024-07-23 DIAGNOSIS — E559 Vitamin D deficiency, unspecified: Secondary | ICD-10-CM | POA: Diagnosis not present

## 2024-07-23 DIAGNOSIS — E1165 Type 2 diabetes mellitus with hyperglycemia: Secondary | ICD-10-CM | POA: Diagnosis not present

## 2024-07-23 DIAGNOSIS — Z1159 Encounter for screening for other viral diseases: Secondary | ICD-10-CM | POA: Diagnosis not present

## 2024-07-23 DIAGNOSIS — Z9189 Other specified personal risk factors, not elsewhere classified: Secondary | ICD-10-CM | POA: Diagnosis not present

## 2024-07-23 DIAGNOSIS — I10 Essential (primary) hypertension: Secondary | ICD-10-CM | POA: Diagnosis not present

## 2024-07-23 DIAGNOSIS — I639 Cerebral infarction, unspecified: Secondary | ICD-10-CM | POA: Diagnosis not present

## 2024-07-24 LAB — CMP14+EGFR
ALT: 14 IU/L (ref 0–32)
AST: 12 IU/L (ref 0–40)
Albumin: 4.6 g/dL (ref 3.9–4.9)
Alkaline Phosphatase: 80 IU/L (ref 44–121)
BUN/Creatinine Ratio: 19 (ref 9–23)
BUN: 12 mg/dL (ref 6–24)
Bilirubin Total: 1 mg/dL (ref 0.0–1.2)
CO2: 22 mmol/L (ref 20–29)
Calcium: 9.4 mg/dL (ref 8.7–10.2)
Chloride: 101 mmol/L (ref 96–106)
Creatinine, Ser: 0.64 mg/dL (ref 0.57–1.00)
Globulin, Total: 2.5 g/dL (ref 1.5–4.5)
Glucose: 105 mg/dL — ABNORMAL HIGH (ref 70–99)
Potassium: 4.6 mmol/L (ref 3.5–5.2)
Sodium: 139 mmol/L (ref 134–144)
Total Protein: 7.1 g/dL (ref 6.0–8.5)
eGFR: 110 mL/min/1.73 (ref >59)

## 2024-07-24 LAB — LIPID PANEL
Chol/HDL Ratio: 2.7 ratio (ref 0.0–4.4)
Cholesterol, Total: 118 mg/dL (ref 100–199)
HDL: 44 mg/dL (ref >39)
LDL Chol Calc (NIH): 60 mg/dL (ref 0–99)
Triglycerides: 68 mg/dL (ref 0–149)
VLDL Cholesterol Cal: 14 mg/dL (ref 5–40)

## 2024-07-24 LAB — CBC WITH DIFFERENTIAL/PLATELET
Basophils Absolute: 0.1 x10E3/uL (ref 0.0–0.2)
Basos: 1 %
EOS (ABSOLUTE): 0.2 x10E3/uL (ref 0.0–0.4)
Eos: 3 %
Hematocrit: 41.7 % (ref 34.0–46.6)
Hemoglobin: 13.9 g/dL (ref 11.1–15.9)
Immature Grans (Abs): 0 x10E3/uL (ref 0.0–0.1)
Immature Granulocytes: 0 %
Lymphocytes Absolute: 1.6 x10E3/uL (ref 0.7–3.1)
Lymphs: 21 %
MCH: 31.3 pg (ref 26.6–33.0)
MCHC: 33.3 g/dL (ref 31.5–35.7)
MCV: 94 fL (ref 79–97)
Monocytes Absolute: 0.4 x10E3/uL (ref 0.1–0.9)
Monocytes: 6 %
Neutrophils Absolute: 5.2 x10E3/uL (ref 1.4–7.0)
Neutrophils: 69 %
Platelets: 214 x10E3/uL (ref 150–450)
RBC: 4.44 x10E6/uL (ref 3.77–5.28)
RDW: 12.6 % (ref 11.7–15.4)
WBC: 7.6 x10E3/uL (ref 3.4–10.8)

## 2024-07-24 LAB — HCV INTERPRETATION

## 2024-07-24 LAB — VITAMIN D 25 HYDROXY (VIT D DEFICIENCY, FRACTURES): Vit D, 25-Hydroxy: 29 ng/mL — ABNORMAL LOW (ref 30.0–100.0)

## 2024-07-24 LAB — TSH+FREE T4
Free T4: 1.24 ng/dL (ref 0.82–1.77)
TSH: 2.98 u[IU]/mL (ref 0.450–4.500)

## 2024-07-24 LAB — HCV AB W REFLEX TO QUANT PCR: HCV Ab: NONREACTIVE

## 2024-09-30 ENCOUNTER — Other Ambulatory Visit: Payer: Self-pay | Admitting: Neurology

## 2024-09-30 DIAGNOSIS — I639 Cerebral infarction, unspecified: Secondary | ICD-10-CM

## 2024-09-30 NOTE — Telephone Encounter (Signed)
 Last seen on 10/22/23 Follow up scheduled 10/22/23

## 2024-10-21 ENCOUNTER — Ambulatory Visit: Payer: BC Managed Care – PPO | Admitting: Neurology

## 2024-10-21 ENCOUNTER — Encounter: Payer: Self-pay | Admitting: Neurology

## 2024-10-21 VITALS — BP 139/84 | Ht 62.0 in | Wt 142.0 lb

## 2024-10-21 DIAGNOSIS — I639 Cerebral infarction, unspecified: Secondary | ICD-10-CM | POA: Diagnosis not present

## 2024-10-21 MED ORDER — ATORVASTATIN CALCIUM 40 MG PO TABS: 40.0000 mg | ORAL_TABLET | Freq: Every day | ORAL | 3 refills | Status: AC

## 2024-10-21 NOTE — Patient Instructions (Addendum)
 Decrease Lipitor to 40 mg daily  Continue your other medications  Continue to follow up with PCP  Return as needed

## 2024-10-21 NOTE — Progress Notes (Signed)
 [facility]  PATIENT: Melissa Wilkerson DOB: 15-Jan-1976  REQUESTING CLINICIAN: Celestia Rosaline SQUIBB, NP HISTORY FROM: Patient/Father and Chart review  REASON FOR VISIT: Left medullary stroke    HISTORICAL  CHIEF COMPLAINT:  Chief Complaint  Patient presents with   RM13/STROKE    Pt is here Alone. Pt states she has been stable since last appointment. Pt states she still has numbness  in her face on her right side, and numbness in her left arm.     INTERVAL HISTORY 10/21/2024: Patient presents today for follow up. She is alone. Last visit was a year ago, since then, he has been doing well, compliant with the medications, denies any side effects. She is still complaining of abnormal sensation on her right face that she described as numbness there is also left-sided abnormal sensation of cold or heat.  Denies any weakness, denies any fall, no other complaints, no other concerns.    HISTORY OF PRESENT ILLNESS:  This is a 48 year old woman past medical history of hypertension, hyperlipidemia, diabetes mellitus who is presenting after being admitted to hospital for a left medullary stroke.  Patient initially presented to the hospital after waking up with right-sided weakness, MRI Brain showed a left medullary stroke, stroke etiology likely small vessel disease.  Her angiogram also showed intracranial atherosclerosis.  Patient was started on DAPT, aspirin  and Plavix  for total of 21 days and started on atorvastatin  40.  She was also recommended for inpatient acute rehab.  She completed rehab, discharged home with outpatient rehab.  She has followed up, was told that she does not need OT but still need PT about twice a week.  Since discharge from the hospital, her right lower extremity weakness is improving, she is having other sensory deficit, cannot feel sharp pain or hot or cold on the left side of her body but light touch is good, there is also right lower extremity weakness.   She uses a walker for ambulation, denies any recent fall.   Hospital Course and Summary: 48 y/o female with a history of hypertension, tobacco abuse, and diabetes presenting with numbness on her right face and right hand up to her elbow that began at 7 AM on 10/01/2023.  The patient states that she had some numbness in her right foot up to her right knee about a week prior to this admission lasting  a few minutes.  She states that on and off for the past week she has noted that she has been  leaning to the right with ambulation.  However she denies any frank leg weakness or falling.  She has had some dizziness but denies any visual disturbance, syncope, chest pain, shortness of breath.  She has not had any fevers, chills, nausea, vomiting or diarrhea, abdominal pain.  Spouse reported that the patient speech has been slow, but there has been no dysarthria.  Because of her right-sided numbness and dizziness, the patient presented for further evaluation and treatment. In the ED, the patient was afebrile and hemodynamically stable with oxygen saturation 99% room air.  WBC 9.6, hemoglobin 15.2, platelets 243.  Sodium 132, potassium 3.6, bicarbonate 22, serum creatinine 0.57.  LFTs unremarkable.  UA was negative for pyuria.  Urine drug screen was negative.  MRI brain showed an acute infarct in the right medulla.  CTA head and neck was negative for LVO.  Showed moderate to severe stenosis of the P2 PCA.  The patient was started on aspirin  and Plavix .  She was admitted  for further evaluation and treatment of her stroke. Neurology was consulted and recommended DAPT x 3 weeks followed by ASA 81 mg  daily.  CT CAP was ordered by neurology with results discussed below.   PT recommended CIR with which patient agreed.   Assessment and Plan:   Acute ischemic stroke -Appreciate Neurology Consult>>hypercoagulable work up ordered as well as CT CAP to look for occult malignancy -PT/OT evaluation>>CIR -Speech therapy  eval appreciated -MRI brain--acute infarct right medulla -CTA H&N--negative LVO.  Moderate severe stenosis P2 PCA, moderate bilateral paraclinoid ICA stenosis.  Severe right vertebral artery stenosis -Echo--EF 65-70%, not optimal to eval WMA, no PFO, trivial MR -LDL--unable to calculate secondary to elevated triglycerides -HbA1C--7.4 -Antiplatelet--ASA 81 mg and plavix  75 mg daily x 3 weeks, then ASA 81 mg daily -plan 16 more days of DAPT starting 10/07/23 and then ASA 81 mg daily -discussed +lupus anticoagulant with Dr. Collis change in therapy for now.  Keep outpt follow up with neurology after d/c   Essential hypertension -not on any medications as an outpatient -Allowing for permissive hypertension temporarily -Plan to start antihypertensive medications at time of discharge>>started amlodipine  and losartan    Mixed hyperlipidemia -Unable to calculate LDL secondary to elevated triglycerides -Triglycerides 481 -Total cholesterol 241 -direct LDL 164 -started lipitor 40 mg daily    OTHER MEDICAL CONDITIONS: Hypertension, hyperlipidemia, Diabetes Mellitus    REVIEW OF SYSTEMS: Full 14 system review of systems performed and negative with exception of: As noted in the HPI   ALLERGIES: No Known Allergies  HOME MEDICATIONS: Outpatient Medications Prior to Visit  Medication Sig Dispense Refill   acetaminophen  (TYLENOL ) 325 MG tablet Take 1-2 tablets (325-650 mg total) by mouth every 4 (four) hours as needed for mild pain (pain score 1-3).     amLODipine  (NORVASC ) 5 MG tablet Take 1 tablet (5 mg total) by mouth daily. 90 tablet 1   aspirin  EC 81 MG tablet Take 1 tablet (81 mg total) by mouth daily. Swallow whole. 30 tablet 2   blood glucose meter kit and supplies Use up to four times daily as directed 1 each 0   cyanocobalamin (VITAMIN B12) 1000 MCG tablet Take 1,000 mcg by mouth daily.     glucose blood test strip Use up to four times a day as directed 100 each 0   Lancets  (ONETOUCH DELICA PLUS LANCET33G) MISC Use up to four times a day as directed 100 each 0   losartan  (COZAAR ) 25 MG tablet Take 1 tablet (25 mg total) by mouth daily. 90 tablet 1   atorvastatin  (LIPITOR) 80 MG tablet Take 1 tablet by mouth once daily 90 tablet 0   L-Methylfolate-B6-B12 (ELFOLATE PLUS) 3-35-2 MG TABS Take 1 tablet by mouth daily at 2 PM. (Patient not taking: Reported on 10/21/2024) 30 tablet 0   No facility-administered medications prior to visit.    PAST MEDICAL HISTORY: Past Medical History:  Diagnosis Date   Abdominal bloating 05/26/2015   Breast nodule 03/31/2015   Diabetes mellitus without complication (HCC)    Dyspareunia 05/26/2015   Essential hypertension, benign 09/28/2019   HLD (hyperlipidemia) 09/28/2019   Hypothyroidism, adult 09/28/2019   Stroke (HCC)    Vitamin D  deficiency disease 09/28/2019    PAST SURGICAL HISTORY: Past Surgical History:  Procedure Laterality Date   COLONOSCOPY N/A 03/02/2024   Procedure: COLONOSCOPY;  Surgeon: Cindie Carlin POUR, DO;  Location: [facility];  Service: Endoscopy;  Laterality: N/A;  11:00am, ok rm 1-2    FAMILY HISTORY:  Family History  Problem Relation Age of Onset   Cancer Mother        lung   Diabetes Mother    Hypertension Mother    Cancer Father        throat, tongue   Diabetes Maternal Grandmother    Hypertension Maternal Grandmother    Cancer Paternal Grandfather     SOCIAL HISTORY: Social History   Socioeconomic History   Marital status: Married    Spouse name: Not on file   Number of children: Not on file   Years of education: Not on file   Highest education level: Not on file  Occupational History   Not on file  Tobacco Use   Smoking status: Every Day    Current packs/day: 1.00    Average packs/day: 1 pack/day for 17.0 years (17.0 ttl pk-yrs)    Types: Cigarettes   Smokeless tobacco: Never  Vaping Use   Vaping status: Every Day  Substance and Sexual Activity   Alcohol use: No     Alcohol/week: 0.0 standard drinks of alcohol   Drug use: No   Sexual activity: Yes    Birth control/protection: Coitus interruptus  Other Topics Concern   Not on file  Social History Narrative   Married for 15 years.Works at [employer] shift.   Social Drivers of Corporate Investment Banker Strain: Low Risk  (11/25/2023)   Overall Financial Resource Strain (CARDIA)    Difficulty of Paying Living Expenses: Not hard at all  Food Insecurity: No Food Insecurity (11/25/2023)   Hunger Vital Sign    Worried About Running Out of Food in the Last Year: Never true    Ran Out of Food in the Last Year: Never true  Transportation Needs: No Transportation Needs (11/25/2023)   PRAPARE - Administrator, Civil Service (Medical): No    Lack of Transportation (Non-Medical): No  Physical Activity: Insufficiently Active (11/25/2023)   Exercise Vital Sign    Days of Exercise per Week: 4 days    Minutes of Exercise per Session: 20 min  Stress: No Stress Concern Present (11/25/2023)   [employer] of Occupational Health - Occupational Stress Questionnaire    Feeling of Stress : Not at all  Social Connections: Moderately Isolated (11/25/2023)   Social Connection and Isolation Panel    Frequency of Communication with Friends and Family: More than three times a week    Frequency of Social Gatherings with Friends and Family: Once a week    Attends Religious Services: Never    Database Administrator or Organizations: No    Attends Banker Meetings: Never    Marital Status: Married  Catering Manager Violence: Not At Risk (11/25/2023)   Humiliation, Afraid, Rape, and Kick questionnaire    Fear of Current or Ex-Partner: No    Emotionally Abused: No    Physically Abused: No    Sexually Abused: No    PHYSICAL EXAM  GENERAL EXAM/CONSTITUTIONAL: Vitals:  Vitals:   10/21/24 1108  BP: 139/84  Weight: 142 lb (64.4 kg)  Height: 5' 2 (1.575 m)   Body mass index is 25.97  kg/m. Wt Readings from Last 3 Encounters:  10/21/24 142 lb (64.4 kg)  07/22/24 135 lb 3.2 oz (61.3 kg)  04/21/24 140 lb 6.4 oz (63.7 kg)   Patient is in no distress; well developed, nourished and groomed; neck is supple  MUSCULOSKELETAL: Gait, strength, tone, movements noted in Neurologic exam below  NEUROLOGIC: MENTAL STATUS:  No data to display         awake, alert, oriented to person, place and time recent and remote memory intact normal attention and concentration language fluent, comprehension intact, naming intact fund of knowledge appropriate  CRANIAL NERVE:  2nd, 3rd, 4th, 6th - Visual fields full to confrontation, extraocular muscles intact, no nystagmus 5th - facial sensation symmetric 7th - facial strength symmetric 8th - hearing intact 9th - palate elevates symmetrically, uvula midline 11th - shoulder shrug symmetric 12th - tongue protrusion midline  MOTOR:  normal bulk and tone, full strength in the BUE, and BLE   SENSORY:  Decrease pinprick and temperature in the LUE and LLE, normal light touch, normal vibration  COORDINATION:  finger-nose-finger, fine finger movements normal  GAIT/STATION:  Normal gait   DIAGNOSTIC DATA (LABS, IMAGING, TESTING) - I reviewed patient records, labs, notes, testing and imaging myself where available.  Lab Results  Component Value Date   WBC 7.6 07/23/2024   HGB 13.9 07/23/2024   HCT 41.7 07/23/2024   MCV 94 07/23/2024   PLT 214 07/23/2024      Component Value Date/Time   NA 139 07/23/2024 1134   K 4.6 07/23/2024 1134   CL 101 07/23/2024 1134   CO2 22 07/23/2024 1134   GLUCOSE 105 (H) 07/23/2024 1134   GLUCOSE 150 (H) 10/07/2023 0615   BUN 12 07/23/2024 1134   CREATININE 0.64 07/23/2024 1134   CREATININE 0.69 03/13/2021 0000   CALCIUM  9.4 07/23/2024 1134   PROT 7.1 07/23/2024 1134   ALBUMIN 4.6 07/23/2024 1134   AST 12 07/23/2024 1134   ALT 14 07/23/2024 1134   ALKPHOS 80 07/23/2024 1134    BILITOT 1.0 07/23/2024 1134   GFRNONAA >60 10/07/2023 0615   GFRNONAA 107 03/13/2021 0000   GFRAA 124 03/13/2021 0000   Lab Results  Component Value Date   CHOL 118 07/23/2024   HDL 44 07/23/2024   LDLCALC 60 07/23/2024   LDLDIRECT 164 (H) 10/02/2023   TRIG 68 07/23/2024   CHOLHDL 2.7 07/23/2024   Lab Results  Component Value Date   HGBA1C 5.7 01/20/2024   No results found for: VITAMINB12 Lab Results  Component Value Date   TSH 2.980 07/23/2024    MRI Brain 10/01/2023 1. Acute infarct in the right midbrain. 2. Moderate T2/FLAIR hyperintensities in the white matter, which are age advanced. These most likely represent accelerated chronic microvascular ischemic disease, but chronic demyelination could have a similar appearance  CTA Head and Neck 10/01/2023 1. No emergent large vessel occlusion. 2. The left PCA is small with suspected superimposed moderate to severe stenosis of the P2 PCA which is irregular and diminutive. 3. Moderate bilateral paraclinoid ICA stenosis. 4. Severe right vertebral artery origin stenosis.  Echo  1. Left ventricular ejection fraction, by estimation, is 65 to 70%. The left ventricle has normal function. Left ventricular endocardial border not optimally defined to evaluate regional wall motion. Left ventricular diastolic parameters were normal.  2. Right ventricular systolic function is normal. The right ventricular size is normal. Tricuspid regurgitation signal is inadequate for assessing PA pressure.  3. The mitral valve is grossly normal. Trivial mitral valve regurgitation.  4. The aortic valve was not well visualized. Aortic valve regurgitation is not visualized. Aortic valve mean gradient measures 6.0 mmHg.  5. The inferior vena cava is normal in size with <50% respiratory variability, suggesting right atrial pressure of 8 mmHg.    ASSESSMENT AND PLAN  48 y.o. year old female with vascular  risk factors including hypertension, hyperlipidemia,  diabetes mellitus who is presenting for follow-up after left medullary stroke.  From the stroke she does have deficit of right facial numbness and left-sided intolerance to cold or heat.  Denies any weakness.  She is currently on aspirin  and Lipitor 40 and LDL was 60.  I have advised patient to decrease the Lipitor to 40 mg nightly and to continue her other medications.  She will continue to follow-up with PCP and return as needed.   1. Acute stroke of medulla oblongata (HCC)   2. Acute CVA (cerebrovascular accident) [facility]      Patient Instructions  Decrease Lipitor to 40 mg daily  Continue your other medications  Continue to follow up with PCP  Return as needed   No orders of the defined types were placed in this encounter.   Meds ordered this encounter  Medications   atorvastatin  (LIPITOR) 40 MG tablet    Sig: Take 1 tablet (40 mg total) by mouth daily.    Dispense:  90 tablet    Refill:  3    Return if symptoms worsen or fail to improve.   Pastor Falling, MD 10/21/2024, 11:45 AM  [facility] [address], [address] [place], [place] [address] [phone]

## 2025-01-19 ENCOUNTER — Ambulatory Visit (INDEPENDENT_AMBULATORY_CARE_PROVIDER_SITE_OTHER): Admitting: Primary Care
